# Patient Record
Sex: Male | Born: 1955 | Race: Black or African American | Hispanic: No | Marital: Married | Smoking: Former smoker
Health system: Southern US, Community
[De-identification: ages and names within clinical notes are randomized; demographics above are authoritative.]

## PROBLEM LIST (undated history)

## (undated) DIAGNOSIS — D649 Anemia, unspecified: Secondary | ICD-10-CM

## (undated) DIAGNOSIS — H401120 Primary open-angle glaucoma, left eye, stage unspecified: Secondary | ICD-10-CM

## (undated) DIAGNOSIS — M4712 Other spondylosis with myelopathy, cervical region: Secondary | ICD-10-CM

## (undated) DIAGNOSIS — R7303 Prediabetes: Secondary | ICD-10-CM

## (undated) DIAGNOSIS — G959 Disease of spinal cord, unspecified: Secondary | ICD-10-CM

## (undated) DIAGNOSIS — N390 Urinary tract infection, site not specified: Secondary | ICD-10-CM

## (undated) DIAGNOSIS — A419 Sepsis, unspecified organism: Secondary | ICD-10-CM

## (undated) DIAGNOSIS — B1920 Unspecified viral hepatitis C without hepatic coma: Secondary | ICD-10-CM

## (undated) DIAGNOSIS — I10 Essential (primary) hypertension: Secondary | ICD-10-CM

## (undated) DIAGNOSIS — M4012 Other secondary kyphosis, cervical region: Secondary | ICD-10-CM

## (undated) DIAGNOSIS — M47812 Spondylosis without myelopathy or radiculopathy, cervical region: Secondary | ICD-10-CM

## (undated) DIAGNOSIS — H409 Unspecified glaucoma: Secondary | ICD-10-CM

## (undated) DIAGNOSIS — E785 Hyperlipidemia, unspecified: Secondary | ICD-10-CM

## (undated) DIAGNOSIS — S129XXA Fracture of neck, unspecified, initial encounter: Secondary | ICD-10-CM

## (undated) DIAGNOSIS — T7840XA Allergy, unspecified, initial encounter: Secondary | ICD-10-CM

## (undated) DIAGNOSIS — E119 Type 2 diabetes mellitus without complications: Secondary | ICD-10-CM

## (undated) DIAGNOSIS — N400 Enlarged prostate without lower urinary tract symptoms: Secondary | ICD-10-CM

## (undated) DIAGNOSIS — Z87442 Personal history of urinary calculi: Secondary | ICD-10-CM

## (undated) HISTORY — PX: KNEE SURGERY: SHX244

## (undated) HISTORY — DX: Type 2 diabetes mellitus without complications: E11.9

## (undated) HISTORY — DX: Prediabetes: R73.03

## (undated) HISTORY — DX: Unspecified viral hepatitis C without hepatic coma: B19.20

## (undated) HISTORY — DX: Allergy, unspecified, initial encounter: T78.40XA

## (undated) HISTORY — DX: Unspecified glaucoma: H40.9

---

## 2000-02-20 HISTORY — PX: KNEE SURGERY: SHX244

## 2000-10-03 ENCOUNTER — Emergency Department (HOSPITAL_COMMUNITY): Admission: EM | Admit: 2000-10-03 | Discharge: 2000-10-04 | Payer: Self-pay | Admitting: Emergency Medicine

## 2011-11-28 LAB — HM DIABETES EYE EXAM

## 2013-04-23 DIAGNOSIS — E119 Type 2 diabetes mellitus without complications: Secondary | ICD-10-CM | POA: Insufficient documentation

## 2013-04-23 LAB — TSH: TSH: 0.94 u[IU]/mL (ref <=5.90)

## 2013-04-23 LAB — BASIC METABOLIC PANEL
BUN: 16 mg/dL (ref 4–21)
Creatinine: 0.9 mg/dL (ref 0.6–1.3)
Glucose: 91 mg/dL
Sodium: 141 mmol/L (ref 137–147)

## 2013-04-23 LAB — HEPATIC FUNCTION PANEL
ALT: 33 U/L (ref 10–40)
AST: 27 U/L (ref 14–40)
Alkaline Phosphatase: 51 U/L (ref 25–125)
Bilirubin, Total: 0.3 mg/dL

## 2013-04-23 LAB — CBC AND DIFFERENTIAL
NEUTROS ABS: 4 /uL
WBC: 7.9 10^3/mL

## 2013-04-23 LAB — PSA: PSA: 0.1

## 2013-08-11 LAB — LIPID PANEL
Cholesterol: 132 mg/dL (ref 0–200)
HDL: 43 mg/dL (ref 35–70)
LDL CALC: 69 mg/dL
Triglycerides: 101 mg/dL (ref 40–160)

## 2013-08-20 ENCOUNTER — Ambulatory Visit: Payer: Self-pay

## 2014-02-23 DIAGNOSIS — N400 Enlarged prostate without lower urinary tract symptoms: Secondary | ICD-10-CM | POA: Insufficient documentation

## 2014-02-23 DIAGNOSIS — I10 Essential (primary) hypertension: Secondary | ICD-10-CM | POA: Insufficient documentation

## 2014-02-23 DIAGNOSIS — B070 Plantar wart: Secondary | ICD-10-CM | POA: Insufficient documentation

## 2014-02-23 LAB — HEMOGLOBIN A1C: Hemoglobin A1C: 6.2

## 2014-04-20 ENCOUNTER — Emergency Department (HOSPITAL_COMMUNITY): Payer: Self-pay

## 2014-04-20 ENCOUNTER — Emergency Department (HOSPITAL_COMMUNITY)
Admission: EM | Admit: 2014-04-20 | Discharge: 2014-04-20 | Disposition: A | Discharge status: Home / Self Care | Payer: Self-pay | Attending: Emergency Medicine | Admitting: Emergency Medicine

## 2014-04-20 ENCOUNTER — Emergency Department: Payer: Self-pay | Admitting: Emergency Medicine

## 2014-04-20 ENCOUNTER — Encounter (HOSPITAL_COMMUNITY): Payer: Self-pay | Admitting: Emergency Medicine

## 2014-04-20 DIAGNOSIS — R5383 Other fatigue: Secondary | ICD-10-CM | POA: Insufficient documentation

## 2014-04-20 DIAGNOSIS — Z720 Tobacco use: Secondary | ICD-10-CM | POA: Insufficient documentation

## 2014-04-20 DIAGNOSIS — B9789 Other viral agents as the cause of diseases classified elsewhere: Secondary | ICD-10-CM

## 2014-04-20 DIAGNOSIS — J988 Other specified respiratory disorders: Secondary | ICD-10-CM

## 2014-04-20 DIAGNOSIS — J069 Acute upper respiratory infection, unspecified: Secondary | ICD-10-CM | POA: Insufficient documentation

## 2014-04-20 DIAGNOSIS — R531 Weakness: Secondary | ICD-10-CM | POA: Insufficient documentation

## 2014-04-20 DIAGNOSIS — I10 Essential (primary) hypertension: Secondary | ICD-10-CM | POA: Insufficient documentation

## 2014-04-20 HISTORY — DX: Essential (primary) hypertension: I10

## 2014-04-20 LAB — I-STAT CHEM 8, ED
BUN: 17 mg/dL (ref 6–23)
CREATININE: 1.2 mg/dL (ref 0.50–1.35)
Calcium, Ion: 1.19 mmol/L (ref 1.12–1.23)
Chloride: 101 mmol/L (ref 96–112)
Glucose, Bld: 137 mg/dL — ABNORMAL HIGH (ref 70–99)
HCT: 48 % (ref 39.0–52.0)
Hemoglobin: 16.3 g/dL (ref 13.0–17.0)
POTASSIUM: 3.6 mmol/L (ref 3.5–5.1)
Sodium: 139 mmol/L (ref 135–145)
TCO2: 23 mmol/L (ref 0–100)

## 2014-04-20 LAB — CBC WITH DIFFERENTIAL/PLATELET
Basophils Absolute: 0 10*3/uL (ref 0.0–0.1)
Basophils Relative: 0 % (ref 0–1)
EOS ABS: 0.2 10*3/uL (ref 0.0–0.7)
Eosinophils Relative: 2 % (ref 0–5)
HCT: 43.8 % (ref 39.0–52.0)
Hemoglobin: 14.7 g/dL (ref 13.0–17.0)
Lymphocytes Relative: 31 % (ref 12–46)
Lymphs Abs: 2.9 10*3/uL (ref 0.7–4.0)
MCH: 28.8 pg (ref 26.0–34.0)
MCHC: 33.6 g/dL (ref 30.0–36.0)
MCV: 85.7 fL (ref 78.0–100.0)
MONOS PCT: 7 % (ref 3–12)
Monocytes Absolute: 0.7 10*3/uL (ref 0.1–1.0)
NEUTROS ABS: 5.7 10*3/uL (ref 1.7–7.7)
NEUTROS PCT: 60 % (ref 43–77)
PLATELETS: 271 10*3/uL (ref 150–400)
RBC: 5.11 MIL/uL (ref 4.22–5.81)
RDW: 13.2 % (ref 11.5–15.5)
WBC: 9.5 10*3/uL (ref 4.0–10.5)

## 2014-04-20 MED ORDER — IBUPROFEN 600 MG PO TABS: 600.0000 mg | ORAL_TABLET | Freq: Four times a day (QID) | ORAL | Status: DC | PRN

## 2014-04-20 MED ORDER — DM-GUAIFENESIN ER 30-600 MG PO TB12: 1.0000 | ORAL_TABLET | Freq: Two times a day (BID) | ORAL | Status: DC

## 2014-04-20 MED ORDER — SALINE SPRAY 0.65 % NA SOLN: 1.0000 | NASAL | Status: DC | PRN

## 2014-04-20 MED ORDER — IBUPROFEN 200 MG PO TABS
600.0000 mg | ORAL_TABLET | Freq: Once | ORAL | Status: AC
  Administered 2014-04-20: 600 mg via ORAL
  Filled 2014-04-20: qty 3

## 2014-04-20 MED ORDER — GUAIFENESIN-CODEINE 100-10 MG/5ML PO SOLN
5.0000 mL | Freq: Once | ORAL | Status: AC
  Administered 2014-04-20: 5 mL via ORAL
  Filled 2014-04-20: qty 5

## 2014-04-20 NOTE — ED Provider Notes (Signed)
CSN: [id number]     Arrival date & time 04/20/14  1512 History   First MD Initiated Contact with Patient 04/20/14 1620     Chief Complaint  Patient presents with  . Generalized Body Aches     (Consider location/radiation/quality/duration/timing/severity/associated sxs/prior Treatment) HPI Pt is a 59yo male presenting to ED with c/o 4 days of "feeling bad" with associated body aches and productive cough with yellow sputum.  Pt states he started to feel bad last week, but yesterday he felt the worst with a coughing fit that caused him to have a sore throat and chest pain.  Chest pain has resolved, however, pt states he still feels fatigued and aching.  He tried Alkaseltzer yesterday w/o relief yesterday but does state he is feeling better today.  Reports using zyrtec and flonase daily for "haye fever" but states he feels sicker than his normal haye fever. Denies fever, chills, n/v/d. Denies sick contacts or recent travel. Reports having asthma as a child but has since grown out of it. He does not use an inhaler at home. No previous hx of CAD. No FH of CAD. No hx of DVT, PE, leg pain or swelling.  Pt is a current daily smoker.  Past Medical History  Diagnosis Date  . Hypertension    Past Surgical History  Procedure Laterality Date  . Knee surgery     No family history on file. History  Substance Use Topics  . Smoking status: Current Every Day Smoker  . Smokeless tobacco: Not on file  . Alcohol Use: No    Review of Systems  Constitutional: Positive for fatigue. Negative for fever, chills and appetite change.  HENT: Positive for congestion and sore throat. Negative for ear pain, trouble swallowing and voice change.   Respiratory: Positive for cough. Negative for shortness of breath and stridor.   Cardiovascular: Positive for chest pain ( with cough). Negative for palpitations and leg swelling.  Gastrointestinal: Negative for nausea, vomiting, abdominal pain, diarrhea and constipation.   Neurological: Positive for weakness ( generalized).  All other systems reviewed and are negative.     Allergies  Review of patient's allergies indicates not on file.  Home Medications   Prior to Admission medications   Medication Sig Start Date End Date Taking? Authorizing Provider  dextromethorphan-guaiFENesin (MUCINEX DM) 30-600 MG per 12 hr tablet Take 1 tablet by mouth 2 (two) times daily. 04/20/14   Noland Fordyce, PA-C  ibuprofen (ADVIL,MOTRIN) 600 MG tablet Take 1 tablet (600 mg total) by mouth every 6 (six) hours as needed. 04/20/14   Noland Fordyce, PA-C  sodium chloride (OCEAN) 0.65 % SOLN nasal spray Place 1 spray into both nostrils as needed for congestion. 04/20/14   Noland Fordyce, PA-C   BP 126/82 mmHg  Pulse 89  Temp(Src) 98.5 F (36.9 C) (Oral)  Resp 18  SpO2 100% Physical Exam  Constitutional: He appears well-developed and well-nourished.  Pt lying comfortably in exam bed, NAD. Non-toxic appearing.  HENT:  Head: Normocephalic and atraumatic.  Right Ear: Hearing, tympanic membrane, external ear and ear canal normal.  Left Ear: Hearing, tympanic membrane, external ear and ear canal normal.  Nose: Mucosal edema present. Right sinus exhibits no maxillary sinus tenderness and no frontal sinus tenderness. Left sinus exhibits no maxillary sinus tenderness and no frontal sinus tenderness.  Mouth/Throat: Uvula is midline and mucous membranes are normal. Posterior oropharyngeal erythema present. No oropharyngeal exudate or posterior oropharyngeal edema.  Eyes: Conjunctivae are normal. No scleral icterus.  Neck:  Normal range of motion. Neck supple.  No nuchal rigidity or meningeal signs.  Cardiovascular: Normal rate, regular rhythm and normal heart sounds.   Pulmonary/Chest: Effort normal and breath sounds normal. No respiratory distress. He has no wheezes. He has no rales. He exhibits no tenderness.  No respiratory distress, able to speak in full sentences w/o difficulty. Lungs:  CTAB  Abdominal: Soft. Bowel sounds are normal. He exhibits no distension and no mass. There is no tenderness. There is no rebound and no guarding.  Musculoskeletal: Normal range of motion.  Neurological: He is alert.  Skin: Skin is warm and dry.  Nursing note and vitals reviewed.   ED Course  Procedures (including critical care time) Labs Review Labs Reviewed  I-STAT CHEM 8, ED - Abnormal; Notable for the following:    Glucose, Bld 137 (*)    All other components within normal limits  CBC WITH DIFFERENTIAL/PLATELET    Imaging Review Dg Chest 2 View  04/20/2014   CLINICAL DATA:  Productive cough.  EXAM: CHEST  2 VIEW  COMPARISON:  None.  FINDINGS: The lungs are clear. Heart size is normal. No pneumothorax or pleural effusion.  IMPRESSION: No acute disease.   Electronically Signed   By: Inge Rise M.D.   On: 04/20/2014 16:36     EKG Interpretation None      MDM   Final diagnoses:  Viral respiratory illness   Pt presenting to ED with URI symptoms that started 4 days ago. Pt reports body aches, productive cough, and fatigue.  Chest pain and sore throat were present during coughing fit yesterday but has since resolved.  Pt appears well, non-toxic. Able to speak in full sentences. Lungs are clear. CXR: no acute disease. O2-100% on RA. PERC negative, Doubt ACS. No evidence of pneumonia or pneumothorax on CXR.  Pt is afebrile, no meningeal signs. Pt able to keep down PO fluids in ED. Will tx symptomatically for viral illness. Pt stable for discharge home. Work note provided. Advised pt to use acetaminophen and ibuprofen as needed for fever and pain. Rx: mucinex DM and ocean saline nasal spray. Encouraged rest and fluids. Advised to f/u with PCP at [facility] in 3-4 days for recheck of symptoms if not improving. Return precautions provided.  Pt verbalized understanding and agreement with tx plan.    Noland Fordyce, PA-C 04/20/14 [place], PA-C 04/20/14 1713  Pamella Pert, MD 04/22/14 1047

## 2014-04-20 NOTE — ED Notes (Addendum)
Pt st's he started feeling bad 4 days ago.  Pt c/o productive cough (yellow), body aches.  No elevated temp. Also c/o feeling fatigued.

## 2014-07-01 ENCOUNTER — Ambulatory Visit: Payer: Self-pay | Admitting: Ophthalmology

## 2015-06-17 DIAGNOSIS — I10 Essential (primary) hypertension: Secondary | ICD-10-CM

## 2015-06-17 DIAGNOSIS — B070 Plantar wart: Secondary | ICD-10-CM

## 2015-06-17 DIAGNOSIS — E119 Type 2 diabetes mellitus without complications: Secondary | ICD-10-CM

## 2015-06-17 DIAGNOSIS — N400 Enlarged prostate without lower urinary tract symptoms: Secondary | ICD-10-CM

## 2019-02-05 HISTORY — PX: CATARACT EXTRACTION W/ INTRAOCULAR LENS IMPLANT: SHX1309

## 2019-03-05 ENCOUNTER — Other Ambulatory Visit: Payer: Self-pay

## 2019-12-21 HISTORY — PX: BROW LIFT AND BLEPHAROPLASTY: SHX1271

## 2020-02-20 HISTORY — PX: JOINT REPLACEMENT: SHX530

## 2020-03-06 HISTORY — PX: ORIF FEMORAL NECK FRACTURE W/ DHS: SUR930

## 2020-03-06 HISTORY — PX: TOTAL HIP ARTHROPLASTY: SHX124

## 2020-10-25 ENCOUNTER — Other Ambulatory Visit: Payer: Self-pay | Admitting: Neurosurgery

## 2020-10-25 ENCOUNTER — Other Ambulatory Visit (HOSPITAL_COMMUNITY): Payer: Self-pay | Admitting: Neurosurgery

## 2020-10-25 DIAGNOSIS — M4802 Spinal stenosis, cervical region: Secondary | ICD-10-CM

## 2020-11-01 ENCOUNTER — Ambulatory Visit
Admission: RE | Admit: 2020-11-01 | Discharge: 2020-11-01 | Disposition: A | Discharge status: Home / Self Care | Payer: 59 | Source: Ambulatory Visit | Attending: Neurosurgery | Admitting: Neurosurgery

## 2020-11-01 ENCOUNTER — Other Ambulatory Visit: Payer: Self-pay

## 2020-11-01 DIAGNOSIS — M4802 Spinal stenosis, cervical region: Secondary | ICD-10-CM | POA: Diagnosis not present

## 2020-12-07 ENCOUNTER — Other Ambulatory Visit: Payer: Self-pay | Admitting: Neurosurgery

## 2020-12-13 HISTORY — PX: LITHOTRIPSY: SUR834

## 2020-12-15 ENCOUNTER — Other Ambulatory Visit
Admission: RE | Admit: 2020-12-15 | Discharge: 2020-12-15 | Disposition: A | Discharge status: Home / Self Care | Payer: 59 | Source: Ambulatory Visit | Attending: Neurosurgery | Admitting: Neurosurgery

## 2020-12-15 ENCOUNTER — Other Ambulatory Visit: Payer: Self-pay

## 2020-12-15 VITALS — BP 138/90 | HR 78 | Temp 97.6°F | Resp 16 | Ht 66.0 in | Wt 250.1 lb

## 2020-12-15 DIAGNOSIS — I10 Essential (primary) hypertension: Secondary | ICD-10-CM | POA: Diagnosis not present

## 2020-12-15 DIAGNOSIS — Z01810 Encounter for preprocedural cardiovascular examination: Secondary | ICD-10-CM | POA: Diagnosis not present

## 2020-12-15 DIAGNOSIS — Z01818 Encounter for other preprocedural examination: Secondary | ICD-10-CM | POA: Diagnosis not present

## 2020-12-15 DIAGNOSIS — Z01812 Encounter for preprocedural laboratory examination: Secondary | ICD-10-CM

## 2020-12-15 LAB — CBC
HCT: 34.4 % — ABNORMAL LOW (ref 39.0–52.0)
Hemoglobin: 11.5 g/dL — ABNORMAL LOW (ref 13.0–17.0)
MCH: 29.3 pg (ref 26.0–34.0)
MCHC: 33.4 g/dL (ref 30.0–36.0)
MCV: 87.5 fL (ref 80.0–100.0)
Platelets: 204 10*3/uL (ref 150–400)
RBC: 3.93 MIL/uL — ABNORMAL LOW (ref 4.22–5.81)
RDW: 16.2 % — ABNORMAL HIGH (ref 11.5–15.5)
WBC: 6.7 10*3/uL (ref 4.0–10.5)
nRBC: 0 % (ref 0.0–0.2)

## 2020-12-15 LAB — URINALYSIS, ROUTINE W REFLEX MICROSCOPIC
Bilirubin Urine: NEGATIVE
Glucose, UA: NEGATIVE mg/dL
Ketones, ur: NEGATIVE mg/dL
Nitrite: NEGATIVE
Protein, ur: 30 mg/dL — AB
RBC / HPF: >50 RBC/hpf — ABNORMAL HIGH (ref 0–5)
Specific Gravity, Urine: 1.017 (ref 1.005–1.030)
WBC, UA: >50 WBC/hpf — ABNORMAL HIGH (ref 0–5)
pH: 6 (ref 5.0–8.0)

## 2020-12-15 LAB — BASIC METABOLIC PANEL
Anion gap: 9 (ref 5–15)
BUN: 13 mg/dL (ref 8–23)
CO2: 24 mmol/L (ref 22–32)
Calcium: 8.9 mg/dL (ref 8.9–10.3)
Chloride: 104 mmol/L (ref 98–111)
Creatinine, Ser: 0.95 mg/dL (ref 0.61–1.24)
GFR, Estimated: >60 mL/min (ref >60)
Glucose, Bld: 127 mg/dL — ABNORMAL HIGH (ref 70–99)
Potassium: 3.4 mmol/L — ABNORMAL LOW (ref 3.5–5.1)
Sodium: 137 mmol/L (ref 135–145)

## 2020-12-15 LAB — TYPE AND SCREEN
ABO/RH(D): AB POS
Antibody Screen: NEGATIVE

## 2020-12-15 LAB — SURGICAL PCR SCREEN
MRSA, PCR: NEGATIVE
Staphylococcus aureus: NEGATIVE

## 2020-12-15 LAB — APTT: aPTT: 31 seconds (ref 24–36)

## 2020-12-15 LAB — PROTIME-INR
INR: 1 (ref 0.8–1.2)
Prothrombin Time: 13.5 seconds (ref 11.4–15.2)

## 2020-12-15 NOTE — Patient Instructions (Signed)
Your procedure is scheduled on: Monday December 26, 2020. Report to Day Surgery inside [facility] 2nd floor. To find out your arrival time please call [phone] between 1PM - 3PM on Friday December 23, 2020.  Remember: Instructions that are not followed completely may result in serious medical risk,  up to and including death, or upon the discretion of your surgeon and anesthesiologist your  surgery may need to be rescheduled.     _X__ 1. Do not eat food after midnight the night before your procedure.                 No chewing gum or hard candies. You may drink clear liquids up to 2 hours                 before you are scheduled to arrive for your surgery- DO not drink clear                 liquids within 2 hours of the start of your surgery.                 Clear Liquids include:  water, apple juice without pulp, clear Gatorade, G2 or                  Gatorade Zero (avoid Red/Purple/Blue), Black Coffee or Tea (Do not add                 anything to coffee or tea).  __X__2.  On the morning of surgery brush your teeth with toothpaste and water, you                may rinse your mouth with mouthwash if you wish.  Do not swallow any toothpaste of mouthwash.     _X__ 3.  No Alcohol for 24 hours before or after surgery.   _X__ 4.  Do Not Smoke or use e-cigarettes For 24 Hours Prior to Your Surgery.                 Do not use any chewable tobacco products for at least 6 hours prior to                 Surgery.  _X__  5.  Do not use any recreational drugs (marijuana, cocaine, heroin, ecstasy, MDMA or other)                For at least one week prior to your surgery.  Combination of these drugs with anesthesia                May have life threatening results.  __X__6.  Notify your doctor if there is any change in your medical condition      (cold, fever, infections).     Do not wear jewelry, make-up, hairpins, clips or nail polish. Do not wear lotions, powders, or  perfumes. You may wear deodorant. Do not shave 48 hours prior to surgery. Men may shave face and neck. Do not bring valuables to the hospital.    [facility] is not responsible for any belongings or valuables.  Contacts, dentures or bridgework may not be worn into surgery. Leave your suitcase in the car. After surgery it may be brought to your room. For patients admitted to the hospital, discharge time is determined by your treatment team.   Patients discharged the day of surgery will not be allowed to drive home.   Make arrangements for someone to be with  you for the first 24 hours of your Same Day Discharge.   __X__ Take these medicines the morning of surgery with A SIP OF WATER:    1. finasteride (PROSCAR) 5 MG  2.   3.   4.  5.  6.  ____ Fleet Enema (as directed)   __X__ Use CHG Soap (or wipes) as directed  ____ Use Benzoyl Peroxide Gel as instructed  ____ Use inhalers on the day of surgery  ____ Stop metformin 2 days prior to surgery    ____ Take 1/2 of usual insulin dose the night before surgery. No insulin the morning          of surgery.   ____ Call your PCP, cardiologist, or Pulmonologist if taking Coumadin/Plavix/aspirin and ask when to stop before your surgery.   __X__ One Week prior to surgery- Stop Anti-inflammatories such as Ibuprofen, Aleve, Advil, Motrin, meloxicam (MOBIC), diclofenac, etodolac, ketorolac, Toradol, Daypro, piroxicam, Goody's or BC powders. OK TO USE TYLENOL IF NEEDED   __X__ Do not start any supplements before your surgery.    ____ Bring C-Pap to the hospital.    If you have any questions regarding your pre-procedure instructions,  Please call Pre-admit Testing at [phone]

## 2020-12-16 LAB — URINE CULTURE: Culture: NO GROWTH

## 2020-12-22 ENCOUNTER — Other Ambulatory Visit: Payer: Self-pay

## 2020-12-22 ENCOUNTER — Other Ambulatory Visit
Admission: RE | Admit: 2020-12-22 | Discharge: 2020-12-22 | Disposition: A | Discharge status: Home / Self Care | Payer: 59 | Source: Ambulatory Visit | Attending: Neurosurgery | Admitting: Neurosurgery

## 2020-12-22 DIAGNOSIS — Z20822 Contact with and (suspected) exposure to covid-19: Secondary | ICD-10-CM | POA: Diagnosis not present

## 2020-12-22 DIAGNOSIS — Z01812 Encounter for preprocedural laboratory examination: Secondary | ICD-10-CM | POA: Insufficient documentation

## 2020-12-23 LAB — SARS CORONAVIRUS 2 (TAT 6-24 HRS): SARS Coronavirus 2: NEGATIVE

## 2020-12-26 ENCOUNTER — Ambulatory Visit: Payer: 59

## 2020-12-26 ENCOUNTER — Ambulatory Visit: Payer: 59 | Admitting: Urgent Care

## 2020-12-26 ENCOUNTER — Encounter: Payer: Self-pay | Admitting: Neurosurgery

## 2020-12-26 ENCOUNTER — Encounter
Admission: RE | Disposition: A | Discharge status: Home / Self Care | Payer: Self-pay | Source: Home / Self Care | Attending: Neurosurgery

## 2020-12-26 ENCOUNTER — Observation Stay
Admission: RE | Admit: 2020-12-26 | Discharge: 2020-12-27 | Disposition: A | Discharge status: Home / Self Care | Payer: 59 | Attending: Neurosurgery | Admitting: Neurosurgery

## 2020-12-26 ENCOUNTER — Other Ambulatory Visit: Payer: Self-pay

## 2020-12-26 DIAGNOSIS — M4802 Spinal stenosis, cervical region: Principal | ICD-10-CM | POA: Insufficient documentation

## 2020-12-26 DIAGNOSIS — Z87891 Personal history of nicotine dependence: Secondary | ICD-10-CM | POA: Insufficient documentation

## 2020-12-26 DIAGNOSIS — Z79899 Other long term (current) drug therapy: Secondary | ICD-10-CM | POA: Diagnosis not present

## 2020-12-26 DIAGNOSIS — M5001 Cervical disc disorder with myelopathy,  high cervical region: Secondary | ICD-10-CM | POA: Diagnosis not present

## 2020-12-26 DIAGNOSIS — G959 Disease of spinal cord, unspecified: Secondary | ICD-10-CM | POA: Diagnosis present

## 2020-12-26 DIAGNOSIS — R7303 Prediabetes: Secondary | ICD-10-CM | POA: Diagnosis not present

## 2020-12-26 DIAGNOSIS — I10 Essential (primary) hypertension: Secondary | ICD-10-CM | POA: Diagnosis not present

## 2020-12-26 DIAGNOSIS — Z419 Encounter for procedure for purposes other than remedying health state, unspecified: Secondary | ICD-10-CM

## 2020-12-26 HISTORY — PX: ANTERIOR CERVICAL DECOMPRESSION/DISCECTOMY FUSION 4 LEVELS: SHX5556

## 2020-12-26 LAB — ABO/RH: ABO/RH(D): AB POS

## 2020-12-26 SURGERY — ANTERIOR CERVICAL DECOMPRESSION/DISCECTOMY FUSION 4 LEVELS
Anesthesia: General | Site: Back

## 2020-12-26 MED ORDER — LIDOCAINE HCL (CARDIAC) PF 100 MG/5ML IV SOSY
PREFILLED_SYRINGE | INTRAVENOUS | Status: DC | PRN
  Administered 2020-12-26: 100 mg via INTRAVENOUS

## 2020-12-26 MED ORDER — LACTATED RINGERS IV SOLN: INTRAVENOUS | Status: DC

## 2020-12-26 MED ORDER — ORAL CARE MOUTH RINSE: 15.0000 mL | Freq: Once | OROMUCOSAL | Status: AC

## 2020-12-26 MED ORDER — LATANOPROST 0.005 % OP SOLN
1.0000 [drp] | Freq: Every day | OPHTHALMIC | Status: DC
  Administered 2020-12-26: 1 [drp] via OPHTHALMIC
  Filled 2020-12-26: qty 2.5

## 2020-12-26 MED ORDER — LISINOPRIL 20 MG PO TABS
40.0000 mg | ORAL_TABLET | Freq: Every day | ORAL | Status: DC
  Administered 2020-12-27: 40 mg via ORAL
  Filled 2020-12-26 (×2): qty 2

## 2020-12-26 MED ORDER — MIDAZOLAM HCL 2 MG/2ML IJ SOLN
INTRAMUSCULAR | Status: AC
  Filled 2020-12-26: qty 2

## 2020-12-26 MED ORDER — LIDOCAINE-EPINEPHRINE 1 %-1:100000 IJ SOLN
INTRAMUSCULAR | Status: DC | PRN
  Administered 2020-12-26: 6 mL

## 2020-12-26 MED ORDER — MORPHINE SULFATE (PF) 4 MG/ML IV SOLN
INTRAVENOUS | Status: AC
  Administered 2020-12-26: 2 mg
  Filled 2020-12-26: qty 1

## 2020-12-26 MED ORDER — FAMOTIDINE 20 MG PO TABS: 20.0000 mg | ORAL_TABLET | Freq: Once | ORAL | Status: AC

## 2020-12-26 MED ORDER — SUCCINYLCHOLINE CHLORIDE 200 MG/10ML IV SOSY
PREFILLED_SYRINGE | INTRAVENOUS | Status: AC
  Filled 2020-12-26: qty 10

## 2020-12-26 MED ORDER — SODIUM CHLORIDE 0.9 % IV SOLN: INTRAVENOUS | Status: DC

## 2020-12-26 MED ORDER — CELECOXIB 200 MG PO CAPS: 200.0000 mg | ORAL_CAPSULE | Freq: Two times a day (BID) | ORAL | Status: DC

## 2020-12-26 MED ORDER — FENTANYL CITRATE (PF) 100 MCG/2ML IJ SOLN
INTRAMUSCULAR | Status: AC
  Administered 2020-12-26: 25 ug via INTRAVENOUS
  Filled 2020-12-26: qty 2

## 2020-12-26 MED ORDER — SODIUM CHLORIDE 0.9% FLUSH: 3.0000 mL | INTRAVENOUS | Status: DC | PRN

## 2020-12-26 MED ORDER — SODIUM CHLORIDE 0.9 % IV SOLN
250.0000 mL | INTRAVENOUS | Status: DC
  Administered 2020-12-27: 250 mL via INTRAVENOUS

## 2020-12-26 MED ORDER — MIDAZOLAM HCL 2 MG/2ML IJ SOLN
INTRAMUSCULAR | Status: DC | PRN
  Administered 2020-12-26: 2 mg via INTRAVENOUS

## 2020-12-26 MED ORDER — LABETALOL HCL 5 MG/ML IV SOLN
INTRAVENOUS | Status: AC
  Administered 2020-12-26: 10 mg via INTRAVENOUS
  Filled 2020-12-26: qty 4

## 2020-12-26 MED ORDER — PROPOFOL 10 MG/ML IV BOLUS
INTRAVENOUS | Status: AC
  Filled 2020-12-26: qty 20

## 2020-12-26 MED ORDER — OXYCODONE HCL 5 MG PO TABS
10.0000 mg | ORAL_TABLET | ORAL | Status: DC | PRN
  Administered 2020-12-27 (×2): 10 mg via ORAL

## 2020-12-26 MED ORDER — CEFAZOLIN SODIUM-DEXTROSE 2-4 GM/100ML-% IV SOLN
INTRAVENOUS | Status: AC
  Filled 2020-12-26: qty 100

## 2020-12-26 MED ORDER — REMIFENTANIL HCL 1 MG IV SOLR
INTRAVENOUS | Status: AC
  Filled 2020-12-26: qty 1000

## 2020-12-26 MED ORDER — MENTHOL 3 MG MT LOZG
1.0000 | LOZENGE | OROMUCOSAL | Status: DC | PRN
  Administered 2020-12-27: 3 mg via ORAL
  Filled 2020-12-26 (×2): qty 9

## 2020-12-26 MED ORDER — GLYCOPYRROLATE 0.2 MG/ML IJ SOLN
INTRAMUSCULAR | Status: DC | PRN
  Administered 2020-12-26: .2 mg via INTRAVENOUS

## 2020-12-26 MED ORDER — FAMOTIDINE 20 MG PO TABS
ORAL_TABLET | ORAL | Status: AC
  Administered 2020-12-26: 20 mg via ORAL
  Filled 2020-12-26: qty 1

## 2020-12-26 MED ORDER — OXYCODONE HCL 5 MG PO TABS
ORAL_TABLET | ORAL | Status: AC
  Administered 2020-12-26: 10 mg via ORAL
  Filled 2020-12-26: qty 2

## 2020-12-26 MED ORDER — HEMOSTATIC AGENTS (NO CHARGE) OPTIME
TOPICAL | Status: DC | PRN
  Administered 2020-12-26: 1 via TOPICAL

## 2020-12-26 MED ORDER — ACETAMINOPHEN 10 MG/ML IV SOLN
INTRAVENOUS | Status: DC | PRN
Start: 2020-12-26 — End: 2020-12-26
  Administered 2020-12-26: 1000 mg via INTRAVENOUS

## 2020-12-26 MED ORDER — MORPHINE SULFATE (PF) 2 MG/ML IV SOLN
2.0000 mg | INTRAVENOUS | Status: DC | PRN
  Administered 2020-12-26: 2 mg via INTRAVENOUS

## 2020-12-26 MED ORDER — OXYBUTYNIN CHLORIDE 5 MG PO TABS
5.0000 mg | ORAL_TABLET | Freq: Three times a day (TID) | ORAL | Status: DC
  Administered 2020-12-27: 5 mg via ORAL

## 2020-12-26 MED ORDER — LIDOCAINE 5 % EX PTCH
1.0000 | MEDICATED_PATCH | CUTANEOUS | Status: DC
Start: 2020-12-26 — End: 2020-12-27
  Administered 2020-12-26: 1 via TRANSDERMAL
  Filled 2020-12-26 (×2): qty 1

## 2020-12-26 MED ORDER — SENNOSIDES-DOCUSATE SODIUM 8.6-50 MG PO TABS
1.0000 | ORAL_TABLET | Freq: Every evening | ORAL | Status: DC | PRN
  Filled 2020-12-26 (×2): qty 1

## 2020-12-26 MED ORDER — ACETAMINOPHEN 500 MG PO TABS
ORAL_TABLET | ORAL | Status: AC
  Administered 2020-12-26: 1000 mg via ORAL
  Filled 2020-12-26: qty 2

## 2020-12-26 MED ORDER — FLEET ENEMA 7-19 GM/118ML RE ENEM: 1.0000 | ENEMA | Freq: Once | RECTAL | Status: DC | PRN

## 2020-12-26 MED ORDER — FINASTERIDE 5 MG PO TABS
5.0000 mg | ORAL_TABLET | Freq: Every day | ORAL | Status: DC
  Administered 2020-12-27: 5 mg via ORAL
  Filled 2020-12-26 (×2): qty 1

## 2020-12-26 MED ORDER — CHLORHEXIDINE GLUCONATE 0.12 % MT SOLN: 15.0000 mL | Freq: Once | OROMUCOSAL | Status: AC

## 2020-12-26 MED ORDER — FENTANYL CITRATE (PF) 100 MCG/2ML IJ SOLN
25.0000 ug | INTRAMUSCULAR | Status: AC | PRN
  Administered 2020-12-26 (×4): 25 ug via INTRAVENOUS

## 2020-12-26 MED ORDER — MORPHINE SULFATE (PF) 2 MG/ML IV SOLN
INTRAVENOUS | Status: AC
  Administered 2020-12-27: 2 mg via INTRAVENOUS
  Filled 2020-12-26: qty 1

## 2020-12-26 MED ORDER — PROPOFOL 1000 MG/100ML IV EMUL
INTRAVENOUS | Status: AC
  Filled 2020-12-26: qty 100

## 2020-12-26 MED ORDER — HYDRALAZINE HCL 20 MG/ML IJ SOLN: 10.0000 mg | Freq: Once | INTRAMUSCULAR | Status: AC

## 2020-12-26 MED ORDER — ONDANSETRON HCL 4 MG/2ML IJ SOLN: 4.0000 mg | Freq: Four times a day (QID) | INTRAMUSCULAR | Status: DC | PRN

## 2020-12-26 MED ORDER — METHOCARBAMOL 500 MG PO TABS: 1000.0000 mg | ORAL_TABLET | Freq: Four times a day (QID) | ORAL | Status: DC | PRN

## 2020-12-26 MED ORDER — HYDRALAZINE HCL 20 MG/ML IJ SOLN
INTRAMUSCULAR | Status: AC
  Administered 2020-12-26: 10 mg via INTRAVENOUS
  Filled 2020-12-26: qty 1

## 2020-12-26 MED ORDER — LABETALOL HCL 5 MG/ML IV SOLN
10.0000 mg | Freq: Once | INTRAVENOUS | Status: AC
  Administered 2020-12-26: 10 mg via INTRAVENOUS

## 2020-12-26 MED ORDER — PREDNISOLONE ACETATE 1 % OP SUSP
1.0000 [drp] | Freq: Two times a day (BID) | OPHTHALMIC | Status: DC
  Administered 2020-12-26 – 2020-12-27 (×2): 1 [drp] via OPHTHALMIC
  Filled 2020-12-26: qty 1

## 2020-12-26 MED ORDER — SODIUM CHLORIDE (PF) 0.9 % IJ SOLN
INTRAMUSCULAR | Status: AC
  Filled 2020-12-26: qty 20

## 2020-12-26 MED ORDER — DORZOLAMIDE HCL-TIMOLOL MAL 2-0.5 % OP SOLN
1.0000 [drp] | Freq: Two times a day (BID) | OPHTHALMIC | Status: DC
  Administered 2020-12-26 – 2020-12-27 (×2): 1 [drp] via OPHTHALMIC
  Filled 2020-12-26: qty 10

## 2020-12-26 MED ORDER — CEFAZOLIN SODIUM-DEXTROSE 2-4 GM/100ML-% IV SOLN
2.0000 g | Freq: Once | INTRAVENOUS | Status: AC
  Administered 2020-12-26: 2 g via INTRAVENOUS

## 2020-12-26 MED ORDER — PHENOL 1.4 % MT LIQD
1.0000 | OROMUCOSAL | Status: DC | PRN
  Filled 2020-12-26: qty 177

## 2020-12-26 MED ORDER — METHOCARBAMOL 1000 MG/10ML IJ SOLN
500.0000 mg | Freq: Four times a day (QID) | INTRAVENOUS | Status: DC | PRN
  Administered 2020-12-26: 500 mg via INTRAVENOUS
  Filled 2020-12-26 (×2): qty 5

## 2020-12-26 MED ORDER — METHOCARBAMOL 500 MG PO TABS: 500.0000 mg | ORAL_TABLET | Freq: Four times a day (QID) | ORAL | Status: DC | PRN

## 2020-12-26 MED ORDER — FENTANYL CITRATE (PF) 100 MCG/2ML IJ SOLN
INTRAMUSCULAR | Status: AC
  Filled 2020-12-26: qty 2

## 2020-12-26 MED ORDER — LIDOCAINE HCL (PF) 2 % IJ SOLN
INTRAMUSCULAR | Status: AC
  Filled 2020-12-26: qty 5

## 2020-12-26 MED ORDER — CHLORHEXIDINE GLUCONATE 0.12 % MT SOLN
OROMUCOSAL | Status: AC
  Administered 2020-12-26: 15 mL via OROMUCOSAL
  Filled 2020-12-26: qty 15

## 2020-12-26 MED ORDER — LIDOCAINE-EPINEPHRINE 1 %-1:100000 IJ SOLN
INTRAMUSCULAR | Status: AC
  Filled 2020-12-26: qty 1

## 2020-12-26 MED ORDER — MORPHINE SULFATE (PF) 2 MG/ML IV SOLN
INTRAVENOUS | Status: AC
  Administered 2020-12-26: 2 mg via INTRAVENOUS
  Filled 2020-12-26: qty 1

## 2020-12-26 MED ORDER — HYDRALAZINE HCL 20 MG/ML IJ SOLN
10.0000 mg | Freq: Once | INTRAMUSCULAR | Status: AC
  Administered 2020-12-26: 10 mg via INTRAVENOUS

## 2020-12-26 MED ORDER — OXYCODONE HCL 5 MG PO TABS: 5.0000 mg | ORAL_TABLET | ORAL | Status: DC | PRN

## 2020-12-26 MED ORDER — REMIFENTANIL HCL 1 MG IV SOLR
INTRAVENOUS | Status: DC | PRN
Start: 2020-12-26 — End: 2020-12-26
  Administered 2020-12-26: .1 ug/kg/min via INTRAVENOUS

## 2020-12-26 MED ORDER — BRIMONIDINE TARTRATE 0.2 % OP SOLN
1.0000 [drp] | Freq: Two times a day (BID) | OPHTHALMIC | Status: DC
  Administered 2020-12-27: 1 [drp] via OPHTHALMIC
  Filled 2020-12-26: qty 5

## 2020-12-26 MED ORDER — DEXAMETHASONE SODIUM PHOSPHATE 10 MG/ML IJ SOLN
INTRAMUSCULAR | Status: AC
  Filled 2020-12-26: qty 1

## 2020-12-26 MED ORDER — SUCCINYLCHOLINE CHLORIDE 200 MG/10ML IV SOSY
PREFILLED_SYRINGE | INTRAVENOUS | Status: DC | PRN
  Administered 2020-12-26: 100 mg via INTRAVENOUS

## 2020-12-26 MED ORDER — 0.9 % SODIUM CHLORIDE (POUR BTL) OPTIME
TOPICAL | Status: DC | PRN
  Administered 2020-12-26: 1000 mL

## 2020-12-26 MED ORDER — METHOCARBAMOL 1000 MG/10ML IJ SOLN
500.0000 mg | Freq: Four times a day (QID) | INTRAVENOUS | Status: DC | PRN
  Filled 2020-12-26: qty 5

## 2020-12-26 MED ORDER — FENTANYL CITRATE (PF) 100 MCG/2ML IJ SOLN
INTRAMUSCULAR | Status: DC | PRN
  Administered 2020-12-26 (×2): 50 ug via INTRAVENOUS

## 2020-12-26 MED ORDER — ONDANSETRON HCL 4 MG/2ML IJ SOLN
INTRAMUSCULAR | Status: DC | PRN
  Administered 2020-12-26: 4 mg via INTRAVENOUS

## 2020-12-26 MED ORDER — ACETAMINOPHEN 10 MG/ML IV SOLN
INTRAVENOUS | Status: AC
  Filled 2020-12-26: qty 100

## 2020-12-26 MED ORDER — LACTATED RINGERS IV SOLN
INTRAVENOUS | Status: DC | PRN
Start: 2020-12-26 — End: 2020-12-26

## 2020-12-26 MED ORDER — LABETALOL HCL 5 MG/ML IV SOLN: 10.0000 mg | Freq: Once | INTRAVENOUS | Status: AC

## 2020-12-26 MED ORDER — PHENYLEPHRINE HCL (PRESSORS) 10 MG/ML IV SOLN
INTRAVENOUS | Status: AC
  Filled 2020-12-26: qty 2

## 2020-12-26 MED ORDER — DEXAMETHASONE SODIUM PHOSPHATE 10 MG/ML IJ SOLN
INTRAMUSCULAR | Status: DC | PRN
  Administered 2020-12-26: 10 mg via INTRAVENOUS

## 2020-12-26 MED ORDER — ONDANSETRON HCL 4 MG PO TABS: 4.0000 mg | ORAL_TABLET | Freq: Four times a day (QID) | ORAL | Status: DC | PRN

## 2020-12-26 MED ORDER — PROMETHAZINE HCL 25 MG/ML IJ SOLN: 6.2500 mg | INTRAMUSCULAR | Status: DC | PRN

## 2020-12-26 MED ORDER — ONDANSETRON HCL 4 MG/2ML IJ SOLN
INTRAMUSCULAR | Status: AC
  Filled 2020-12-26: qty 2

## 2020-12-26 MED ORDER — PROPOFOL 10 MG/ML IV BOLUS
INTRAVENOUS | Status: DC | PRN
  Administered 2020-12-26: 150 mg via INTRAVENOUS

## 2020-12-26 MED ORDER — DOCUSATE SODIUM 100 MG PO CAPS
100.0000 mg | ORAL_CAPSULE | Freq: Two times a day (BID) | ORAL | Status: DC
  Administered 2020-12-27: 100 mg via ORAL
  Filled 2020-12-26 (×3): qty 1

## 2020-12-26 MED ORDER — CELECOXIB 200 MG PO CAPS
ORAL_CAPSULE | ORAL | Status: AC
  Administered 2020-12-26: 200 mg via ORAL
  Filled 2020-12-26: qty 1

## 2020-12-26 MED ORDER — ACETAMINOPHEN 500 MG PO TABS: 1000.0000 mg | ORAL_TABLET | Freq: Four times a day (QID) | ORAL | Status: AC

## 2020-12-26 MED ORDER — SODIUM CHLORIDE 0.9% FLUSH
3.0000 mL | Freq: Two times a day (BID) | INTRAVENOUS | Status: DC
  Administered 2020-12-27: 3 mL via INTRAVENOUS

## 2020-12-26 MED ORDER — PROPOFOL 10 MG/ML IV BOLUS
INTRAVENOUS | Status: DC | PRN
  Administered 2020-12-26: 150 ug/kg/min via INTRAVENOUS

## 2020-12-26 MED ORDER — PHENYLEPHRINE HCL-NACL 20-0.9 MG/250ML-% IV SOLN
INTRAVENOUS | Status: DC | PRN
  Administered 2020-12-26: 20 ug/min via INTRAVENOUS

## 2020-12-26 MED ORDER — BISACODYL 10 MG RE SUPP
10.0000 mg | Freq: Every day | RECTAL | Status: DC | PRN
  Filled 2020-12-26 (×2): qty 1

## 2020-12-26 SURGICAL SUPPLY — 75 items
ADH SKN CLS APL DERMABOND .7 (GAUZE/BANDAGES/DRESSINGS) ×1
AGENT HMST KT MTR STRL THRMB (HEMOSTASIS) ×1
APL PRP STRL LF DISP 70% ISPRP (MISCELLANEOUS) ×2
BIT DRILL 13 (BIT) ×1 IMPLANT
BLADE BOVIE TIP EXT 4 (BLADE) ×2 IMPLANT
BUR NEURO DRILL SOFT 3.0X3.8M (BURR) ×2 IMPLANT
BUR SABER DIAMOND 3.0 (BURR) IMPLANT
CHLORAPREP W/TINT 26 (MISCELLANEOUS) ×4 IMPLANT
COUNTER NEEDLE 20/40 LG (NEEDLE) ×2 IMPLANT
COVER LIGHT HANDLE STERIS (MISCELLANEOUS) ×4 IMPLANT
CUP MEDICINE 2OZ PLAST GRAD ST (MISCELLANEOUS) ×4 IMPLANT
DERMABOND ADVANCED (GAUZE/BANDAGES/DRESSINGS) ×1
DERMABOND ADVANCED .7 DNX12 (GAUZE/BANDAGES/DRESSINGS) ×1 IMPLANT
DRAPE C-ARM 42X72 X-RAY (DRAPES) ×4 IMPLANT
DRAPE INCISE IOBAN 66X45 STRL (DRAPES) ×2 IMPLANT
DRAPE MICROSCOPE SPINE 48X150 (DRAPES) ×2 IMPLANT
DRAPE THYROID T SHEET (DRAPES) ×2 IMPLANT
DRSG TEGADERM 4X4.75 (GAUZE/BANDAGES/DRESSINGS) ×1 IMPLANT
ELECT CAUTERY BLADE TIP 2.5 (TIP) ×2
ELECT EZSTD 165MM 6.5IN (MISCELLANEOUS) ×2
ELECT REM PT RETURN 9FT ADLT (ELECTROSURGICAL) ×2
ELECTRODE CAUTERY BLDE TIP 2.5 (TIP) ×1 IMPLANT
ELECTRODE EZSTD 165MM 6.5IN (MISCELLANEOUS) ×1 IMPLANT
ELECTRODE REM PT RTRN 9FT ADLT (ELECTROSURGICAL) ×1 IMPLANT
FEE INTRAOP CADWELL SUPPLY NCS (MISCELLANEOUS) ×1 IMPLANT
FEE INTRAOP MONITOR IMPULS NCS (MISCELLANEOUS) IMPLANT
GAUZE 4X4 16PLY ~~LOC~~+RFID DBL (SPONGE) ×2 IMPLANT
GAUZE SPONGE 4X4 12PLY STRL (GAUZE/BANDAGES/DRESSINGS) ×2 IMPLANT
GLOVE SRG 8 PF TXTR STRL LF DI (GLOVE) ×2 IMPLANT
GLOVE SURG SYN 6.5 ES PF (GLOVE) ×4 IMPLANT
GLOVE SURG SYN 6.5 PF PI (GLOVE) ×2 IMPLANT
GLOVE SURG SYN 8.0 (GLOVE) ×4 IMPLANT
GLOVE SURG SYN 8.0 PF PI (GLOVE) ×2 IMPLANT
GLOVE SURG UNDER POLY LF SZ6.5 (GLOVE) ×4 IMPLANT
GLOVE SURG UNDER POLY LF SZ8 (GLOVE) ×4
GOWN SRG LRG LVL 4 IMPRV REINF (GOWNS) ×2 IMPLANT
GOWN STRL REIN LRG LVL4 (GOWNS) ×4
GOWN STRL REUS W/ TWL XL LVL3 (GOWN DISPOSABLE) ×2 IMPLANT
GOWN STRL REUS W/TWL XL LVL3 (GOWN DISPOSABLE) ×4
GRADUATE 1200CC STRL 31836 (MISCELLANEOUS) ×2 IMPLANT
HOLDER FOLEY CATH W/STRAP (MISCELLANEOUS) ×1 IMPLANT
INTRAOP CADWELL SUPPLY FEE NCS (MISCELLANEOUS) ×1
INTRAOP DISP SUPPLY FEE NCS (MISCELLANEOUS) ×2
INTRAOP MONITOR FEE IMPULS NCS (MISCELLANEOUS) ×1
INTRAOP MONITOR FEE IMPULSE (MISCELLANEOUS) ×2
KIT TURNOVER KIT A (KITS) ×2 IMPLANT
MANIFOLD NEPTUNE II (INSTRUMENTS) ×2 IMPLANT
MARKER SKIN DUAL TIP RULER LAB (MISCELLANEOUS) ×4 IMPLANT
NDL SPNL 22GX3.5 QUINCKE BK (NEEDLE) ×1 IMPLANT
NEEDLE HYPO 22GX1.5 SAFETY (NEEDLE) ×2 IMPLANT
NEEDLE SPNL 22GX3.5 QUINCKE BK (NEEDLE) ×2 IMPLANT
NS IRRIG 1000ML POUR BTL (IV SOLUTION) ×2 IMPLANT
PACK LAMINECTOMY NEURO (CUSTOM PROCEDURE TRAY) ×2 IMPLANT
PAD ARMBOARD 7.5X6 YLW CONV (MISCELLANEOUS) ×4 IMPLANT
PENCIL ELECTRO HAND CTR (MISCELLANEOUS) ×1 IMPLANT
PIN CASPAR 14 (PIN) ×1 IMPLANT
PIN CASPAR 14MM (PIN) ×2
PLATE ZEVO 3LVL 53MM (Plate) ×1 IMPLANT
SCREW 3.5 SELFDRILL 15MM VARI (Screw) ×8 IMPLANT
SPACER BONE CORNERSTONE 6X14 (Orthopedic Implant) ×3 IMPLANT
SPONGE KITTNER 5P (MISCELLANEOUS) ×3 IMPLANT
SURGIFLO W/THROMBIN 8M KIT (HEMOSTASIS) ×2 IMPLANT
SUT MNCRL 4-0 (SUTURE)
SUT MNCRL 4-0 27XMFL (SUTURE)
SUT SILK 2 0 (SUTURE)
SUT SILK 2-0 18XBRD TIE 12 (SUTURE) IMPLANT
SUT VICRYL 2-0 SH 8X27 (SUTURE) ×2 IMPLANT
SUT VICRYL 3-0 CR8 SH (SUTURE) ×2 IMPLANT
SUTURE MNCRL 4-0 27XMF (SUTURE) ×1 IMPLANT
SYR 30ML LL (SYRINGE) ×2 IMPLANT
TAPE CLOTH 3X10 WHT NS LF (GAUZE/BANDAGES/DRESSINGS) ×4 IMPLANT
TOWEL OR 17X26 4PK STRL BLUE (TOWEL DISPOSABLE) ×4 IMPLANT
TRAY FOLEY MTR SLVR 16FR STAT (SET/KITS/TRAYS/PACK) ×1 IMPLANT
TUBING CONNECTING 10 (TUBING) ×2 IMPLANT
WATER STERILE IRR 500ML POUR (IV SOLUTION) ×1 IMPLANT

## 2020-12-26 NOTE — Transfer of Care (Signed)
Immediate Anesthesia Transfer of Care Note  Patient: Caleb Wall  Procedure(s) Performed: C3-6 ANTERIOR CERVICAL DECOMPRESSION/DISCECTOMY FUSION 3 LEVELS (Back)  Patient Location: PACU  Anesthesia Type:General  Level of Consciousness: awake, alert  and oriented  Airway & Oxygen Therapy: Patient Spontanous Breathing and Patient connected to face mask oxygen  Post-op Assessment: Report given to RN and Post -op Vital signs reviewed and stable  Post vital signs: Reviewed and stable  Last Vitals:  Vitals Value Taken Time  BP 143/98 12/26/20 1815  Temp 36.1 C 12/26/20 1807  Pulse 77 12/26/20 1820  Resp 21 12/26/20 1820  SpO2 100 % 12/26/20 1820  Vitals shown include unvalidated device data.  Last Pain:  Vitals:   12/26/20 1807  TempSrc:   PainSc: Asleep         Complications: No notable events documented.

## 2020-12-26 NOTE — Anesthesia Procedure Notes (Addendum)
Procedure Name: Intubation Date/Time: 12/26/2020 2:00 PM Performed by: Chanetta Marshall, CRNA Pre-anesthesia Checklist: Patient identified, Emergency Drugs available, Suction available and Patient being monitored Patient Re-evaluated:Patient Re-evaluated prior to induction Oxygen Delivery Method: Circle system utilized Preoxygenation: Pre-oxygenation with 100% oxygen Induction Type: IV induction Ventilation: Mask ventilation without difficulty Laryngoscope Size: McGraph and 4 Grade View: Grade I Tube type: Oral Tube size: 7.0 mm Number of attempts: 1 Airway Equipment and Method: Stylet, Oral airway and Video-laryngoscopy Placement Confirmation: ETT inserted through vocal cords under direct vision, positive ETCO2, breath sounds checked- equal and bilateral and CO2 detector Secured at: 23 cm Tube secured with: Tape Dental Injury: Teeth and Oropharynx as per pre-operative assessment

## 2020-12-26 NOTE — H&P (Signed)
Caleb Wall is an 65 y.o. male.   Chief Complaint: Right side numbness HPI: [name] is here for evaluation with ongoing symptoms of right-sided numbness, balance difficulty, and some weakness. He states he does not member any significant falls but he has had multiple. He does feel like the numbness is somewhat newer and he does endorse a recent burn on his leg where he could not feel that he was being burned. Unfortunately, he is also currently being treated for sepsis that was diagnosed a few weeks ago due to an infected kidney stone. He completed antibiotics for this  He does feel like the right arm and leg is numb throughout almost all dermatomes. He is not endorse any significant numbness on the left side He does have some fine motor weakness with his hands. He is right-handed. He does not endorse any radicular type pain. He does endorse some posterior neck pain. He has had an MRI and CT of the cervical spine showing multilevel stenosis, worse at C3/4 and decompression and fusion was recommended for C3-C7  Past Medical History:  Diagnosis Date   Allergy    Seasonal   Hepatitis C    Hypertension    Prediabetes     Past Surgical History:  Procedure Laterality Date   JOINT REPLACEMENT Left 02/2020   KNEE SURGERY Left 2002   Meniscus tear   LITHOTRIPSY  12/13/2020    Family History  Problem Relation Age of Onset   Heart disease Mother    Hypertension Mother    Diabetes Maternal Aunt    Cancer Maternal Uncle        Prostate   Social History:  reports that he quit smoking about 17 months ago. His smoking use included cigarettes. He has never used smokeless tobacco. He reports that he does not currently use drugs after having used the following drugs: Marijuana. He reports that he does not drink alcohol.  Allergies: No Known Allergies  Medications Prior to Admission  Medication Sig Dispense Refill   brimonidine (ALPHAGAN) 0.2 % ophthalmic solution Place 1 drop into both eyes 2  (two) times daily.     Docusate Calcium (STOOL SOFTENER PO) Take 1 tablet by mouth 2 (two) times daily.     dorzolamide-timolol (COSOPT) 22.3-6.8 MG/ML ophthalmic solution Place 1 drop into both eyes 2 (two) times daily.     finasteride (PROSCAR) 5 MG tablet Take 5 mg by mouth daily.     latanoprost (XALATAN) 0.005 % ophthalmic solution Place 1 drop into both eyes at bedtime.     lisinopril (ZESTRIL) 40 MG tablet Take 40 mg by mouth daily.     oxybutynin (DITROPAN) 5 MG tablet Take 5 mg by mouth 3 (three) times daily.     prednisoLONE acetate (PRED FORTE) 1 % ophthalmic suspension Place 1 drop into the left eye 2 (two) times daily.     tadalafil (CIALIS) 5 MG tablet Take 5 mg by mouth daily.     traMADol (ULTRAM) 50 MG tablet Take 50 mg by mouth every 6 (six) hours as needed for pain.      No results found for this or any previous visit (from the past 48 hour(s)). No results found.  Review of Systems General ROS: Negative Psychological ROS: Negative Ophthalmic ROS: Negative ENT ROS: Negative Hematological and Lymphatic ROS: Negative  Endocrine ROS: Negative Respiratory ROS: Negative Cardiovascular ROS: Negative Gastrointestinal ROS: Negative Genito-Urinary ROS: Negative Musculoskeletal ROS: Positive for neck pain Neurological ROS: Positive for weakness, numbness  Dermatological ROS: Negative Blood pressure (!) 133/93, pulse 71, temperature (!) 97.1 F (36.2 C), temperature source Temporal, resp. rate 16, SpO2 99 %. Physical Exam  General appearance: Alert, cooperative, in no acute distress Head: Normocephalic, atraumatic Eyes: Normal, EOM intact Oropharynx: Wearing facemask Neck: Supple, range of motion appears full Ext: Healing burn noted on the right anterior thigh  Neurologic exam:  Mental status: alertness: alert, affect: normal Speech: fluent and clear Motor:strength symmetric 5/5 in bilateral deltoid, bicep, tricep. He is 4 out of 5 in interossei, 4+ out of 5 in  grip He is 5-5 in bilateral lower extremities Sensory: Decreased to light touch throughout the right arm and right leg, intact on the left side Reflexes: 3+ at bilateral biceps, 2+ at bilateral patella, positive Hoffmann's on the right Gait: normal  Assessment/Plan Proceed with C3-7 ACDF  Deetta Perla, MD 12/26/2020, 1:17 PM

## 2020-12-26 NOTE — Op Note (Signed)
Operative Note   SURGERY DATE:  12/26/2020   PRE-OP DIAGNOSIS: Cervical spinal stenosis with myelopathy   POST-OP DIAGNOSIS: Post-Op Diagnosis Codes: Cervical spinal stenosis with myelopathy   Procedure(s) with comments: C3-4, C4-5, C5-6 anterior cervical discectomy and fusion   SURGEON:     * Malen Gauze, MD       Cooper Render, PA Assistant   ANESTHESIA: General    OPERATIVE FINDINGS: Stenosis at C3-4, C4-5, C5-6   Procedure Indications Mr. Hamman presented to our clinic on 10/18 with weakness after a fall. 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 Given this, we recommended an anterior cervical decompression and fusion to relieve the pressure on the spinal cord. The risks of hematoma, infection, poor bone healing and failure of fusion, cord injury, weakness, numbness, neck pain, stroke, and death were discussed in detail. All questions were answered and the patient elected to proceed with the surgery.     Procedure After obtaining informed consent, the patient was taken to the Operating Room where general anesthesia was induced and the patient intubated. Vascular access was obtained. Decadron was administered. The head was slightly extended and imaging used to identify a skin crease overlying the C5 vertebral body.  Neuro monitoring electrodes were placed for baseline MEP's and SSEPs which were seen to be weak and some of the motor areas.  A Foley catheter was placed.    The patient was prepped and draped in the usual sterile fashion and a timeout was performed per protocol. Local anesthesia was instilled with epinephrine along the planned incision site. A transverse cervical incision was performed on the left in a skin crease. The incision was carried to the level of the platysma and then cautery was used to incise the muscle. Blunt dissection was used to expand the plane and the dissection was carried deep medial to the SCM and carotid sheath  being careful to identify the trachea and esophagus medially. The prevertebral fascia was identified and this was bluntly dissected to expose the disc spaces. A needle was placed in the disc space and x-ray confirmed the C3/4 disc level.     Next, cautery was used to undermine the longus colli muscles bilaterally and identify the C3-4 disc space. Caspar pins were placed at C3 and C4 and a retractor system placed under the muscles to complete the exposure. Next, a combination of curettes and Kerrison rongeurs were used to remove the anterior osteophyte at C3-4 and then the disc material. A 70mm matchstick was used to shave the endplates of the adjacent bodies. The microscope was brought into the field for the remainder of the surgery. The drill was used to remove the osteophyte/disc complex deep to the level of the PLL at C3-4. There was significant disc protrusion at this level that was removed with rongeurs. The PLL was entered with a hook and then the PLL was removed along with remaining disc material to decompress centrally and then out into bilateral neuro foramen. A blunt probe was used to confirm no residual stenosis laterally and a curette used to ensure no posterior osteophyte remained. Floseal was used for hemostasis. Allograft was placed, 56mm in height and placed slightly recessed to the anterior edge of vertebral body   The caspar pin was moved from C3 to C5. The disc space was entered there sharply and the disc material removed in a similar fashion at the C4-5 level. The endplates were prepared using the hgh speed  drill and then the posterior disc/osteophyte complex also removed in similar fashion.  A trial spacer was used to size the graft. The PLL was entered with a hook and then the thickened PLL was removed along with remaining disc material to decompress centrally and then out into bilateral neuro foramen. A blunt probe was used to confirm no residual stenosis laterally and a curette used to  ensure no posterior osteophyte remained. Floseal was used for hemostasis.  Allograft was placed, 40mm in height and placed slightly recessed to the anterior edge of vertebral body   The caspar pin was moved from C4 to C6 and the disc space was entered there sharply and the disc material removed in a similar fashion at the C 5 6 level. The endplates were prepared using the hgh speed drill and then the posterior disc/osteophyte complex also removed in similar fashion.  The PLL was entered with a hook and then the thickened PLL was removed along with remaining disc material to decompress centrally and then out into bilateral neuro foramen. A blunt probe was used to confirm no residual stenosis laterally and a curette used to ensure no posterior osteophyte remained. Floseal was used for hemostasis.  Allograft was placed, 68mm in height and placed slightly recessed to the anterior edge of vertebral body.  X-ray confirmed good placement of grafts.  Monitoring remained stable throughout the case.  The caspar pins were removed and bone wax placed. The remainder of the osteophytes were drilled to allow for plating. Next, a 45mm plate was found to be the adequate size and was placed in the midline and secured with two 58mm screws at each bone level. Xray was obtained confirming good graft placement and adequate depth of screws. The retractors were removed. The wound was irrigated copiously and hemostasis obtained. The platysma was closed with 2-0 vicryl suture. The dermis was closed with 3-0 Vicryl and Dermabond was placed on the skin.    The patient had general anesthesia reversed and was extubated following the procedure. He awoke following commands with symmetric movement. He was taken to the PACU where he continued his recovery and then the ward.     ESTIMATED BLOOD LOSS:   50 cc   SPECIMENS None   IMPLANT CORNERSTONE [id number] LORDOTI - [id number]  Inventory Item: CORNERSTONE [id number] LORDOTI Serial  no.: [id number] Model/Cat no.: [id number]  Implant name: CORNERSTONE [id number] LORDOTI - [id number] Laterality: N/A Area: Spine Cervical  Manufacturer: [employer] Date of Manufacture:    Action: Implanted Number Used: 1   Device Identifier:  Device Identifier Type:     CORNERSTONE [id number] LORDOTI - [id number]  Inventory Item: CORNERSTONE [id number] LORDOTI Serial no.: [id number] Model/Cat no.: [id number]  Implant name: CORNERSTONE [id number] LORDOTI - [id number] Laterality: N/A Area: Spine Cervical  Manufacturer: [employer] Date of Manufacture:    Action: Implanted Number Used: 1   Device Identifier:  Device Identifier Type:     CORNERSTONE [id number] LORDOTI - [id number]  Inventory Item: CORNERSTONE [id number] LORDOTI Serial no.: [id number] Model/Cat no.: [id number]  Implant name: CORNERSTONE [id number] LORDOTI - [id number] Laterality: N/A Area: Spine Cervical  Manufacturer: [employer] Date of Manufacture:    Action: Implanted Number Used: 1   Device Identifier:  Device Identifier Type:     PLATE ZEVO 3LVL 17EY - [id number]  Inventory Item: PLATE ZEVO 3LVL 81KG Serial no.:  Model/Cat no.: [id number]  Implant name: Ellen Henri 3LVL 81EH - [id number] Laterality: N/A Area: Spine Cervical  Manufacturer: [place]  Date of Manufacture:    Action: Implanted Number Used: 1   Device Identifier:  Device Identifier Type:     SCREW 3.5 SELFDRILL 15MM VARI - [id number]  Inventory Item: SCREW 3.5 SELFDRILL 15MM VARI Serial no.:  Model/Cat no.: [id number]  Implant name: SCREW 3.5 SELFDRILL 15MM VARI - [id number] Laterality: N/A Area: Spine Cervical  Manufacturer: [employer] Date of Manufacture:    Action: Implanted Number Used: 8   Device Identifier:  Device Identifier Type:         I performed the case in its entirety with assistance of PA, Ernestene Kiel, [place]

## 2020-12-26 NOTE — Anesthesia Postprocedure Evaluation (Signed)
Anesthesia Post Note  Patient: Caleb Wall  Procedure(s) Performed: C3-6 ANTERIOR CERVICAL DECOMPRESSION/DISCECTOMY FUSION 3 LEVELS (Back)  Patient location during evaluation: PACU Anesthesia Type: General Level of consciousness: awake and alert Pain management: pain level controlled Vital Signs Assessment: post-procedure vital signs reviewed and stable Respiratory status: spontaneous breathing, nonlabored ventilation and respiratory function stable Cardiovascular status: blood pressure returned to baseline and stable Postop Assessment: no apparent nausea or vomiting Anesthetic complications: no Comments: Required antihypertensive medications   No notable events documented.   Last Vitals:  Vitals:   12/26/20 2030 12/26/20 2043  BP: 139/84 (!) 156/85  Pulse: 81 82  Resp: (!) 21 19  Temp:  (!) 36.1 C  SpO2: 100% 100%    Last Pain:  Vitals:   12/26/20 2043  TempSrc: Temporal  PainSc:                  [name]

## 2020-12-26 NOTE — Anesthesia Preprocedure Evaluation (Signed)
Anesthesia Evaluation  Patient identified by MRN, date of birth, ID band Patient awake    Reviewed: Allergy & Precautions, H&P , NPO status , Patient's Chart, lab work & pertinent test results, reviewed documented beta blocker date and time   History of Anesthesia Complications Negative for: history of anesthetic complications  Airway Mallampati: III  TM Distance: >3 FB Neck ROM: full    Dental  (+) Dental Advidsory Given, Teeth Intact, Missing   Pulmonary neg shortness of breath, asthma (as a kid) , neg sleep apnea, neg recent URI, former smoker,    Pulmonary exam normal breath sounds clear to auscultation       Cardiovascular Exercise Tolerance: Good hypertension, (-) angina(-) Past MI and (-) Cardiac Stents Normal cardiovascular exam(-) dysrhythmias (-) Valvular Problems/Murmurs Rhythm:regular Rate:Normal     Neuro/Psych negative neurological ROS  negative psych ROS   GI/Hepatic negative GI ROS, (+) Hepatitis -, C  Endo/Other  diabetes (pre-diabetes)  Renal/GU negative Renal ROS  negative genitourinary   Musculoskeletal   Abdominal   Peds  Hematology negative hematology ROS (+)   Anesthesia Other Findings Past Medical History: No date: Allergy     Comment:  Seasonal No date: Hepatitis C No date: Hypertension No date: Prediabetes   Reproductive/Obstetrics negative OB ROS                             Anesthesia Physical Anesthesia Plan  ASA: 2  Anesthesia Plan: General   Post-op Pain Management:    Induction: Intravenous  PONV Risk Score and Plan: 2 and Ondansetron, Dexamethasone, Midazolam and Treatment may vary due to age or medical condition  Airway Management Planned: Oral ETT  Additional Equipment:   Intra-op Plan:   Post-operative Plan: Extubation in OR  Informed Consent: I have reviewed the patients History and Physical, chart, labs and discussed the procedure  including the risks, benefits and alternatives for the proposed anesthesia with the patient or authorized representative who has indicated his/her understanding and acceptance.     Dental Advisory Given  Plan Discussed with: Anesthesiologist, CRNA and Surgeon  Anesthesia Plan Comments:         Anesthesia Quick Evaluation

## 2020-12-26 NOTE — Interval H&P Note (Signed)
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  The various methods of treatment have been discussed with the patient and family. After consideration of risks, benefits and other options for treatment, the patient has consented to  Procedure(s): C3-7 ANTERIOR CERVICAL DECOMPRESSION/DISCECTOMY FUSION 4 LEVELS (N/A) as a surgical intervention.  The patient's history has been reviewed, patient examined, no change in status, stable for surgery.  I have reviewed the patient's chart and labs.  Questions were answered to the patient's satisfaction.     Deetta Perla

## 2020-12-27 ENCOUNTER — Encounter: Payer: Self-pay | Admitting: Neurosurgery

## 2020-12-27 DIAGNOSIS — M4802 Spinal stenosis, cervical region: Secondary | ICD-10-CM | POA: Diagnosis not present

## 2020-12-27 MED ORDER — OXYBUTYNIN CHLORIDE 5 MG PO TABS
ORAL_TABLET | ORAL | Status: AC
  Filled 2020-12-27: qty 1

## 2020-12-27 MED ORDER — OXYCODONE-ACETAMINOPHEN 7.5-325 MG PO TABS: 1.0000 | ORAL_TABLET | ORAL | 0 refills | Status: AC | PRN

## 2020-12-27 MED ORDER — CELECOXIB 200 MG PO CAPS
ORAL_CAPSULE | ORAL | Status: AC
  Administered 2020-12-27: 200 mg via ORAL
  Filled 2020-12-27: qty 1

## 2020-12-27 MED ORDER — CELECOXIB 200 MG PO CAPS: 200.0000 mg | ORAL_CAPSULE | Freq: Two times a day (BID) | ORAL | 1 refills | Status: DC

## 2020-12-27 MED ORDER — ACETAMINOPHEN 500 MG PO TABS
ORAL_TABLET | ORAL | Status: AC
  Administered 2020-12-27: 1000 mg via ORAL
  Filled 2020-12-27: qty 2

## 2020-12-27 MED ORDER — MORPHINE SULFATE (PF) 2 MG/ML IV SOLN
INTRAVENOUS | Status: AC
  Administered 2020-12-27: 2 mg via INTRAVENOUS
  Filled 2020-12-27: qty 1

## 2020-12-27 MED ORDER — METHOCARBAMOL 500 MG PO TABS
1000.0000 mg | ORAL_TABLET | Freq: Four times a day (QID) | ORAL | 0 refills | Status: DC | PRN
Start: 2020-12-27 — End: 2021-05-05

## 2020-12-27 MED ORDER — OXYCODONE HCL 5 MG PO TABS
ORAL_TABLET | ORAL | Status: AC
  Filled 2020-12-27: qty 2

## 2020-12-27 MED ORDER — SENNOSIDES-DOCUSATE SODIUM 8.6-50 MG PO TABS
1.0000 | ORAL_TABLET | Freq: Every evening | ORAL | 0 refills | Status: DC | PRN
Start: 2020-12-27 — End: 2022-07-04

## 2020-12-27 MED ORDER — MORPHINE SULFATE (PF) 2 MG/ML IV SOLN
INTRAVENOUS | Status: AC
  Filled 2020-12-27: qty 1

## 2020-12-27 NOTE — Progress Notes (Signed)
    Attending Progress Note  History: Caleb Wall is status post 3-6 ACDF  POD1: No acute events overnight  Physical Exam: Vitals:   12/27/20 0400 12/27/20 0719  BP: (!) 158/96 (!) 168/98  Pulse: 92 90  Resp: 18 20  Temp: (!) 97.1 F (36.2 C) 97.8 F (36.6 C)  SpO2: 99% 99%    AA Ox3 CNI  Strength:5/5 throughout except 4 out of 5 bilateral hand intrinsics and 4+ out of 5 bilateral handgrip Incision is clean dry intact with Dermabond in place.  Data:  No results for input(s): NA, K, CL, CO2, BUN, CREATININE, LABGLOM, GLUCOSE, CALCIUM in the last 168 hours. No results for input(s): AST, ALT, ALKPHOS in the last 168 hours.  Invalid input(s): TBILI   No results for input(s): WBC, HGB, HCT, PLT in the last 168 hours. No results for input(s): APTT, INR in the last 168 hours.       Other tests/results: None   Assessment/Plan:  Caleb Wall is a 65 year old presenting with cervical stenosis and early signs of myelopathy status post C3-6 ACDF.  - mobilize - pain control - PTOT -Dispo planning  Cooper Render PA-C Department of Neurosurgery

## 2020-12-27 NOTE — Progress Notes (Signed)
Foley removed by Levada Dy RN

## 2020-12-27 NOTE — Progress Notes (Signed)
Messaged Dr Lacinda Axon regarding patient, he wants something to help him sleep. The pain is keeping him from sleeping. Dr Lacinda Axon states that patient is just a few hours out of surgery so he may not sleep much tonight, offered a heating pad and lidocaine patch. Patient opted for the lidocaine patch. Dr Lacinda Axon did place an order for this. Patient states his pain is at the surgery site and when he swallows.   Explained to patient that we will not be able to get him "pain free" but we want his pain to be tolerable and comfortable.

## 2020-12-27 NOTE — Evaluation (Signed)
Occupational Therapy Evaluation Patient Details Name: Caleb Wall MRN: [id number] DOB: 04/02/1955 Today's Date: 12/27/2020   History of Present Illness 65 year old presenting with cervical stenosis and myelopathy. Now s/p C3-6 ACDF   Clinical Impression   Pt seen for OT evaluation this date. Upon arrival to room, pt awake and seated upright in recliner with spouse present. Pt reporting 4/10 pain at rest and agreeable to OT eval. Prior to admission, pt was mod-independent with [facility] for functional mobility and ADLs, living in a 1-story home with spouse. Pt currently presents with decreased RUE/LE sensation (compared to LUE/LE), decreased balance, and decrease awareness of cervical precautions. Due to these current functional impairments, pt requires MIN A for seated LB dressing, MIN A for standing UB dressing, MIN GUARD for functional mobility of household distances (161ft) with RW, and SUPERVISION for standing grooming tasks. Pt educated in functional application of cervical precautions, log roll technique, AE/DME for bathing/dressing/toileting, and home/routines modifications. Pt and spouse verbalized understanding of all education/training provided. Handout provided to support recall and carry over of learned precautions/techniques. Pt would benefit from additional skilled OT services to maximize return to PLOF and minimize risk of future falls, injury, caregiver burden, and readmission. Upon discharge, following physician's recommendations for discharge plan and follow up therapies.      Recommendations for follow up therapy are one component of a multi-disciplinary discharge planning process, led by the attending physician.  Recommendations may be updated based on patient status, additional functional criteria and insurance authorization.   Follow Up Recommendations  Follow physician's recommendations for discharge plan and follow up therapies    Assistance Recommended at Discharge Intermittent  Supervision/Assistance  Functional Status Assessment  Patient has had a recent decline in their functional status and demonstrates the ability to make significant improvements in function in a reasonable and predictable amount of time.  Equipment Recommendations  None recommended by OT       Precautions / Restrictions Precautions Precautions: Fall Required Braces or Orthoses: Cervical Brace Cervical Brace: Hard collar;Other (comment) (May remove when in bed, may ambulate to bathroom without brace, and may remove brace to shower) Restrictions Weight Bearing Restrictions: No Other Position/Activity Restrictions: Cervical precautions      Mobility Bed Mobility               General bed mobility comments: not assessed, pt in recliner at beginning/end of session.    Transfers Overall transfer level: Needs assistance   Transfers: Sit to/from Stand Sit to Stand: Min guard           General transfer comment: Requires verbal cues for safe hand placement with RW use      Balance Overall balance assessment: Needs assistance Sitting-balance support: No upper extremity supported;Feet supported Sitting balance-Leahy Scale: Good Sitting balance - Comments: Good sitting balance reaching within BOS   Standing balance support: No upper extremity supported;During functional activity Standing balance-Leahy Scale: Fair Standing balance comment: SUPERVISION for standing hand hygiene                           ADL either performed or assessed with clinical judgement   ADL Overall ADL's : Needs assistance/impaired     Grooming: Wash/dry hands;Supervision/safety;Standing           Upper Body Dressing : Minimal assistance;Standing Upper Body Dressing Details (indicate cue type and reason): to don posterior hospital gown; MIN A for bringing around back Lower Body Dressing: Minimal assistance;Sitting/lateral leans Lower  Body Dressing Details (indicate cue type and  reason): to don/doff socks via figure-4 position. MIN A to adjust socks to ensure grip on bottom of foot             Functional mobility during ADLs: Min guard;Rolling walker (2 wheels) (to walk 136ft)        Pertinent Vitals/Pain Pain Assessment: 0-10 Pain Score: 4  Pain Location: neck Pain Descriptors / Indicators: Aching Pain Intervention(s): Limited activity within patient's tolerance;Monitored during session        Extremity/Trunk Assessment Upper Extremity Assessment Upper Extremity Assessment: RUE deficits/detail RUE Deficits / Details: Strength WFL. Pt endorses decreased light touch compared to left UE, however reports sensation has improved from baseline. Pt with difficulty performing digit oppostion to 5th digit d/t prior injury RUE Sensation: decreased light touch RUE Coordination: decreased fine motor   Lower Extremity Assessment Lower Extremity Assessment: Defer to PT evaluation;RLE deficits/detail RLE Sensation: decreased light touch   Cervical / Trunk Assessment Cervical / Trunk Assessment: Normal   Communication Communication Communication: No difficulties   Cognition Arousal/Alertness: Awake/alert Behavior During Therapy: WFL for tasks assessed/performed Overall Cognitive Status: Within Functional Limits for tasks assessed                                 General Comments: Pt able to recall pre-op education provided. Required cues for safety awareness and for adherance to cervical precautions during functional tasks        Exercises Other Exercises Other Exercises: Pt educated in functional application of cervical precautions, log roll technique, AE/DME for bathing/dressing/ toileting, and home/routines modifications. Pt and spouse verbalized understanding of all education/training provided. Handout provided to support recall and carry over of learned precautions/techniques.        Home Living Family/patient expects to be discharged  to:: Private residence Living Arrangements: Spouse/significant other Available Help at Discharge: Family;Available 24 hours/day Type of Home: House Home Access: Stairs to enter [employer] of Steps: 6 Entrance Stairs-Rails: Can reach both Home Layout: One level;Other (Comment) (1 step to enter bathroom)     Bathroom Shower/Tub: Tub/shower unit   Bathroom Toilet: Handicapped height     Home Equipment: [place] - single point;Grab bars - tub/shower;Hand held shower head          Prior Functioning/Environment Prior Level of Function : Independent/Modified Independent             Mobility Comments: MOD-I with [facility] for functional mobility. Pt endorses 20+ falls within past year (pt attributes to decreased RLE sensation) ADLs Comments: Independent with ADLs        OT Problem List: Impaired balance (sitting and/or standing);Decreased knowledge of precautions;Impaired sensation;Pain      OT Treatment/Interventions: Self-care/ADL training;Therapeutic exercise;DME and/or AE instruction;Therapeutic activities;Patient/family education;Balance training    OT Goals(Current goals can be found in the care plan section) Acute Rehab OT Goals Patient Stated Goal: to shower while following precautions OT Goal Formulation: With patient/family Time For Goal Achievement: 01/10/21 Potential to Achieve Goals: Good  OT Frequency: Min 2X/week    AM-PAC OT "6 Clicks" Daily Activity     Outcome Measure Help from another person eating meals?: None Help from another person taking care of personal grooming?: A Little Help from another person toileting, which includes using toliet, bedpan, or urinal?: A Little Help from another person bathing (including washing, rinsing, drying)?: A Little Help from another person to put on and taking off  regular upper body clothing?: A Little Help from another person to put on and taking off regular lower body clothing?: A Little 6 Click Score: 19   End  of Session Equipment Utilized During Treatment: Rolling walker (2 wheels) Nurse Communication: Mobility status  Activity Tolerance: Patient tolerated treatment well Patient left: in chair;with call bell/phone within reach;with family/visitor present  OT Visit Diagnosis: Unsteadiness on feet (R26.81);History of falling (Z91.81);Pain Pain - part of body:  (neck)                Time: 8115-7262 OT Time Calculation (min): 32 min Charges:  OT General Charges $OT Visit: 1 Visit OT Evaluation $OT Eval Moderate Complexity: 1 Mod OT Treatments $Self Care/Home Management : 23-37 mins  Fredirick Maudlin, OTR/L [place]

## 2020-12-27 NOTE — Discharge Instructions (Signed)
Your surgeon has performed an operation on your cervical spine (neck) to relieve pressure on the spinal cord and/or nerves. This involved making an incision in the front of your neck and removing one or more of the discs that support your spine. Next, a small piece of bone, a titanium plate, and screws were used to fuse two or more of the vertebrae (bones) together.  The following are instructions to help in your recovery once you have been discharged from the hospital. Even if you feel well, it is important that you follow these activity guidelines. If you do not let your neck heal properly from the surgery, you can increase the chance of return of your symptoms and other complications.  * Do not take anti-inflammatory medications for 3 months after surgery (naproxen [Aleve], ibuprofen [Advil, Motrin], \ etc.). These medications can prevent your bones from healing properly.  Activity    No bending, lifting, or twisting ("BLT"). Avoid lifting objects heavier than 10 pounds (gallon milk jug).  Where possible, avoid household activities that involve lifting, bending, reaching, pushing, or pulling such as laundry, vacuuming, grocery shopping, and childcare. Try to arrange for help from friends and family for these activities while your back heals.  Increase physical activity slowly as tolerated.  Taking short walks is encouraged, but avoid strenuous exercise. Do not jog, run, bicycle, lift weights, or participate in any other exercises unless specifically allowed by your doctor.  Talk to your doctor before resuming sexual activity.  You should not drive until cleared by your doctor.  Until released by your doctor, you should not return to work or school.  You should rest at home and let your body heal.   You may shower three days after your surgery.  After showering, lightly dab your incision dry. Do not take a tub bath or go swimming until approved by your doctor at your follow-up  appointment.  If your doctor ordered a cervical collar (neck brace) for you, you should wear it whenever you are out of bed. You may remove it when lying down or sleeping, but you should wear it at all other times. Not all neck surgeries require a cervical collar.  If you smoke, we strongly recommend that you quit.  Smoking has been proven to interfere with normal bone healing and will dramatically reduce the success rate of your surgery. Please contact [facility] (800-QUIT-NOW) and use the resources at www.[facility].com for assistance in stopping smoking.  Surgical Incision   If you have a dressing on your incision, you may remove it two days after your surgery. Keep your incision area clean and dry.  If you have staples or stitches on your incision, you should have a follow up scheduled for removal. If you do not have staples or stitches, you will have steri-strips (small pieces of surgical tape) or Dermabond glue. The steri-strips/glue should begin to peel away within about a week (it is fine if the steri-strips fall off before then). If the strips are still in place one week after your surgery, you may gently remove them.  Diet           You may return to your usual diet. However, you may experience discomfort when swallowing in the first month after your surgery. This is normal. You may find that softer foods are more comfortable for you to swallow. Be sure to stay hydrated.  When to Contact us  You may experience pain in your neck and/or pain between your shoulder blades.  This is normal and should improve in the next few weeks with the help of pain medication, muscle relaxers, and rest. Some patients report that a warm compress on the back of the neck or between the shoulder blades helps.  However, should you experience any of the following, contact us immediately: New numbness or weakness Pain that is progressively getting worse, and is not relieved by your pain medication, muscle  relaxers, rest, and warm compresses Bleeding, redness, swelling, pain, or drainage from surgical incision Chills or flu-like symptoms Fever greater than 101.0 F (38.3 C) Inability to eat, drink fluids, or take medications Problems with bowel or bladder functions Difficulty breathing or shortness of breath Warmth, tenderness, or swelling in your calf Contact Information During office hours (Monday-Friday 9 am to 5 pm), please call your physician at [phone] and ask for Berdine Addison After hours and weekends, please call [phone] and speak with the answering service, who will contact the doctor on call.  If that fails, call the [facility] Operator at [phone] and ask for the Neurosurgery Resident On Call  For a life-threatening emergency, call 911

## 2020-12-27 NOTE — Evaluation (Signed)
Physical Therapy Evaluation Patient Details Name: Caleb Wall MRN: [id number] DOB: Sep 09, 1955 Today's Date: 12/27/2020  History of Present Illness  65 year old male presenting with cervical stenosis and myelopathy. Now s/p C3-6 ACDF  Clinical Impression  Pt showed good overall effort and consistently speaks of how he is feeling so much better, especially with the improved sensation in R LE.  He reports he has had consistent falls with R knee buckling in the prior 6 months but felt much more confident with it today and though he had rare catching there was no buckling or excessive need of AD use.  He was able to ascend/descend steps with either leg leading and did very well with this.  Good overall POD1 effort and mobility.   Recommendations for follow up therapy are one component of a multi-disciplinary discharge planning process, led by the attending physician.  Recommendations may be updated based on patient status, additional functional criteria and insurance authorization.  Follow Up Recommendations Follow physician's recommendations for discharge plan and follow up therapies    Assistance Recommended at Discharge Set up Supervision/Assistance  Functional Status Assessment Patient has had a recent decline in their functional status and demonstrates the ability to make significant improvements in function in a reasonable and predictable amount of time.  Equipment Recommendations  None recommended by PT    Recommendations for Other Services       Precautions / Restrictions Precautions Precautions: Fall Required Braces or Orthoses: Cervical Brace Cervical Brace: Hard collar;Other (comment) (May remove when in bed, may ambulate to bathroom without brace, and may remove brace to shower) Restrictions Weight Bearing Restrictions: No Other Position/Activity Restrictions: Cervical precautions      Mobility  Bed Mobility Overal bed mobility: Modified Independent              General bed mobility comments: not assessed, pt in recliner at beginning/end of session.    Transfers Overall transfer level: Modified independent Equipment used: Straight cane Transfers: Sit to/from Stand Sit to Stand: Supervision           General transfer comment: Pt was able to rise w/o direct assist multiple times t/o session, no LOBs or hesitation.  some extra explanation for appropriate AD management during transitions    Ambulation/Gait Ambulation/Gait assistance: Min guard Gait Distance (Feet): 250 Feet Assistive device: Straight cane         General Gait Details: Pt was able to ambulate with confident cadence and did not have excessive need to rely on AD.  He had no LOBs but did show a few small scale, easily self arrested R knee "buckling" that he reports is already hugely improved as compared to pre-surgery.  Stairs Stairs: Yes Stairs assistance: Min guard Stair Management: One rail Right;With cane Number of Stairs: 8 General stair comments: up/down 4 steps X 2 with good safety and confidence.  He was able to do both reciprocal and step-to strategies w/o assist and needing only light cuing/guidance.  Wheelchair Mobility    Modified Rankin (Stroke Patients Only)       Balance Overall balance assessment: Needs assistance Sitting-balance support: No upper extremity supported;Feet supported Sitting balance-Leahy Scale: Good Sitting balance - Comments: Good sitting balance reaching within BOS   Standing balance support: Single extremity supported Standing balance-Leahy Scale: Fair Standing balance comment: SUPERVISION for general safety but pt is able to self arrest on the few rare occasions he had a small buckle  Pertinent Vitals/Pain Pain Assessment: 0-10 Pain Score: 4  Pain Location: anterior neck Pain Descriptors / Indicators: Aching Pain Intervention(s): Limited activity within patient's tolerance;Monitored  during session    [place] expects to be discharged to:: Private residence Living Arrangements: Spouse/significant other Available Help at Discharge: Family;Available 24 hours/day Type of Home: House Home Access: Stairs to enter Entrance Stairs-Rails: Can reach both Entrance Stairs-Number of Steps: 6   Home Layout: One level;Other (Comment) Home Equipment: Cane - single point;Grab bars - tub/shower;Hand held shower head;Rolling Walker (2 wheels)      Prior Function Prior Level of Function : Independent/Modified Independent             Mobility Comments: MOD-I with SPC for functional mobility. Pt endorses 20+ falls within past year (due to decreased RLE sensation) ADLs Comments: Independent with ADLs     Hand Dominance        Extremity/Trunk Assessment   Upper Extremity Assessment Upper Extremity Assessment: Defer to OT evaluation RUE Deficits / Details: Strength WFL. Pt endorses decreased light touch compared to left UE, however reports sensation has improved from baseline. Pt with difficulty performing digit oppostion to 5th digit d/t prior injury RUE Sensation: decreased light touch RUE Coordination: decreased fine motor    Lower Extremity Assessment Lower Extremity Assessment: Overall WFL for tasks assessed;Generalized weakness RLE Sensation: decreased light touch (R quad and hip flexion weaker than L but still WFL)    Cervical / Trunk Assessment Cervical / Trunk Assessment: Normal  Communication   Communication: No difficulties  Cognition Arousal/Alertness: Awake/alert Behavior During Therapy: WFL for tasks assessed/performed Overall Cognitive Status: Within Functional Limits for tasks assessed                                 General Comments: Pt able to recall pre-op education provided. Required cues for safety awareness and for adherance to cervical precautions during functional tasks        General Comments General  comments (skin integrity, edema, etc.): Pt moved safely and with confidence, he repeatedly talked about how much better (especially R LE strength/sensation) he is now vs pre surgery    Exercises Other Exercises Other Exercises: Pt educated in functional application of cervical precautions, log roll technique, AE/DME for bathing/dressing/ toileting, and home/routines modifications. Pt and spouse verbalized understanding of all education/training provided. Handout provided to support recall and carry over of learned precautions/techniques.   Assessment/Plan    PT Assessment Patient needs continued PT services  PT Problem List Decreased strength;Decreased range of motion;Decreased activity tolerance;Decreased balance;Decreased mobility;Decreased coordination;Decreased cognition;Decreased knowledge of use of DME;Decreased safety awareness;Pain       PT Treatment Interventions Gait training;DME instruction;Stair training;Therapeutic activities;Functional mobility training;Therapeutic exercise;Neuromuscular re-education;Patient/family education;Balance training    PT Goals (Current goals can be found in the Care Plan section)  Acute Rehab PT Goals Patient Stated Goal: go home PT Goal Formulation: With patient Time For Goal Achievement: 01/10/21 Potential to Achieve Goals: Fair    Frequency Min 2X/week   Barriers to discharge        Co-evaluation               AM-PAC PT "6 Clicks" Mobility  Outcome Measure Help needed turning from your back to your side while in a flat bed without using bedrails?: None Help needed moving from lying on your back to sitting on the side of a flat bed without using bedrails?: None  Help needed moving to and from a bed to a chair (including a wheelchair)?: None Help needed standing up from a chair using your arms (e.g., wheelchair or bedside chair)?: None Help needed to walk in hospital room?: None Help needed climbing 3-5 steps with a railing? :  None 6 Click Score: 24    End of Session Equipment Utilized During Treatment: Gait belt Activity Tolerance: Patient tolerated treatment well Patient left: in chair;with call bell/phone within reach Nurse Communication: Mobility status PT Visit Diagnosis: Muscle weakness (generalized) (M62.81);Difficulty in walking, not elsewhere classified (R26.2)    Time: 1020-1045 PT Time Calculation (min) (ACUTE ONLY): 25 min   Charges:   PT Evaluation $PT Eval Low Complexity: 1 Low PT Treatments $Gait Training: 8-22 mins        Kreg Shropshire, DPT 12/27/2020, 1:09 PM

## 2020-12-27 NOTE — Discharge Summary (Signed)
Physician Discharge Summary  Patient ID: Caleb Wall MRN: [id number] DOB/AGE: [birth year] 65 y.o.  Admit date: 12/26/2020 Discharge date: 12/27/2020  Admission Diagnoses: Cervical stenosis and myelopathy  Discharge Diagnoses:  Active Problems:   Cervical myelopathy [facility])   Discharged Condition: good  Hospital Course: Caleb Wall is a 65 year old presenting with cervical stenosis and myelopathy status post C3-6 DF.  His surgery was uncomplicated and he was monitored overnight without complication.  He was discharged home after ambulating, urinating, and tolerating p.o. intake.  He was given prescriptions for pain at discharge.  Consults: None  Significant Diagnostic Studies: none  Treatments: surgery: As above.  Please see separately dictated operative report for further details  Discharge Exam: Blood pressure (!) 168/98, pulse 90, temperature 97.8 F (36.6 C), resp. rate 20, SpO2 99 %. AA Ox3 CNI Strength:5/5 throughout except 4 out of 5 bilateral hand intrinsics and 4+ out of 5 bilateral handgrip Incision is clean dry intact with Dermabond in place.    Disposition: Discharge disposition: 01-Home or Self Care      Discharge Instructions     Diet - low sodium heart healthy   Complete by: As directed    Incentive spirometry RT   Complete by: As directed    No wound care   Complete by: As directed       Allergies as of 12/27/2020   No Known Allergies      Medication List     STOP taking these medications    traMADol 50 MG tablet Commonly known as: ULTRAM       TAKE these medications    brimonidine 0.2 % ophthalmic solution Commonly known as: ALPHAGAN Place 1 drop into both eyes 2 (two) times daily.   celecoxib 200 MG capsule Commonly known as: CELEBREX Take 1 capsule (200 mg total) by mouth every 12 (twelve) hours.   dorzolamide-timolol 22.3-6.8 MG/ML ophthalmic solution Commonly known as: COSOPT Place 1 drop into both eyes 2 (two) times  daily.   finasteride 5 MG tablet Commonly known as: PROSCAR Take 5 mg by mouth daily.   latanoprost 0.005 % ophthalmic solution Commonly known as: XALATAN Place 1 drop into both eyes at bedtime.   lisinopril 40 MG tablet Commonly known as: ZESTRIL Take 40 mg by mouth daily.   methocarbamol 500 MG tablet Commonly known as: ROBAXIN Take 2 tablets (1,000 mg total) by mouth every 6 (six) hours as needed for muscle spasms.   oxybutynin 5 MG tablet Commonly known as: DITROPAN Take 5 mg by mouth 3 (three) times daily.   oxyCODONE-acetaminophen 7.5-325 MG tablet Commonly known as: Percocet Take 1 tablet by mouth every 4 (four) hours as needed for up to 5 days for severe pain.   prednisoLONE acetate 1 % ophthalmic suspension Commonly known as: PRED FORTE Place 1 drop into the left eye 2 (two) times daily.   senna-docusate 8.6-50 MG tablet Commonly known as: Senokot-S Take 1 tablet by mouth at bedtime as needed for mild constipation.   STOOL SOFTENER PO Take 1 tablet by mouth 2 (two) times daily.   tadalafil 5 MG tablet Commonly known as: CIALIS Take 5 mg by mouth daily.         Signed: Loleta Dicker 12/27/2020, 8:35 AM

## 2021-02-21 DIAGNOSIS — Z20822 Contact with and (suspected) exposure to covid-19: Secondary | ICD-10-CM | POA: Diagnosis not present

## 2021-03-08 DIAGNOSIS — N202 Calculus of kidney with calculus of ureter: Secondary | ICD-10-CM | POA: Diagnosis not present

## 2021-03-08 DIAGNOSIS — N201 Calculus of ureter: Secondary | ICD-10-CM | POA: Diagnosis not present

## 2021-03-08 DIAGNOSIS — N200 Calculus of kidney: Secondary | ICD-10-CM | POA: Diagnosis not present

## 2021-03-10 DIAGNOSIS — Z981 Arthrodesis status: Secondary | ICD-10-CM | POA: Diagnosis not present

## 2021-03-17 DIAGNOSIS — G8929 Other chronic pain: Secondary | ICD-10-CM | POA: Diagnosis not present

## 2021-03-17 DIAGNOSIS — M461 Sacroiliitis, not elsewhere classified: Secondary | ICD-10-CM | POA: Diagnosis not present

## 2021-03-17 DIAGNOSIS — M5442 Lumbago with sciatica, left side: Secondary | ICD-10-CM | POA: Diagnosis not present

## 2021-03-20 DIAGNOSIS — M461 Sacroiliitis, not elsewhere classified: Secondary | ICD-10-CM | POA: Diagnosis not present

## 2021-03-24 DIAGNOSIS — N201 Calculus of ureter: Secondary | ICD-10-CM | POA: Diagnosis not present

## 2021-04-14 DIAGNOSIS — G8929 Other chronic pain: Secondary | ICD-10-CM | POA: Diagnosis not present

## 2021-04-14 DIAGNOSIS — M461 Sacroiliitis, not elsewhere classified: Secondary | ICD-10-CM | POA: Diagnosis not present

## 2021-04-14 DIAGNOSIS — M5442 Lumbago with sciatica, left side: Secondary | ICD-10-CM | POA: Diagnosis not present

## 2021-04-21 DIAGNOSIS — R7303 Prediabetes: Secondary | ICD-10-CM | POA: Diagnosis not present

## 2021-04-21 DIAGNOSIS — D171 Benign lipomatous neoplasm of skin and subcutaneous tissue of trunk: Secondary | ICD-10-CM | POA: Diagnosis not present

## 2021-04-21 DIAGNOSIS — R69 Illness, unspecified: Secondary | ICD-10-CM | POA: Diagnosis not present

## 2021-05-05 ENCOUNTER — Other Ambulatory Visit: Payer: Self-pay

## 2021-05-05 ENCOUNTER — Encounter: Payer: 59 | Attending: Infectious Diseases | Admitting: *Deleted

## 2021-05-05 ENCOUNTER — Encounter: Payer: Self-pay | Admitting: *Deleted

## 2021-05-05 VITALS — BP 148/92 | Ht 67.0 in | Wt 249.3 lb

## 2021-05-05 DIAGNOSIS — Z713 Dietary counseling and surveillance: Secondary | ICD-10-CM | POA: Diagnosis not present

## 2021-05-05 DIAGNOSIS — E119 Type 2 diabetes mellitus without complications: Secondary | ICD-10-CM | POA: Insufficient documentation

## 2021-05-05 NOTE — Progress Notes (Signed)
Diabetes Self-Management Education ? ?Visit Type: First/Initial ? ?Appt. Start Time: 0855 Appt. End Time: 1000 ? ?05/05/2021 ? ?[name], identified by name and date of birth, is a 66 y.o. male with a diagnosis of Diabetes: Type 2.  ? ?ASSESSMENT ? ?Blood pressure (!) 148/92, height 5' 7"  (1.702 m), weight 249 lb 4.8 oz (113.1 kg). ?Body mass index is 39.05 kg/m?. ? ? Diabetes Self-Management Education - 05/05/21 1018   ? ?  ? Visit Information  ? Visit Type First/Initial   ?  ? Initial Visit  ? Diabetes Type Type 2   ? Are you currently following a meal plan? No   ? Are you taking your medications as prescribed? Yes   ? Date Diagnosed March 2023 - also had A1C of 6.5% in 2021   ?  ? Health Coping  ? How would you rate your overall health? Good   ?  ? Psychosocial Assessment  ? Patient Belief/Attitude about Diabetes Other (comment)   "scary"  ? Self-care barriers None   ? Self-management support Doctor's office;Family   ? Patient Concerns Nutrition/Meal planning;Glycemic Control;Weight Control;Monitoring   ? Special Needs None   ? Preferred Learning Style Hands on   ? Learning Readiness Ready   ? How often do you need to have someone help you when you read instructions, pamphlets, or other written materials from your doctor or pharmacy? 1 - Never   ? What is the last grade level you completed in school? 12th   ?  ? Pre-Education Assessment  ? Patient understands the diabetes disease and treatment process. Needs Instruction   ? Patient understands incorporating nutritional management into lifestyle. Needs Instruction   ? Patient undertands incorporating physical activity into lifestyle. Needs Instruction   ? Patient understands using medications safely. Needs Instruction   ? Patient understands monitoring blood glucose, interpreting and using results Needs Instruction   ? Patient understands prevention, detection, and treatment of acute complications. Needs Instruction   ? Patient understands prevention,  detection, and treatment of chronic complications. Needs Instruction   ? Patient understands how to develop strategies to address psychosocial issues. Needs Instruction   ? Patient understands how to develop strategies to promote health/change behavior. Needs Instruction   ?  ? Complications  ? Last HgB A1C per patient/outside source 7.5 %   04/21/2021  ? How often do you check your blood sugar? Patient declines   BG in the office was 114 mg/dL at 9:55 am - 2 hrs after drinking coffee with sugar  ? Have you had a dilated eye exam in the past 12 months? Yes   ? Have you had a dental exam in the past 12 months? Yes   ? Are you checking your feet? Yes   ? How many days per week are you checking your feet? 7   ?  ? Dietary Intake  ? Breakfast bacon or [place] sausage, eggs and cheese sandwich, fruit (orange, apple, banana)   ? Lunch 6 inch sub from [employer] -wheat bread, mutliple meats, lettuce, tomato, mayo; 1/2 Cobb salad, 2 [employer] McDoubles   ? Snack (afternoon) 2 pieces of fruit   ? Dinner chicken, fish, potaotes, peas, beans, corn, green beans, rice spaghetti 2 x month, broccoli, greens, cabbage   ? Snack (evening) cookies   ? Beverage(s) water, fruit juice, sugar in coffee, soda 2 x week   ?  ? Exercise  ? Exercise Type ADL's   ?  ? Patient Education  ?  Previous Diabetes Education No   ? Disease state  Definition of diabetes, type 1 and 2, and the diagnosis of diabetes;Factors that contribute to the development of diabetes   ? Nutrition management  Role of diet in the treatment of diabetes and the relationship between the three main macronutrients and blood glucose level;Food label reading, portion sizes and measuring food.;Information on hints to eating out and maintain blood glucose control.   ? Physical activity and exercise  Role of exercise on diabetes management, blood pressure control and cardiac health.   ? Monitoring Identified appropriate SMBG and/or A1C goals.;Yearly dilated eye exam   ? Chronic  complications Relationship between chronic complications and blood glucose control;Retinopathy and reason for yearly dilated eye exams   Pt has glaucoma  ? Psychosocial adjustment Identified and addressed patients feelings and concerns about diabetes   ?  ? Individualized Goals (developed by patient)  ? Reducing Risk Other (comment)   improve blood sugars, prevent diabetes complications, lose weight  ?  ? Outcomes  ? Expected Outcomes Demonstrated interest in learning. Expect positive outcomes   ? Program Status Not Completed   ? ?  ?  ?Individualized Plan for Diabetes Self-Management Training:  ? ?Learning Objective:  Patient will have a greater understanding of diabetes self-management. ?Patient education plan is to attend individual and/or group sessions per assessed needs and concerns. ?  ?Plan:  ? ?Patient Instructions  ?Begin walking for    15  minutes   3  days a week and gradually increase to 30 minutes 5 x week ? ?Eat 3 meals day,   1-2  snacks a day ?Space meals 4-6 hours apart ?Limit desserts/sweets (2-3 x week) ?Avoid sugar sweetened drinks (soda, coffee, juices) ?Eat 1 serving of protein when eating fruit for a snack ? ?Call back if you want to attend classes or have an appointment with the nurse or dietitian ? ?Expected Outcomes:  Demonstrated interest in learning. Expect positive outcomes ? ?Education material provided:  ?Metallurgist Guidelines ?Simple Meal Plan ? ?If problems or questions, patient to contact team via:   ?Johny Drilling, RN, [place], [place] [phone] ? ?Future DSME appointment: ?The patient hasn't met his deductible and doesn't want to return for any further Diabetes education at this time.  ?

## 2021-05-05 NOTE — Patient Instructions (Signed)
Begin walking for    15  minutes   3  days a week and gradually increase to 30 minutes 5 x week ? ?Eat 3 meals day,   1-2  snacks a day ?Space meals 4-6 hours apart ?Limit desserts/sweets (2-3 x week) ?Avoid sugar sweetened drinks (soda, coffee, juices) ?Eat 1 serving of protein when eating fruit for a snack ? ?Call back if you want to attend classes or have an appointment with the nurse or dietitian ?

## 2021-05-12 ENCOUNTER — Ambulatory Visit: Payer: Self-pay | Admitting: *Deleted

## 2021-05-27 ENCOUNTER — Encounter (HOSPITAL_COMMUNITY): Payer: Self-pay | Admitting: *Deleted

## 2021-05-27 ENCOUNTER — Ambulatory Visit (HOSPITAL_COMMUNITY)
Admission: EM | Admit: 2021-05-27 | Discharge: 2021-05-27 | Disposition: A | Discharge status: Home / Self Care | Payer: 59 | Attending: Physician Assistant | Admitting: Physician Assistant

## 2021-05-27 ENCOUNTER — Other Ambulatory Visit: Payer: Self-pay

## 2021-05-27 DIAGNOSIS — J4521 Mild intermittent asthma with (acute) exacerbation: Secondary | ICD-10-CM | POA: Diagnosis not present

## 2021-05-27 DIAGNOSIS — R062 Wheezing: Secondary | ICD-10-CM

## 2021-05-27 LAB — CBG MONITORING, ED: Glucose-Capillary: 91 mg/dL (ref 70–99)

## 2021-05-27 MED ORDER — METHYLPREDNISOLONE SODIUM SUCC 125 MG IJ SOLR
80.0000 mg | Freq: Once | INTRAMUSCULAR | Status: AC
  Administered 2021-05-27: 80 mg via INTRAMUSCULAR

## 2021-05-27 MED ORDER — IPRATROPIUM-ALBUTEROL 0.5-2.5 (3) MG/3ML IN SOLN
3.0000 mL | Freq: Once | RESPIRATORY_TRACT | Status: AC
  Administered 2021-05-27: 3 mL via RESPIRATORY_TRACT

## 2021-05-27 MED ORDER — PREDNISONE 10 MG PO TABS: 20.0000 mg | ORAL_TABLET | Freq: Every day | ORAL | 0 refills | Status: DC

## 2021-05-27 MED ORDER — ALBUTEROL SULFATE HFA 108 (90 BASE) MCG/ACT IN AERS
2.0000 | INHALATION_SPRAY | Freq: Once | RESPIRATORY_TRACT | Status: AC
Start: 2021-05-27 — End: 2021-05-27
  Administered 2021-05-27: 2 via RESPIRATORY_TRACT

## 2021-05-27 MED ORDER — METHYLPREDNISOLONE SODIUM SUCC 125 MG IJ SOLR
INTRAMUSCULAR | Status: AC
  Filled 2021-05-27: qty 2

## 2021-05-27 MED ORDER — ALBUTEROL SULFATE HFA 108 (90 BASE) MCG/ACT IN AERS
INHALATION_SPRAY | RESPIRATORY_TRACT | Status: AC
  Filled 2021-05-27: qty 6.7

## 2021-05-27 MED ORDER — IPRATROPIUM-ALBUTEROL 0.5-2.5 (3) MG/3ML IN SOLN
RESPIRATORY_TRACT | Status: AC
  Filled 2021-05-27: qty 3

## 2021-05-27 MED ORDER — ALBUTEROL SULFATE HFA 108 (90 BASE) MCG/ACT IN AERS: 1.0000 | INHALATION_SPRAY | Freq: Four times a day (QID) | RESPIRATORY_TRACT | 1 refills | Status: DC | PRN

## 2021-05-27 NOTE — ED Provider Notes (Signed)
?[facility] ? ? ? ?CSN: [id number] ?Arrival date & time: 05/27/21  1648 ? ? ?  ? ?History   ?Chief Complaint ?Chief Complaint  ?Patient presents with  ? Shortness of Breath  ? Wheezing  ? ? ?HPI ?Caleb Wall is a 66 y.o. male.  ? ?Patient presents today with a 4 to 5-day history of shortness of breath and cough.  Reports symptoms began after he was cleaning with chemicals and used a leaf blower to blow off pollen from a stairwell.  He does have a history of seasonal allergies and takes Zyrtec daily.  He has been taking medication as prescribed.  He has also started taking DayQuil and NyQuil with minimal relief of symptoms.  He denies history of COPD but is a former smoker quit several years ago.  He reports having asthma when he was younger but has not required albuterol in adulthood.  Denies any recent antibiotic or steroid use.  He does have a history of diabetes with last A1c in March of 7.5%.  He is not currently on any medication as he has been using lifestyle changes to manage this condition. ? ? ?Past Medical History:  ?Diagnosis Date  ? Allergy   ? Seasonal  ? Diabetes mellitus without complication ([place])   ? Glaucoma   ? Hepatitis C   ? Hypertension   ? Prediabetes   ? ? ?Patient Active Problem List  ? Diagnosis Date Noted  ? Cervical myelopathy ([place]) 12/26/2020  ? Hypertension 02/23/2014  ? Plantar wart 02/23/2014  ? Benign prostatic hyperplasia 02/23/2014  ? Diabetes ([place]) 04/23/2013  ? ? ?Past Surgical History:  ?Procedure Laterality Date  ? ANTERIOR CERVICAL DECOMPRESSION/DISCECTOMY FUSION 4 LEVELS N/A 12/26/2020  ? Procedure: C3-6 ANTERIOR CERVICAL DECOMPRESSION/DISCECTOMY FUSION 3 LEVELS;  Surgeon: Deetta Perla, MD;  Location: [facility] ORS;  Service: Neurosurgery;  Laterality: N/A;  ? JOINT REPLACEMENT Left 02/2020  ? KNEE SURGERY Left 2002  ? Meniscus tear  ? LITHOTRIPSY  12/13/2020  ? ? ? ? ? ?Home Medications   ? ?Prior to Admission medications   ?Medication Sig Start Date End Date Taking?  Authorizing Provider  ?albuterol (VENTOLIN HFA) 108 (90 Base) MCG/ACT inhaler Inhale 1-2 puffs into the lungs every 6 (six) hours as needed for wheezing or shortness of breath. 05/27/21  Yes Danyetta Gillham K, PA-C  ?predniSONE (DELTASONE) 10 MG tablet Take 2 tablets (20 mg total) by mouth daily for 4 days. 05/27/21 05/31/21 Yes Joslynn Jamroz, Junie Panning K, PA-C  ?brimonidine (ALPHAGAN) 0.2 % ophthalmic solution Place 1 drop into both eyes in the morning and at bedtime. 02/14/21 02/14/22  [provider]  ?cetirizine (ZYRTEC) 10 MG tablet Take 1 tablet by mouth daily. 04/21/21 04/21/22  [provider]  ?dorzolamide-timolol (COSOPT) 22.3-6.8 MG/ML ophthalmic solution Place 1 drop into both eyes 2 (two) times daily. 10/20/20   [provider]  ?latanoprost (XALATAN) 0.005 % ophthalmic solution Place 1 drop into both eyes at bedtime. 04/13/21   [provider]  ?lisinopril (ZESTRIL) 40 MG tablet Take 40 mg by mouth daily.    [provider]  ?meloxicam (MOBIC) 15 MG tablet Take 15 mg by mouth daily.    [provider]  ?senna-docusate (SENOKOT-S) 8.6-50 MG tablet Take 1 tablet by mouth at bedtime as needed for mild constipation. 12/27/20   Loleta Dicker, PA  ?tadalafil (CIALIS) 5 MG tablet Take 5 mg by mouth daily.    [provider]  ? ? ?Family History ?Family  History  ?Problem Relation Age of Onset  ? Heart disease Mother   ? Hypertension Mother   ? Diabetes Maternal Aunt   ? Cancer Maternal Uncle   ?     Prostate  ? Diabetes Paternal Aunt   ? ? ?Social History ?Social History  ? ?Tobacco Use  ? Smoking status: Former  ?  Packs/day: 0.50  ?  Years: 40.00  ?  Pack years: 20.00  ?  Types: Cigarettes  ?  Quit date: 07/2019  ?  Years since quitting: 1.8  ? Smokeless tobacco: Never  ?Vaping Use  ? Vaping Use: Never used  ?Substance Use Topics  ? Alcohol use: No  ? Drug use: Not Currently  ?  Types: Marijuana  ?  Comment: History of cocaine use.   ? ? ? ?Allergies   ?Patient has no  known allergies. ? ? ?Review of Systems ?Review of Systems  ?Constitutional:  Positive for activity change. Negative for appetite change, fatigue and fever.  ?HENT:  Positive for congestion. Negative for sinus pressure, sneezing and sore throat.   ?Respiratory:  Positive for cough, chest tightness, shortness of breath and wheezing.   ?Cardiovascular:  Negative for chest pain.  ?Gastrointestinal:  Negative for abdominal pain, diarrhea, nausea and vomiting.  ?Neurological:  Negative for dizziness, light-headedness and headaches.  ? ? ?Physical Exam ?Triage Vital Signs ?ED Triage Vitals  ?Enc Vitals Group  ?   BP 05/27/21 1659 (!) 168/93  ?   Pulse Rate 05/27/21 1659 73  ?   Resp 05/27/21 1659 18  ?   Temp --   ?   Temp src --   ?   SpO2 05/27/21 1659 98 %  ?   Weight --   ?   Height --   ?   Head Circumference --   ?   Peak Flow --   ?   Pain Score 05/27/21 1657 0  ?   Pain Loc --   ?   Pain Edu? --   ?   Excl. in [place]? --   ? ?No data found. ? ?Updated Vital Signs ?BP (!) 159/88   Pulse 72   Resp (!) 22   SpO2 100%  ? ?Visual Acuity ?Right Eye Distance:   ?Left Eye Distance:   ?Bilateral Distance:   ? ?Right Eye Near:   ?Left Eye Near:    ?Bilateral Near:    ? ?Physical Exam ?Vitals reviewed.  ?Constitutional:   ?   General: He is awake.  ?   Appearance: Normal appearance. He is well-developed. He is not ill-appearing.  ?   Comments: Very pleasant male appears stated age in no acute distress sitting comfortably in exam room  ?HENT:  ?   Head: Normocephalic and atraumatic.  ?   Mouth/Throat:  ?   Pharynx: No oropharyngeal exudate, posterior oropharyngeal erythema or uvula swelling.  ?Cardiovascular:  ?   Rate and Rhythm: Normal rate and regular rhythm.  ?   Heart sounds: Normal heart sounds, S1 normal and S2 normal. No murmur heard. ?Pulmonary:  ?   Effort: Pulmonary effort is normal.  ?   Breath sounds: No stridor. Wheezing and rhonchi present. No rales.  ?   Comments: Widespread wheezing and rhonchi improved with  cough. ?Neurological:  ?   Mental Status: He is alert.  ?Psychiatric:     ?   Behavior: Behavior is cooperative.  ? ? ? ?UC Treatments / Results  ?Labs ?(all labs ordered are  listed, but only abnormal results are displayed) ?Labs Reviewed  ?CBG MONITORING, ED  ? ? ?EKG ? ? ?Radiology ?No results found. ? ?Procedures ?Procedures (including critical care time) ? ?Medications Ordered in UC ?Medications  ?ipratropium-albuterol (DUONEB) 0.5-2.5 (3) MG/3ML nebulizer solution 3 mL (3 mLs Nebulization Given 05/27/21 1713)  ?methylPREDNISolone sodium succinate (SOLU-MEDROL) 125 mg/2 mL injection 80 mg (80 mg Intramuscular Given 05/27/21 1750)  ?albuterol (VENTOLIN HFA) 108 (90 Base) MCG/ACT inhaler 2 puff (2 puffs Inhalation Given 05/27/21 1832)  ? ? ?Initial Impression / Assessment and Plan / UC Course  ?I have reviewed the triage vital signs and the nursing notes. ? ?Pertinent labs & imaging results that were available during my care of the patient were reviewed by me and considered in my medical decision making (see chart for details). ? ?  ? ?Patient was given DuoNeb and Solu-Medrol in clinic with significant improvement of wheezing and shortness of breath symptoms.  We discussed that this can cause hyperglycemia but blood sugar was appropriate at 91 today prior to administration of steroids.  We will start a lower dose prednisone of 20 mg for several days and he was also sent in a prescription for albuterol to have on hand.  He was given albuterol inhaler in clinic with instruction on how to properly use this medication.  Discussed that while on prednisone he should avoid NSAIDs.  He is to avoid carbohydrates and drink plenty of fluid while taking steroids to prevent hyperglycemia.  He can use over-the-counter medications including Mucinex, Flonase, antihistamines for symptom relief.  He is to rest and drink plenty of fluid.  Discussed that if he is having to use his albuterol frequently or develops any worsening symptoms  including chest pain, shortness of breath, lightheadedness he needs to be seen immediately.  Strict return precautions given to which he expressed understanding. ? ?Final Clinical Impressions(s) / UC Diagnose

## 2021-05-27 NOTE — ED Triage Notes (Addendum)
Pt reports he is Short of breath with activity since Wed. Pt also reports fatigue ,cough and congestion. 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 Pt used Lysol and Clorox to clean with . Pt now is SHOB  with a childhood hx of asthma. ?

## 2021-05-27 NOTE — Discharge Instructions (Signed)
We gave you an injection of steroids today.  Please start prednisone tomorrow.  As we discussed, this can raise your blood sugar so please avoid carbohydrates including sugar and drink lots of water.  I have called in albuterol inhaler.  Use this every 4-6 hours as needed.  If you are having to use this regularly or if it stops helping you need to go to the emergency room.  If anything worsens and you have shortness of breath, chest discomfort, lightheadedness you need to go to the emergency room.  Please follow-up with your primary care first thing next week. ?

## 2021-05-28 ENCOUNTER — Telehealth (HOSPITAL_COMMUNITY): Payer: Self-pay

## 2021-05-28 MED ORDER — ALBUTEROL SULFATE HFA 108 (90 BASE) MCG/ACT IN AERS
1.0000 | INHALATION_SPRAY | Freq: Four times a day (QID) | RESPIRATORY_TRACT | 1 refills | Status: DC | PRN
Start: 2021-05-28 — End: 2022-10-11

## 2021-05-28 MED ORDER — PREDNISONE 10 MG PO TABS: 20.0000 mg | ORAL_TABLET | Freq: Every day | ORAL | 0 refills | Status: AC

## 2021-06-04 ENCOUNTER — Ambulatory Visit
Admission: EM | Admit: 2021-06-04 | Discharge: 2021-06-04 | Disposition: A | Discharge status: Home / Self Care | Payer: 59 | Attending: Family Medicine | Admitting: Family Medicine

## 2021-06-04 ENCOUNTER — Encounter: Payer: Self-pay | Admitting: Emergency Medicine

## 2021-06-04 DIAGNOSIS — R8290 Unspecified abnormal findings in urine: Secondary | ICD-10-CM | POA: Insufficient documentation

## 2021-06-04 DIAGNOSIS — N3001 Acute cystitis with hematuria: Secondary | ICD-10-CM | POA: Diagnosis not present

## 2021-06-04 DIAGNOSIS — M25512 Pain in left shoulder: Secondary | ICD-10-CM | POA: Insufficient documentation

## 2021-06-04 DIAGNOSIS — D1722 Benign lipomatous neoplasm of skin and subcutaneous tissue of left arm: Secondary | ICD-10-CM | POA: Diagnosis not present

## 2021-06-04 LAB — POCT URINALYSIS DIP (MANUAL ENTRY)
Bilirubin, UA: NEGATIVE
Glucose, UA: NEGATIVE mg/dL
Ketones, POC UA: NEGATIVE mg/dL
Nitrite, UA: NEGATIVE
Spec Grav, UA: 1.025 (ref 1.010–1.025)
Urobilinogen, UA: 0.2 E.U./dL
pH, UA: 6 (ref 5.0–8.0)

## 2021-06-04 MED ORDER — KETOROLAC TROMETHAMINE 30 MG/ML IJ SOLN
30.0000 mg | Freq: Once | INTRAMUSCULAR | Status: AC
  Administered 2021-06-04: 30 mg via INTRAMUSCULAR

## 2021-06-04 MED ORDER — SULFAMETHOXAZOLE-TRIMETHOPRIM 800-160 MG PO TABS: 1.0000 | ORAL_TABLET | Freq: Two times a day (BID) | ORAL | 0 refills | Status: AC

## 2021-06-04 NOTE — ED Provider Notes (Signed)
?[facility] ? ? ? ?CSN: [id number] ?Arrival date & time: 06/04/21  0944 ? ? ?  ? ?History   ?Chief Complaint ?Chief Complaint  ?Patient presents with  ? Cyst  ? Urinary Symptoms  ? ? ?HPI ?Caleb Wall is a 66 y.o. male.  ? 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  Patient suffers from type 2 diabetes and reports he is experienced dysuria along with urinary frequency.  He denies any flank pain.  He endorses left shoulder pain.  Patient has a palpable soft lipoma present on the left upper chest wall expanding to his shoulder.  He reports his doctor is already spoken with him about a surgical consult.  He has not seen a Psychologist, sport and exercise at present.  He reports the lipoma is painful.  Denies any redness, fever, chills. ? ? ?Past Medical History:  ?Diagnosis Date  ? Allergy   ? Seasonal  ? Diabetes mellitus without complication ([place])   ? Glaucoma   ? Hepatitis C   ? Hypertension   ? Prediabetes   ? ? ?Patient Active Problem List  ? Diagnosis Date Noted  ? Cervical myelopathy ([place]) 12/26/2020  ? Hypertension 02/23/2014  ? Plantar wart 02/23/2014  ? Benign prostatic hyperplasia 02/23/2014  ? Diabetes ([place]) 04/23/2013  ? ? ?Past Surgical History:  ?Procedure Laterality Date  ? ANTERIOR CERVICAL DECOMPRESSION/DISCECTOMY FUSION 4 LEVELS N/A 12/26/2020  ? Procedure: C3-6 ANTERIOR CERVICAL DECOMPRESSION/DISCECTOMY FUSION 3 LEVELS;  Surgeon: Deetta Perla, MD;  Location: [facility] ORS;  Service: Neurosurgery;  Laterality: N/A;  ? JOINT REPLACEMENT Left 02/2020  ? KNEE SURGERY Left 2002  ? Meniscus tear  ? LITHOTRIPSY  12/13/2020  ? ? ? ? ? ?Home Medications   ? ?Prior to Admission medications   ?Medication Sig Start Date End Date Taking? Authorizing Provider  ?sulfamethoxazole-trimethoprim (BACTRIM DS) 800-160 MG tablet Take 1 tablet by mouth 2 (two) times daily for 7 days. 06/04/21 06/11/21 Yes Scot Jun, FNP  ?albuterol (VENTOLIN HFA) 108 (90 Base)  MCG/ACT inhaler Inhale 1-2 puffs into the lungs every 6 (six) hours as needed for wheezing or shortness of breath. 05/28/21   Raspet, Derry Skill, PA-C  ?brimonidine (ALPHAGAN) 0.2 % ophthalmic solution Place 1 drop into both eyes in the morning and at bedtime. 02/14/21 02/14/22  [provider]  ?cetirizine (ZYRTEC) 10 MG tablet Take 1 tablet by mouth daily. 04/21/21 04/21/22  [provider]  ?dorzolamide-timolol (COSOPT) 22.3-6.8 MG/ML ophthalmic solution Place 1 drop into both eyes 2 (two) times daily. 10/20/20   [provider]  ?latanoprost (XALATAN) 0.005 % ophthalmic solution Place 1 drop into both eyes at bedtime. 04/13/21   [provider]  ?lisinopril (ZESTRIL) 40 MG tablet Take 40 mg by mouth daily.    [provider]  ?meloxicam (MOBIC) 15 MG tablet Take 15 mg by mouth daily.    [provider]  ?nitrofurantoin, macrocrystal-monohydrate, (MACROBID) 100 MG capsule Take 1 capsule (100 mg total) by mouth 2 (two) times daily. 06/06/21   LampteyMyrene Galas, MD  ?oxybutynin (DITROPAN) 5 MG tablet Take 5 mg by mouth 3 (three) times daily. 05/25/21   [provider]  ?senna-docusate (SENOKOT-S) 8.6-50 MG tablet Take 1 tablet by mouth at bedtime as needed for mild constipation. 12/27/20   Loleta Dicker, PA  ?tadalafil (CIALIS) 5 MG tablet Take 5 mg by mouth daily.    [provider]  ? ? ?  Family History ?Family History  ?Problem Relation Age of Onset  ? Heart disease Mother   ? Hypertension Mother   ? Diabetes Maternal Aunt   ? Cancer Maternal Uncle   ?     Prostate  ? Diabetes Paternal Aunt   ? ? ?Social History ?Social History  ? ?Tobacco Use  ? Smoking status: Former  ?  Packs/day: 0.50  ?  Years: 40.00  ?  Pack years: 20.00  ?  Types: Cigarettes  ?  Quit date: 07/2019  ?  Years since quitting: 1.8  ? Smokeless tobacco: Never  ?Vaping Use  ? Vaping Use: Never used  ?Substance Use Topics  ? Alcohol use: No  ? Drug use: Not Currently  ?  Types: Marijuana   ?  Comment: History of cocaine use.   ? ? ? ?Allergies   ?Patient has no known allergies. ? ? ?Review of Systems ?Review of Systems ?Pertinent negatives listed in HPI  ? ?Physical Exam ?Triage Vital Signs ?ED Triage Vitals  ?Enc Vitals Group  ?   BP 06/04/21 1039 118/81  ?   Pulse Rate 06/04/21 1039 81  ?   Resp 06/04/21 1039 18  ?   Temp 06/04/21 1039 97.9 ?F (36.6 ?C)  ?   Temp Source 06/04/21 1039 Oral  ?   SpO2 06/04/21 1039 93 %  ?   Weight --   ?   Height --   ?   Head Circumference --   ?   Peak Flow --   ?   Pain Score 06/04/21 1038 10  ?   Pain Loc --   ?   Pain Edu? --   ?   Excl. in [place]? --   ? ?No data found. ? ?Updated Vital Signs ?BP 118/81 (BP Location: Left Arm)   Pulse 81   Temp 97.9 ?F (36.6 ?C) (Oral)   Resp 18   SpO2 93%  ? ?Visual Acuity ?Right Eye Distance:   ?Left Eye Distance:   ?Bilateral Distance:   ? ?Right Eye Near:   ?Left Eye Near:    ?Bilateral Near:    ? ?Physical Exam ?HENT:  ?   Head: Normocephalic and atraumatic.  ?Cardiovascular:  ?   Rate and Rhythm: Normal rate and regular rhythm.  ?Abdominal:  ?   General: Bowel sounds are normal.  ?   Palpations: Abdomen is soft.  ?   Tenderness: There is no right CVA tenderness, left CVA tenderness or guarding.  ?Musculoskeletal:  ?     Arms: ? ?   Cervical back: Normal range of motion. No rigidity.  ?Skin: ?   Capillary Refill: Capillary refill takes less than 2 seconds.  ?Neurological:  ?   General: No focal deficit present.  ?   Mental Status: He is oriented to person, place, and time.  ?Psychiatric:     ?   Mood and Affect: Mood normal.  ? ? ? ?UC Treatments / Results  ?Labs ?(all labs ordered are listed, but only abnormal results are displayed) ?Labs Reviewed  ?URINE CULTURE - Abnormal; Notable for the following components:  ?    Result Value  ? Culture >=100,000 COLONIES/mL ENTEROCOCCUS FAECALIS (*)   ? Organism ID, Bacteria ENTEROCOCCUS FAECALIS (*)   ? All other components within normal limits  ?POCT URINALYSIS DIP (MANUAL ENTRY)  - Abnormal; Notable for the following components:  ? Clarity, UA cloudy (*)   ? Blood, UA trace-intact (*)   ? Protein Ur,  POC trace (*)   ? Leukocytes, UA Small (1+) (*)   ? All other components within normal limits  ? ? ?EKG ? ? ?Radiology ?No results found. ? ?Procedures ?Procedures (including critical care time) ? ?Medications Ordered in UC ?Medications  ?ketorolac (TORADOL) 30 MG/ML injection 30 mg (30 mg Intramuscular Given 06/04/21 1056)  ? ? ?Initial Impression / Assessment and Plan / UC Course  ?I have reviewed the triage vital signs and the nursing notes. ? ?Pertinent labs & imaging results that were available during my care of the patient were reviewed by me and considered in my medical decision making (see chart for details). ? ?  ?Treat empirically for acute cystitis on symptoms and UA results.  Will discharge home with Bactrim while urine culture pending.  Patient encouraged to drink plenty of water and monitor blood sugars.  Due to diabetes he is at increased risk for urosepsis.  In regards to lipoma left shoulder pain patient given a low-dose of Toradol while here in clinic.  Patient advised to follow-up with PCP to discuss surgical consult.  If any symptoms worsen or do not readily improve go immediately to the ER. ?Final Clinical Impressions(s) / UC Diagnoses  ? ?Final diagnoses:  ?Abnormal urine odor  ?Acute cystitis with hematuria  ?Lipoma of left shoulder  ?Acute pain of left shoulder  ? ? ? ?Discharge Instructions   ? ?  ?Follow-up with your Primary Care doctor regarding referral for general surgery to remove cyst (LIPOMA) from left shoulder. ?I am culturing your urine. Start Bactrim twice daily for treatment of suspected urinary tract infection Drink plenty of water. ?Resume Meloxicam at bedtime as needed for pain. ? ? ? ? ?ED Prescriptions   ? ? Medication Sig Dispense Auth. Provider  ? sulfamethoxazole-trimethoprim (BACTRIM DS) 800-160 MG tablet Take 1 tablet by mouth 2 (two) times daily for  7 days. 14 tablet Scot Jun, FNP  ? ?  ? ?PDMP not reviewed this encounter. ?  ?Scot Jun, FNP ?06/07/21 [phone] ? ?

## 2021-06-04 NOTE — Discharge Instructions (Signed)
Follow-up with your Primary Care doctor regarding referral for general surgery to remove cyst (LIPOMA) from left shoulder. ?I am culturing your urine. Start Bactrim twice daily for treatment of suspected urinary tract infection Drink plenty of water. ?Resume Meloxicam at bedtime as needed for pain. ?

## 2021-06-04 NOTE — ED Triage Notes (Signed)
Pt presents with a cyst near his left shoulder that he has had for several years but it started hurting him a few days ago. He was advised by his PCP to see a Psychologist, sport and exercise.  ? ?He also has an odor to his urine x 3 days  ?

## 2021-06-06 ENCOUNTER — Telehealth (HOSPITAL_COMMUNITY): Payer: Self-pay | Admitting: Emergency Medicine

## 2021-06-06 LAB — URINE CULTURE
Culture: >=100000 — AB
Special Requests: NORMAL

## 2021-06-06 MED ORDER — NITROFURANTOIN MONOHYD MACRO 100 MG PO CAPS: 100.0000 mg | ORAL_CAPSULE | Freq: Two times a day (BID) | ORAL | 0 refills | Status: DC

## 2021-06-07 DIAGNOSIS — M4312 Spondylolisthesis, cervical region: Secondary | ICD-10-CM | POA: Diagnosis not present

## 2021-06-07 DIAGNOSIS — M50322 Other cervical disc degeneration at C5-C6 level: Secondary | ICD-10-CM | POA: Diagnosis not present

## 2021-06-07 DIAGNOSIS — Z981 Arthrodesis status: Secondary | ICD-10-CM | POA: Diagnosis not present

## 2021-06-07 DIAGNOSIS — M4802 Spinal stenosis, cervical region: Secondary | ICD-10-CM | POA: Diagnosis not present

## 2021-06-07 DIAGNOSIS — G959 Disease of spinal cord, unspecified: Secondary | ICD-10-CM | POA: Diagnosis not present

## 2021-06-07 DIAGNOSIS — M47812 Spondylosis without myelopathy or radiculopathy, cervical region: Secondary | ICD-10-CM | POA: Diagnosis not present

## 2021-06-08 DIAGNOSIS — N200 Calculus of kidney: Secondary | ICD-10-CM | POA: Diagnosis not present

## 2021-06-30 DIAGNOSIS — Z1211 Encounter for screening for malignant neoplasm of colon: Secondary | ICD-10-CM | POA: Diagnosis not present

## 2021-06-30 DIAGNOSIS — Z8619 Personal history of other infectious and parasitic diseases: Secondary | ICD-10-CM | POA: Diagnosis not present

## 2021-07-04 DIAGNOSIS — M461 Sacroiliitis, not elsewhere classified: Secondary | ICD-10-CM | POA: Diagnosis not present

## 2021-07-14 DIAGNOSIS — E119 Type 2 diabetes mellitus without complications: Secondary | ICD-10-CM | POA: Diagnosis not present

## 2021-07-17 ENCOUNTER — Other Ambulatory Visit: Payer: Self-pay | Admitting: Orthopedic Surgery

## 2021-07-18 DIAGNOSIS — Z981 Arthrodesis status: Secondary | ICD-10-CM | POA: Diagnosis not present

## 2021-07-18 DIAGNOSIS — M4312 Spondylolisthesis, cervical region: Secondary | ICD-10-CM | POA: Diagnosis not present

## 2021-07-18 DIAGNOSIS — Z09 Encounter for follow-up examination after completed treatment for conditions other than malignant neoplasm: Secondary | ICD-10-CM | POA: Diagnosis not present

## 2021-07-20 ENCOUNTER — Encounter
Admission: RE | Admit: 2021-07-20 | Discharge: 2021-07-20 | Disposition: A | Discharge status: Home / Self Care | Payer: 59 | Source: Ambulatory Visit | Attending: Orthopedic Surgery | Admitting: Orthopedic Surgery

## 2021-07-20 VITALS — Ht 67.0 in | Wt 235.0 lb

## 2021-07-20 DIAGNOSIS — Z981 Arthrodesis status: Secondary | ICD-10-CM | POA: Diagnosis not present

## 2021-07-20 DIAGNOSIS — E119 Type 2 diabetes mellitus without complications: Secondary | ICD-10-CM

## 2021-07-20 DIAGNOSIS — E7849 Other hyperlipidemia: Secondary | ICD-10-CM | POA: Diagnosis not present

## 2021-07-20 DIAGNOSIS — Z6841 Body Mass Index (BMI) 40.0 and over, adult: Secondary | ICD-10-CM | POA: Diagnosis not present

## 2021-07-20 DIAGNOSIS — I10 Essential (primary) hypertension: Secondary | ICD-10-CM

## 2021-07-20 DIAGNOSIS — D649 Anemia, unspecified: Secondary | ICD-10-CM | POA: Diagnosis not present

## 2021-07-20 HISTORY — DX: Personal history of urinary calculi: Z87.442

## 2021-07-20 NOTE — Patient Instructions (Addendum)
Your procedure is scheduled on: Tuesday July 25, 2021. Report to Day Surgery inside [facility] 2nd floor, stop by admissions desk before getting on elevator. To find out your arrival time please call [phone] between 1PM - 3PM on Monday July 24, 2021.  Remember: Instructions that are not followed completely may result in serious medical risk,  up to and including death, or upon the discretion of your surgeon and anesthesiologist your  surgery may need to be rescheduled.     _X__ 1. Do not eat food or drink fluids after midnight the night before your procedure.                 No chewing gum or hard candies.   __X__2.  On the morning of surgery brush your teeth with toothpaste and water, you                may rinse your mouth with mouthwash if you wish.  Do not swallow any toothpaste or mouthwash.     _X__ 3.  No Alcohol for 24 hours before or after surgery.   _X__ 4.  Do Not Smoke or use e-cigarettes For 24 Hours Prior to Your Surgery.                 Do not use any chewable tobacco products for at least 6 hours prior to                 Surgery.  _X__  5.  Do not use any recreational drugs (marijuana, cocaine, heroin, ecstasy, MDMA or other)                For at least one week prior to your surgery.  Combination of these drugs with anesthesia                May have life threatening results.  ____  6.  Bring all medications with you on the day of surgery if instructed.   __X__  7.  Notify your doctor if there is any change in your medical condition      (cold, fever, infections).     Do not wear jewelry, make-up, hairpins, clips or nail polish. Do not wear lotions, powders, or perfumes deodorant. Do not shave 48 hours prior to surgery. Men may shave face and neck. Do not bring valuables to the hospital.    [facility] is not responsible for any belongings or valuables.  Contacts, dentures or bridgework may not be worn into surgery. Leave your  suitcase in the car. After surgery it may be brought to your room. For patients admitted to the hospital, discharge time is determined by your treatment team.   Patients discharged the day of surgery will not be allowed to drive home.   Make arrangements for someone to be with you for the first 24 hours of your Same Day Discharge.   __X__ Take these medicines the morning of surgery with A SIP OF WATER:    1. None   2.   3.   4.  5.  6.  ____ Fleet Enema (as directed)   __X__ Use CHG Soap (or wipes) as directed  ____ Use Benzoyl Peroxide Gel as instructed  __X__ Use inhalers on the day of surgery  albuterol (VENTOLIN HFA) 108 (90 Base) MCG/ACT inhaler   ____ Stop metformin 2 days prior to surgery    ____ Take 1/2 of usual insulin dose the night before surgery. No insulin the  morning          of surgery.   ____ Call your PCP, cardiologist, or Pulmonologist if taking Coumadin/Plavix/aspirin and ask when to stop before your surgery.   __X__ One Week prior to surgery- Stop Anti-inflammatories such as Ibuprofen, Aleve, Advil, Motrin, meloxicam (MOBIC), diclofenac, etodolac, ketorolac, Toradol, Daypro, piroxicam, Goody's or BC powders. OK TO USE TYLENOL IF NEEDED   __X__ Stop supplements until after surgery.    ____ Bring C-Pap to the hospital.    If you have any questions regarding your pre-procedure instructions,  Please call Pre-admit Testing at [phone]

## 2021-07-21 ENCOUNTER — Encounter
Admission: RE | Admit: 2021-07-21 | Discharge: 2021-07-21 | Disposition: A | Discharge status: Home / Self Care | Payer: Worker's Compensation | Source: Ambulatory Visit | Attending: Orthopedic Surgery | Admitting: Orthopedic Surgery

## 2021-07-21 DIAGNOSIS — Z01818 Encounter for other preprocedural examination: Secondary | ICD-10-CM | POA: Insufficient documentation

## 2021-07-21 DIAGNOSIS — I10 Essential (primary) hypertension: Secondary | ICD-10-CM | POA: Insufficient documentation

## 2021-07-21 DIAGNOSIS — E119 Type 2 diabetes mellitus without complications: Secondary | ICD-10-CM | POA: Insufficient documentation

## 2021-07-21 LAB — BASIC METABOLIC PANEL
Anion gap: 5 (ref 5–15)
BUN: 19 mg/dL (ref 8–23)
CO2: 23 mmol/L (ref 22–32)
Calcium: 9.3 mg/dL (ref 8.9–10.3)
Chloride: 110 mmol/L (ref 98–111)
Creatinine, Ser: 0.84 mg/dL (ref 0.61–1.24)
GFR, Estimated: >60 mL/min (ref >60)
Glucose, Bld: 119 mg/dL — ABNORMAL HIGH (ref 70–99)
Potassium: 3.7 mmol/L (ref 3.5–5.1)
Sodium: 138 mmol/L (ref 135–145)

## 2021-07-21 LAB — CBC
HCT: 37.1 % — ABNORMAL LOW (ref 39.0–52.0)
Hemoglobin: 11.9 g/dL — ABNORMAL LOW (ref 13.0–17.0)
MCH: 27.6 pg (ref 26.0–34.0)
MCHC: 32.1 g/dL (ref 30.0–36.0)
MCV: 86.1 fL (ref 80.0–100.0)
Platelets: 230 10*3/uL (ref 150–400)
RBC: 4.31 MIL/uL (ref 4.22–5.81)
RDW: 14.5 % (ref 11.5–15.5)
WBC: 6.5 10*3/uL (ref 4.0–10.5)
nRBC: 0 % (ref 0.0–0.2)

## 2021-07-24 MED ORDER — FAMOTIDINE 20 MG PO TABS: 20.0000 mg | ORAL_TABLET | Freq: Once | ORAL | Status: AC

## 2021-07-24 MED ORDER — CHLORHEXIDINE GLUCONATE CLOTH 2 % EX PADS
6.0000 | MEDICATED_PAD | Freq: Once | CUTANEOUS | Status: AC
  Administered 2021-07-25: 6 via TOPICAL

## 2021-07-24 MED ORDER — ACETAMINOPHEN 500 MG PO TABS: 1000.0000 mg | ORAL_TABLET | ORAL | Status: AC

## 2021-07-24 MED ORDER — CHLORHEXIDINE GLUCONATE CLOTH 2 % EX PADS: 6.0000 | MEDICATED_PAD | Freq: Once | CUTANEOUS | Status: DC

## 2021-07-24 MED ORDER — CHLORHEXIDINE GLUCONATE 0.12 % MT SOLN: 15.0000 mL | Freq: Once | OROMUCOSAL | Status: AC

## 2021-07-24 MED ORDER — CEFAZOLIN SODIUM-DEXTROSE 2-4 GM/100ML-% IV SOLN
2.0000 g | INTRAVENOUS | Status: AC
  Administered 2021-07-25: 2 g via INTRAVENOUS

## 2021-07-24 MED ORDER — SODIUM CHLORIDE 0.9 % IV SOLN: INTRAVENOUS | Status: DC

## 2021-07-24 MED ORDER — ORAL CARE MOUTH RINSE: 15.0000 mL | Freq: Once | OROMUCOSAL | Status: AC

## 2021-07-25 ENCOUNTER — Ambulatory Visit
Admission: RE | Admit: 2021-07-25 | Discharge: 2021-07-25 | Disposition: A | Discharge status: Home / Self Care | Payer: Worker's Compensation | Attending: Orthopedic Surgery | Admitting: Orthopedic Surgery

## 2021-07-25 ENCOUNTER — Ambulatory Visit: Payer: Worker's Compensation | Admitting: Urgent Care

## 2021-07-25 ENCOUNTER — Ambulatory Visit: Payer: Worker's Compensation

## 2021-07-25 ENCOUNTER — Encounter: Payer: Self-pay | Admitting: Orthopedic Surgery

## 2021-07-25 ENCOUNTER — Encounter
Admission: RE | Disposition: A | Discharge status: Home / Self Care | Payer: Self-pay | Source: Home / Self Care | Attending: Orthopedic Surgery

## 2021-07-25 ENCOUNTER — Ambulatory Visit: Payer: Worker's Compensation | Admitting: Certified Registered"

## 2021-07-25 ENCOUNTER — Other Ambulatory Visit: Payer: Self-pay

## 2021-07-25 DIAGNOSIS — M19012 Primary osteoarthritis, left shoulder: Secondary | ICD-10-CM | POA: Insufficient documentation

## 2021-07-25 DIAGNOSIS — I10 Essential (primary) hypertension: Secondary | ICD-10-CM | POA: Diagnosis not present

## 2021-07-25 DIAGNOSIS — M75102 Unspecified rotator cuff tear or rupture of left shoulder, not specified as traumatic: Secondary | ICD-10-CM | POA: Diagnosis not present

## 2021-07-25 DIAGNOSIS — Z8619 Personal history of other infectious and parasitic diseases: Secondary | ICD-10-CM | POA: Diagnosis not present

## 2021-07-25 DIAGNOSIS — Z87891 Personal history of nicotine dependence: Secondary | ICD-10-CM | POA: Insufficient documentation

## 2021-07-25 DIAGNOSIS — R7303 Prediabetes: Secondary | ICD-10-CM | POA: Diagnosis not present

## 2021-07-25 DIAGNOSIS — Z79899 Other long term (current) drug therapy: Secondary | ICD-10-CM | POA: Insufficient documentation

## 2021-07-25 DIAGNOSIS — G8918 Other acute postprocedural pain: Secondary | ICD-10-CM | POA: Diagnosis not present

## 2021-07-25 DIAGNOSIS — M25812 Other specified joint disorders, left shoulder: Secondary | ICD-10-CM | POA: Diagnosis not present

## 2021-07-25 DIAGNOSIS — M25512 Pain in left shoulder: Secondary | ICD-10-CM | POA: Diagnosis not present

## 2021-07-25 HISTORY — PX: SHOULDER ARTHROSCOPY WITH OPEN ROTATOR CUFF REPAIR AND DISTAL CLAVICLE ACROMINECTOMY: SHX5683

## 2021-07-25 LAB — GLUCOSE, CAPILLARY
Glucose-Capillary: 114 mg/dL — ABNORMAL HIGH (ref 70–99)
Glucose-Capillary: 150 mg/dL — ABNORMAL HIGH (ref 70–99)

## 2021-07-25 SURGERY — SHOULDER ARTHROSCOPY WITH OPEN ROTATOR CUFF REPAIR AND DISTAL CLAVICLE ACROMINECTOMY
Anesthesia: General | Laterality: Left

## 2021-07-25 MED ORDER — BUPIVACAINE HCL (PF) 0.5 % IJ SOLN
INTRAMUSCULAR | Status: DC | PRN
  Administered 2021-07-25: 10 mL via PERINEURAL

## 2021-07-25 MED ORDER — LACTATED RINGERS IR SOLN
Status: DC | PRN
  Administered 2021-07-25: 12000 mL

## 2021-07-25 MED ORDER — PROMETHAZINE HCL 25 MG/ML IJ SOLN: 6.2500 mg | INTRAMUSCULAR | Status: DC | PRN

## 2021-07-25 MED ORDER — MIDAZOLAM HCL 2 MG/2ML IJ SOLN
INTRAMUSCULAR | Status: AC
  Filled 2021-07-25: qty 2

## 2021-07-25 MED ORDER — MIDAZOLAM HCL 5 MG/5ML IJ SOLN
INTRAMUSCULAR | Status: DC | PRN
  Administered 2021-07-25: 2 mg via INTRAVENOUS

## 2021-07-25 MED ORDER — ACETAMINOPHEN 10 MG/ML IV SOLN: 1000.0000 mg | Freq: Once | INTRAVENOUS | Status: DC | PRN

## 2021-07-25 MED ORDER — BUPIVACAINE-EPINEPHRINE (PF) 0.25% -1:200000 IJ SOLN
INTRAMUSCULAR | Status: AC
  Filled 2021-07-25: qty 30

## 2021-07-25 MED ORDER — OXYCODONE HCL 5 MG/5ML PO SOLN: 5.0000 mg | Freq: Once | ORAL | Status: DC | PRN

## 2021-07-25 MED ORDER — FENTANYL CITRATE (PF) 100 MCG/2ML IJ SOLN
INTRAMUSCULAR | Status: AC
  Filled 2021-07-25: qty 2

## 2021-07-25 MED ORDER — FENTANYL CITRATE (PF) 100 MCG/2ML IJ SOLN: 25.0000 ug | INTRAMUSCULAR | Status: DC | PRN

## 2021-07-25 MED ORDER — ONDANSETRON HCL 4 MG/2ML IJ SOLN
INTRAMUSCULAR | Status: DC | PRN
  Administered 2021-07-25 (×2): 4 mg via INTRAVENOUS

## 2021-07-25 MED ORDER — PROPOFOL 10 MG/ML IV BOLUS
INTRAVENOUS | Status: DC | PRN
  Administered 2021-07-25: 130 mg via INTRAVENOUS

## 2021-07-25 MED ORDER — ONDANSETRON HCL 4 MG PO TABS: 4.0000 mg | ORAL_TABLET | Freq: Three times a day (TID) | ORAL | 0 refills | Status: DC | PRN

## 2021-07-25 MED ORDER — EPHEDRINE SULFATE (PRESSORS) 50 MG/ML IJ SOLN
INTRAMUSCULAR | Status: DC | PRN
  Administered 2021-07-25: 2.5 mg via INTRAVENOUS
  Administered 2021-07-25: 5 mg via INTRAVENOUS

## 2021-07-25 MED ORDER — BUPIVACAINE HCL (PF) 0.5 % IJ SOLN
INTRAMUSCULAR | Status: AC
  Filled 2021-07-25: qty 10

## 2021-07-25 MED ORDER — DEXAMETHASONE SODIUM PHOSPHATE 10 MG/ML IJ SOLN
INTRAMUSCULAR | Status: DC | PRN
  Administered 2021-07-25: 5 mg via INTRAVENOUS

## 2021-07-25 MED ORDER — LIDOCAINE HCL (PF) 1 % IJ SOLN
INTRAMUSCULAR | Status: DC | PRN
  Administered 2021-07-25: 1 mL via SUBCUTANEOUS

## 2021-07-25 MED ORDER — FENTANYL CITRATE (PF) 100 MCG/2ML IJ SOLN
INTRAMUSCULAR | Status: DC | PRN
Start: 2021-07-25 — End: 2021-07-25
  Administered 2021-07-25: 50 ug via INTRAVENOUS

## 2021-07-25 MED ORDER — ROCURONIUM BROMIDE 100 MG/10ML IV SOLN
INTRAVENOUS | Status: DC | PRN
  Administered 2021-07-25: 60 mg via INTRAVENOUS

## 2021-07-25 MED ORDER — OXYCODONE HCL 5 MG PO TABS: 5.0000 mg | ORAL_TABLET | ORAL | 0 refills | Status: DC | PRN

## 2021-07-25 MED ORDER — BUPIVACAINE LIPOSOME 1.3 % IJ SUSP
INTRAMUSCULAR | Status: AC
  Filled 2021-07-25: qty 10

## 2021-07-25 MED ORDER — ACETAMINOPHEN 500 MG PO TABS
ORAL_TABLET | ORAL | Status: AC
  Administered 2021-07-25: 1000 mg via ORAL
  Filled 2021-07-25: qty 2

## 2021-07-25 MED ORDER — DROPERIDOL 2.5 MG/ML IJ SOLN
0.6250 mg | Freq: Once | INTRAMUSCULAR | Status: DC | PRN
Start: 2021-07-25 — End: 2021-07-25

## 2021-07-25 MED ORDER — LIDOCAINE HCL (CARDIAC) PF 100 MG/5ML IV SOSY: PREFILLED_SYRINGE | INTRAVENOUS | Status: DC | PRN

## 2021-07-25 MED ORDER — BUPIVACAINE LIPOSOME 1.3 % IJ SUSP
INTRAMUSCULAR | Status: DC | PRN
  Administered 2021-07-25: 10 mL via PERINEURAL

## 2021-07-25 MED ORDER — FAMOTIDINE 20 MG PO TABS
ORAL_TABLET | ORAL | Status: AC
  Administered 2021-07-25: 20 mg via ORAL
  Filled 2021-07-25: qty 1

## 2021-07-25 MED ORDER — EPINEPHRINE PF 1 MG/ML IJ SOLN
INTRAMUSCULAR | Status: AC
  Filled 2021-07-25: qty 4

## 2021-07-25 MED ORDER — CHLORHEXIDINE GLUCONATE 0.12 % MT SOLN
OROMUCOSAL | Status: AC
  Administered 2021-07-25: 15 mL via OROMUCOSAL
  Filled 2021-07-25: qty 15

## 2021-07-25 MED ORDER — PHENYLEPHRINE HCL-NACL 20-0.9 MG/250ML-% IV SOLN
INTRAVENOUS | Status: DC | PRN
  Administered 2021-07-25: 15 ug/min via INTRAVENOUS

## 2021-07-25 MED ORDER — GLYCOPYRROLATE 0.2 MG/ML IJ SOLN
INTRAMUSCULAR | Status: DC | PRN
  Administered 2021-07-25: .2 mg via INTRAVENOUS

## 2021-07-25 MED ORDER — CEFAZOLIN SODIUM-DEXTROSE 2-4 GM/100ML-% IV SOLN
INTRAVENOUS | Status: AC
  Filled 2021-07-25: qty 100

## 2021-07-25 MED ORDER — SUGAMMADEX SODIUM 500 MG/5ML IV SOLN
INTRAVENOUS | Status: DC | PRN
  Administered 2021-07-25: 400 mg via INTRAVENOUS

## 2021-07-25 MED ORDER — OXYCODONE HCL 5 MG PO TABS: 5.0000 mg | ORAL_TABLET | Freq: Once | ORAL | Status: DC | PRN

## 2021-07-25 MED ORDER — LIDOCAINE HCL (PF) 1 % IJ SOLN
INTRAMUSCULAR | Status: AC
  Filled 2021-07-25: qty 2

## 2021-07-25 SURGICAL SUPPLY — 69 items
ADAPTER IRRIG TUBE 2 SPIKE SOL (ADAPTER) ×6 IMPLANT
ADPR TBG 2 SPK PMP STRL ASCP (ADAPTER) ×2
ANCH SUT 5.5 KNTLS (Anchor) ×1 IMPLANT
ANCH SUT 5.5 KNTLS PEEK (Orthopedic Implant) ×1 IMPLANT
ANCH SUT Q-FX 2.8 (Anchor) ×1 IMPLANT
ANCHOR ALL-SUT Q-FIX 2.8 (Anchor) ×9 IMPLANT
ANCHOR HEALICOIL REGEN 5.5 (Anchor) ×1 IMPLANT
ANCHOR SUT 5.5 MULTIFIX (Orthopedic Implant) ×1 IMPLANT
CANNULA 5.75X7 CRYSTAL CLEAR (CANNULA) ×4 IMPLANT
CANNULA PARTIAL THREAD 2X7 (CANNULA) ×3 IMPLANT
CANNULA TWIST IN 8.25X9CM (CANNULA) ×6 IMPLANT
CONNECTOR PERFECT PASSER (CONNECTOR) ×5 IMPLANT
COOLER POLAR GLACIER W/PUMP (MISCELLANEOUS) ×2 IMPLANT
DILATOR 5.5 THREADED HEALICOIL (MISCELLANEOUS) ×1 IMPLANT
DRAPE 3/4 80X56 (DRAPES) ×3 IMPLANT
DRAPE INCISE IOBAN 66X45 STRL (DRAPES) ×3 IMPLANT
DRAPE U-SHAPE 47X51 STRL (DRAPES) ×3 IMPLANT
DURAPREP 26ML APPLICATOR (WOUND CARE) ×9 IMPLANT
ELECT REM PT RETURN 9FT ADLT (ELECTROSURGICAL) ×2
ELECTRODE REM PT RTRN 9FT ADLT (ELECTROSURGICAL) ×2 IMPLANT
GAUZE SPONGE 4X4 12PLY STRL (GAUZE/BANDAGES/DRESSINGS) ×3 IMPLANT
GAUZE XEROFORM 1X8 LF (GAUZE/BANDAGES/DRESSINGS) ×3 IMPLANT
GLOVE BIOGEL PI IND STRL 9 (GLOVE) ×2 IMPLANT
GLOVE BIOGEL PI INDICATOR 9 (GLOVE) ×1
GLOVE SURG ORTHO 9.0 STRL STRW (GLOVE) ×9 IMPLANT
GOWN STRL REUS TWL 2XL XL LVL4 (GOWN DISPOSABLE) ×3 IMPLANT
GOWN STRL REUS W/ TWL LRG LVL3 (GOWN DISPOSABLE) ×2 IMPLANT
GOWN STRL REUS W/TWL LRG LVL3 (GOWN DISPOSABLE) ×2
IV LACTATED RINGER IRRG 3000ML (IV SOLUTION) ×8
IV LR IRRIG 3000ML ARTHROMATIC (IV SOLUTION) ×16 IMPLANT
KIT STABILIZATION SHOULDER (MISCELLANEOUS) ×3 IMPLANT
KIT SUTURE 2.8 Q-FIX DISP (MISCELLANEOUS) ×3 IMPLANT
KIT TURNOVER KIT A (KITS) ×3 IMPLANT
MANIFOLD NEPTUNE II (INSTRUMENTS) ×6 IMPLANT
MASK FACE SPIDER DISP (MASK) ×3 IMPLANT
MAT ABSORB  FLUID 56X50 GRAY (MISCELLANEOUS) ×6
MAT ABSORB FLUID 56X50 GRAY (MISCELLANEOUS) ×6 IMPLANT
NDL SAFETY ECLIPSE 18X1.5 (NEEDLE) ×2 IMPLANT
NEEDLE HYPO 18GX1.5 SHARP (NEEDLE) ×2
NEEDLE HYPO 22GX1.5 SAFETY (NEEDLE) ×3 IMPLANT
NS IRRIG 500ML POUR BTL (IV SOLUTION) ×3 IMPLANT
PACK ARTHROSCOPY SHOULDER (MISCELLANEOUS) ×3 IMPLANT
PAD ABD DERMACEA PRESS 5X9 (GAUZE/BANDAGES/DRESSINGS) ×3 IMPLANT
PAD ARMBOARD 7.5X6 YLW CONV (MISCELLANEOUS) ×6 IMPLANT
PAD WRAPON POLAR SHDR XLG (MISCELLANEOUS) ×2 IMPLANT
PASSER SUT FIRSTPASS SELF (INSTRUMENTS) ×4 IMPLANT
SHAVER BLADE BONE CUTTER 4.5 (BLADE) ×3 IMPLANT
SHAVER BLADE TAPERED BLUNT 4 (BLADE) ×3 IMPLANT
SPONGE T-LAP 18X18 ~~LOC~~+RFID (SPONGE) ×2 IMPLANT
STRIP CLOSURE SKIN 1/2X4 (GAUZE/BANDAGES/DRESSINGS) ×3 IMPLANT
SUT ETHILON 4-0 (SUTURE) ×2
SUT ETHILON 4-0 FS2 18XMFL BLK (SUTURE) ×1
SUT MNCRL 4-0 (SUTURE) ×2
SUT MNCRL 4-0 27XMFL (SUTURE) ×1
SUT PDS AB 0 CT1 27 (SUTURE) ×6 IMPLANT
SUT PERFECTPASSER WHITE CART (SUTURE) ×12 IMPLANT
SUT SMART STITCH CARTRIDGE (SUTURE) ×13 IMPLANT
SUT VIC AB 0 CT1 36 (SUTURE) ×9 IMPLANT
SUT VIC AB 2-0 CT2 27 (SUTURE) ×3 IMPLANT
SUTURE ETHLN 4-0 FS2 18XMF BLK (SUTURE) ×1 IMPLANT
SUTURE MNCRL 4-0 27XMF (SUTURE) ×2 IMPLANT
SYR 10ML LL (SYRINGE) ×3 IMPLANT
TAPE MICROFOAM 4IN (TAPE) ×3 IMPLANT
TUBING CONNECTING 10 (TUBING) ×3 IMPLANT
TUBING INFLOW SET DBFLO PUMP (TUBING) ×3 IMPLANT
TUBING OUTFLOW SET DBLFO PUMP (TUBING) ×3 IMPLANT
WAND WEREWOLF FLOW 90D (MISCELLANEOUS) ×2 IMPLANT
WATER STERILE IRR 500ML POUR (IV SOLUTION) ×2 IMPLANT
WRAPON POLAR PAD SHDR XLG (MISCELLANEOUS) ×2

## 2021-07-25 NOTE — Discharge Instructions (Addendum)
AMBULATORY SURGERY  DISCHARGE INSTRUCTIONS   The drugs that you were given will stay in your system until tomorrow so for the next 24 hours you should not:  Drive an automobile Make any legal decisions Drink any alcoholic beverage   You may resume regular meals tomorrow.  Today it is better to start with liquids and gradually work up to solid foods.  You may eat anything you prefer, but it is better to start with liquids, then soup and crackers, and gradually work up to solid foods.   Please notify your doctor immediately if you have any unusual bleeding, trouble breathing, redness and pain at the surgery site, drainage, fever, or pain not relieved by medication.    Additional Instructions:  Keep green arm band on for 4 days May alternate tylenol '1000mg'$  every 8 hours (Max '4000mg'$  in 24 hours) and ibuprofen '800mg'$  every 8 hours (Max 24 hour dose)    Please contact your physician with any problems or Same Day Surgery at [phone], Monday through Friday 6 am to 4 pm, or [place] at [facility] number at [phone].    POLAR CARE INFORMATION  [email or web address]  How to use [facility] Cold Therapy System?  YouTube   [email or web address]  OPERATING INSTRUCTIONS  Start the product With dry hands, connect the transformer to the electrical connection located on the top of the cooler. Next, plug the transformer into an appropriate electrical outlet. The unit will automatically start running at this point.  To stop the pump, disconnect electrical power.  Unplug to stop the product when not in use. Unplugging the Polar Care unit turns it off. Always unplug immediately after use. Never leave it plugged in while unattended. Remove pad.    FIRST ADD WATER TO FILL LINE, THEN ICE---Replace ice when existing ice is almost melted  1 Discuss Treatment with your [place] Practitioner and Use Only as Prescribed 2 Apply Insulation Barrier &  Cold Therapy Pad 3 Check for Moisture 4 Inspect Skin Regularly  Tips and Trouble Shooting Usage Tips 1. Use cubed or chunked ice for optimal performance. 2. It is recommended to drain the Pad between uses. To drain the pad, hold the Pad upright with the hose pointed toward the ground. Depress the black plunger and allow water to drain out. 3. You may disconnect the Pad from the unit without removing the pad from the affected area by depressing the silver tabs on the hose coupling and gently pulling the hoses apart. The Pad and unit will seal itself and will not leak. Note: Some dripping during release is normal. 4. DO NOT RUN PUMP WITHOUT WATER! The pump in this unit is designed to run with water. Running the unit without water will cause permanent damage to the pump. 5. Unplug unit before removing lid.  TROUBLESHOOTING GUIDE Pump not running, Water not flowing to the pad, Pad is not getting cold 1. Make sure the transformer is plugged into the wall outlet. 2. Confirm that the ice and water are filled to the indicated levels. 3. Make sure there are no kinks in the pad. 4. Gently pull on the blue tube to make sure the tube/pad junction is straight. 5. Remove the pad from the treatment site and ll it while the pad is lying at; then reapply. 6. Confirm that the pad couplings are securely attached to the unit. Listen for the double clicks (Figure 1) to confirm the pad couplings are securely attached.  Leaks  Note: Some condensation on the lines, controller, and pads is unavoidable, especially in warmer climates. 1. If using a Breg Polar Care Cold Therapy unit with a detachable Cold Therapy Pad, and a leak exists (other than condensation on the lines) disconnect the pad couplings. Make sure the silver tabs on the couplings are depressed before reconnecting the pad to the pump hose; then confirm both sides of the coupling are properly clicked in. 2. If the coupling continues to leak or a leak is  detected in the pad itself, stop using it and call [facility] at [phone].  Cleaning After use, empty and dry the unit with a soft cloth. Warm water and mild detergent may be used occasionally to clean the pump and tubes.  WARNING: The [place] can be cold enough to cause serious injury, including full skin necrosis. Follow these Operating Instructions, and carefully read the Product Insert (see pouch on side of unit) and the Cold Therapy Pad Fitting Instructions (provided with each Cold Therapy Pad) prior to use.     Interscalene Nerve Block with Exparel   For your surgery you have received an Interscalene Nerve Block with Exparel. Nerve Blocks affect many types of nerves, including nerves that control movement, pain and normal sensation.  You may experience feelings such as numbness, tingling, heaviness, weakness or the inability to move your arm or the feeling or sensation that your arm has "fallen asleep". A nerve block with Exparel can last up to 5 days.  Usually the weakness wears off first.  The tingling and heaviness usually wear off next.  Finally you may start to notice pain.  Keep in mind that this may occur in any order.  Once a nerve block starts to wear off it is usually completely gone within 60 minutes. ISNB may cause mild shortness of breath, a hoarse voice, blurry vision, unequal pupils, or drooping of the face on the same side as the nerve block.  These symptoms will usually resolve with the numbness.  Very rarely the procedure itself can cause mild seizures. If needed, your surgeon will give you a prescription for pain medication.  It will take about 60 minutes for the oral pain medication to become fully effective.  So, it is recommended that you start taking this medication before the nerve block first begins to wear off, or when you first begin to feel discomfort. Take your pain medication only as prescribed.  Pain medication can cause sedation and decrease  your breathing if you take more than you need for the level of pain that you have. Nausea is a common side effect of many pain medications.  You may want to eat something before taking your pain medicine to prevent nausea. After an Interscalene nerve block, you cannot feel pain, pressure or extremes in temperature in the effected arm.  Because your arm is numb it is at an increased risk for injury.  To decrease the possibility of injury, please practice the following:  While you are awake change the position of your arm frequently to prevent too much pressure on any one area for prolonged periods of time.  If you have a cast or tight dressing, check the color or your fingers every couple of hours.  Call your surgeon with the appearance of any discoloration (white or blue). If you are given a sling to wear before you go home, please wear it  at all times until the block has completely worn off.  Do  not get up at night without your sling. Please contact [facility] or your surgeon if you do not begin to regain sensation after 7 days from the surgery.  Anesthesia may be contacted by calling the Same Day Surgery Department, Mon. through Fri., 6 am to 4 pm at [phone].   If you experience any other problems or concerns, please contact your surgeon's office. If you experience severe or prolonged shortness of breath go to the nearest emergency department.

## 2021-07-25 NOTE — Anesthesia Procedure Notes (Addendum)
Anesthesia Regional Block: Interscalene brachial plexus block   Pre-Anesthetic Checklist: , timeout performed,  Correct Patient, Correct Site, Correct Laterality,  Correct Procedure, Correct Position, site marked,  Risks and benefits discussed,  Surgical consent,  Pre-op evaluation,  At surgeon's request and post-op pain management  Laterality: Left and Upper  Prep: chloraprep       Needles:  Injection technique: Single-shot  Needle Type: Stimiplex     Needle Length: 9cm  Needle Gauge: 22     Additional Needles:   Procedures:,,,, ultrasound used (permanent image in chart),,    Narrative:  Start time: 07/25/2021 9:31 AM End time: 07/25/2021 9:34 AM Injection made incrementally with aspirations every 20 mL.  Performed by: Personally  Anesthesiologist: [name], MD  Additional Notes: Patient consented for risk and benefits of nerve block including but not limited to nerve damage, failed block, bleeding and infection.  Patient voiced understanding.  Functioning IV was confirmed and monitors were applied.  Timeout done prior to procedure and prior to any sedation being given to the patient.  Patient confirmed procedure site prior to any sedation given to the patient.  A 56m 22ga Stimuplex needle was used. Sterile prep,hand hygiene and sterile gloves were used.  Minimal sedation used for procedure.  No paresthesia endorsed by patient during the procedure.  Negative aspiration and negative test dose prior to incremental administration of local anesthetic. The patient tolerated the procedure well with no immediate complications.

## 2021-07-25 NOTE — Anesthesia Preprocedure Evaluation (Addendum)
Anesthesia Evaluation  Patient identified by MRN, date of birth, ID band Patient awake    Reviewed: Allergy & Precautions, H&P , NPO status , Patient's Chart, lab work & pertinent test results, reviewed documented beta blocker date and time   History of Anesthesia Complications Negative for: history of anesthetic complications  Airway Mallampati: III  TM Distance: >3 FB Neck ROM: full    Dental  (+) Dental Advidsory Given, Teeth Intact, Missing   Pulmonary neg shortness of breath, asthma (as a kid) , neg sleep apnea, neg recent URI, former smoker,    Pulmonary exam normal breath sounds clear to auscultation       Cardiovascular Exercise Tolerance: Good hypertension, (-) angina(-) Past MI and (-) Cardiac Stents Normal cardiovascular exam(-) dysrhythmias (-) Valvular Problems/Murmurs Rhythm:regular Rate:Normal     Neuro/Psych negative neurological ROS  negative psych ROS   GI/Hepatic negative GI ROS, H/o hep c s/p treatment   Endo/Other  diabetesPrediabetes  Renal/GU negative Renal ROS  negative genitourinary   Musculoskeletal   Abdominal (+) + obese,   Peds  Hematology negative hematology ROS (+)   Anesthesia Other Findings Past Medical History: No date: Allergy     Comment:  Seasonal No date: Hepatitis C No date: Hypertension No date: Prediabetes   Reproductive/Obstetrics negative OB ROS                            Anesthesia Physical  Anesthesia Plan  ASA: 2  Anesthesia Plan: General   Post-op Pain Management: Regional block* and Tylenol PO (pre-op)*   Induction: Intravenous  PONV Risk Score and Plan: 2 and Ondansetron, Dexamethasone, Midazolam and Treatment may vary due to age or medical condition  Airway Management Planned: Oral ETT  Additional Equipment:   Intra-op Plan:   Post-operative Plan: Extubation in OR  Informed Consent: I have reviewed the patients History  and Physical, chart, labs and discussed the procedure including the risks, benefits and alternatives for the proposed anesthesia with the patient or authorized representative who has indicated his/her understanding and acceptance.     Dental Advisory Given  Plan Discussed with: Anesthesiologist, CRNA and Surgeon  Anesthesia Plan Comments:        Anesthesia Quick Evaluation

## 2021-07-25 NOTE — Anesthesia Procedure Notes (Signed)
Anesthesia Regional Block: Interscalene brachial plexus block   Pre-Anesthetic Checklist: , timeout performed,  Correct Patient, Correct Site, Correct Laterality,  Correct Procedure, Correct Position, site marked,  Risks and benefits discussed,  Surgical consent,  Pre-op evaluation,  At surgeon's request and post-op pain management  Laterality: Upper  Prep: chloraprep       Needles:  Injection technique: Single-shot  Needle Type: Stimiplex     Needle Length: 9cm  Needle Gauge: 22     Additional Needles:   Procedures:,,,, ultrasound used (permanent image in chart),,    Narrative:  Injection made incrementally with aspirations every 5 mL.  Performed by: Personally  Anesthesiologist: Piscitello, Precious Haws, MD  Additional Notes: Patient consented for risk and benefits of nerve block including but not limited to nerve damage, failed block, bleeding and infection.  Patient voiced understanding.  Functioning IV was confirmed and monitors were applied.  Timeout done prior to procedure and prior to any sedation being given to the patient.  Patient confirmed procedure site prior to any sedation given to the patient.  A 29m 22ga Stimuplex needle was used. Sterile prep,hand hygiene and sterile gloves were used.  Minimal sedation used for procedure.  No paresthesia endorsed by patient during the procedure.  Negative aspiration and negative test dose prior to incremental administration of local anesthetic. The patient tolerated the procedure well with no immediate complications.

## 2021-07-25 NOTE — Op Note (Signed)
07/25/2021  1:49 PM  PATIENT:  Caleb Wall  65 y.o. male  PRE-OPERATIVE DIAGNOSIS:  Left Shoulder Rotator Cuff Tear  POST-OPERATIVE DIAGNOSIS: Left shoulder full-thickness tear of the supraspinatus, partial tear of the subscapularis, SLAP tear, complete tear of the long head of the biceps tendon with retraction out of the glenohumeral joint, os acromiale, subacromial impingement and acromioclavicular joint arthrosis  PROCEDURE:   Left shoulder arthroscopic labral debridement, debridement of the subscapularis tear, subacromial decompression and distal clavicle excision and mini open rotator cuff repair.  SURGEON:  Surgeon(s) and Role:    * Krasinski, Kevin, MD - Primary  ANESTHESIA:   general and paracervical block   PREOPERATIVE INDICATIONS:  Caleb Wall is a  65 y.o. male with a diagnosis of left full-thickness supraspinatus tear, confirmed by MRI, who failed nonoperative treatment and elected for surgical fixation of his rotator cuff tear.  Patient had significant limitation of motion and weakness to left shoulder shoulder forward elevation and abduction.  The risks benefits and alternatives were discussed with the patient preoperatively including but not limited to the risks of infection, bleeding, nerve injury, persistent pain or weakness, shoulder stiffness/arthrofibrosis, failure of the repair, re-tear of the rotator cuff and the need for further surgery. Medical risks include DVT and pulmonary embolism, myocardial infarction, stroke, pneumonia, respiratory failure and death. Patient understood these risks and wished to proceed.  OPERATIVE IMPLANTS: Smith & Nephew MultiFix anchors x 1 & Smith & Nephew Q Fix anchors x 1  OPERATIVE FINDINGS:  Full-thickness rotator cuff tear with partial detachment from the greater tuberosity footprint with a V-shaped component extending into the tendon Low-grade partial thickness tear of the subscapularis without retraction Complete tear of the  intra-articular portion of long head of the biceps tendon with retraction out of the glenohumeral joint Os acromiale Subacromial impingement Acromioclavicular joint arthrosis SLAP tear  OPERATIVE PROCEDURE: The patient was met in the preoperative area. The left shoulder was signed with the word yes and my initials according the hospital's correct site of surgery protocol.   A pre-op history and physical was performed at the bedside. Patient underwent an interscalene block with Exparel by the anesthesia service.  Patient was brought to the operating room where he underwent general anesthesia.  The patient was placed in a beachchair position.  A spider arm positioner was used for this case.  Examination under anesthesia revealed no loss of passive range of motion or instability with load shift testing. The patient had a negative sulcus sign.  Patient was prepped and draped in a sterile fashion. A timeout was performed to verify the patient's name, date of birth, medical record number, correct site of surgery and correct procedure to be performed there was also used to verify the patient received antibiotics that all appropriate instruments, implants and radiographs studies were available in the room. Once all in attendance were in agreement case began.  Patient received ancef 2 grams IV for pre-op antibiotics.  Bony landmarks were drawn out with a surgical marker along with proposed arthroscopy incisions.  An 11 blade was used to establish a posterior portal through which the arthroscope was placed in the glenohumeral joint. A full diagnostic examination of the shoulder was performed.  Findings are noted above.  The patient had fraying of the anterior without detachment and superior labrum fraying with detachment which was debrided with a Dyonics flyer shaver blade.  The undersurface of the subscapularis tear was also debrided using the Dyonics flyer shaver blade.    The subscapularis had a low-grade  partial-thickness tear without retraction which did not require surgical fixation.  There were no loose bodies or HAGL lesion seen in the inferior recess.  The arthroscope was then placed in the subacromial space.  Extensive bursitis was encountered and debrided using a 4-0 resector shaver blade and a 90 ArthroCare wand from a lateral portal which was established under direct visualization using an 18-gauge spinal needle.  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  A subacromial decompression of the anterior acromion was performed using a 4.5 mm Dyonics bone cutter shaver blade from the lateral portal.   A distal clavicle excision was then performed through the anterior portal also using the 4.5 mm bone cutter shaver blade.   A single perfect Pass sutures was placed in the lateral border of the rotator cuff tear. All arthroscopic instruments were then removed and the mini-open portion of the procedure began.  A saber-type incision was made along the lateral border of the acromion. The deltoid muscle was identified and split in line with its fibers which allowed visualization of the rotator cuff. The Perfect Pass suture previously placed in the lateral border of the rotator cuff was brought out through the deltoid split.  The rotator cuff was then visualized and found to have partial detachment from its greater tuberosity footprint as well as a significant V-shaped component extending into the supraspinatus tendon.  Three side-to-side sutures were placed using Smith & Nephew perfect pass sutures.  A single Smith and BorgWarner Fix anchors were placed at the articular margin of the humeral head with the greater tuberosity. A single suture limb of the Q Fix anchor were passed through the anterior and posterior portions of the rotator cuff tear using a First Pass suture passer.  The sutures were then tied using arthroscopic knot tying technique to perform a side-to-side repair over the greater tuberosity footprint.   Two Perfect Pass sutures, placed in the medial portion of the rotator cuff tear were then anchored to the greater tuberosity footprint using one Jessamine Multifix anchor and one Healicoil anchor. The sutures passed through these anchors were tensioned to reinforce the rotator cuff repair to the greater tuberosity footprint. Arthroscopic images of the repair were taken with the arthroscope both externally and from inside the glenohumeral joint.  All incisions were copiously irrigated. The deltoid fascia was repaired using a 0 Vicryl suture.  The subcutaneous tissue of all incisions were closed with a 2-0 Vicryl. Skin closure for the arthroscopic incisions was performed with 4-0 nylon. The skin edges of the saber incision was approximated with a running 4-0 undyed Monocryl.  A dry sterile dressing was applied.  The patient was placed in an abduction sling and a Polar Care was applied to the shoulder.  All sharp, sponge and it instrument counts were correct at the conclusion of the case. I was scrubbed and present for the entire case. I spoke with the patient's wife and daughter postoperatively to let them know the case had been performed without complication and the patient was stable in recovery room.

## 2021-07-25 NOTE — H&P (Signed)
PREOPERATIVE H&P  Chief Complaint: Left Shoulder Rotator Cuff Tear  HPI: Caleb Wall is a 66 y.o. male who presents for preoperative history and physical with a diagnosis of Left Shoulder Rotator Cuff Tear confirmed by MRI. Symptoms of left shoulder pain, weakness and limited range of motion are significantly impairing activities of daily living.  Patient failed nonoperative management and wished to proceed with left shoulder arthroscopy with mini open rotator cuff repair.  Past Medical History:  Diagnosis Date   Allergy    Seasonal   Diabetes mellitus without complication ([place])    Glaucoma    Hepatitis C    History of kidney stones    Hypertension    Prediabetes    Past Surgical History:  Procedure Laterality Date   ANTERIOR CERVICAL DECOMPRESSION/DISCECTOMY FUSION 4 LEVELS N/A 12/26/2020   Procedure: C3-6 ANTERIOR CERVICAL DECOMPRESSION/DISCECTOMY FUSION 3 LEVELS;  Surgeon: Deetta Perla, MD;  Location: [facility] ORS;  Service: Neurosurgery;  Laterality: N/A;   JOINT REPLACEMENT Left 02/2020   Total Hip replacement   KNEE SURGERY Left 2002   Meniscus tear   LITHOTRIPSY  12/13/2020   Social History   Socioeconomic History   Marital status: Divorced    Spouse name: Not on file   Number of children: Not on file   Years of education: Not on file   Highest education level: Not on file  Occupational History   Not on file  Tobacco Use   Smoking status: Former    Packs/day: 0.50    Years: 40.00    Pack years: 20.00    Types: Cigarettes    Quit date: 07/2019    Years since quitting: 2.0   Smokeless tobacco: Never  Vaping Use   Vaping Use: Never used  Substance and Sexual Activity   Alcohol use: No   Drug use: Not Currently    Types: Marijuana    Comment: History of cocaine use.    Sexual activity: Not on file  Other Topics Concern   Not on file  Social History Narrative   Not on file   Social Determinants of Health   Financial Resource Strain: Not on file  Food  Insecurity: Not on file  Transportation Needs: Not on file  Physical Activity: Not on file  Stress: Not on file  Social Connections: Not on file   Family History  Problem Relation Age of Onset   Heart disease Mother    Hypertension Mother    Diabetes Maternal Aunt    Cancer Maternal Uncle        Prostate   Diabetes Paternal Aunt    No Known Allergies Prior to Admission medications   Medication Sig Start Date End Date Taking? Authorizing Provider  brimonidine (ALPHAGAN) 0.2 % ophthalmic solution Place 1 drop into both eyes in the morning and at bedtime. 02/14/21 02/14/22 Yes [provider]  dorzolamide-timolol (COSOPT) 22.3-6.8 MG/ML ophthalmic solution Place 1 drop into both eyes 2 (two) times daily. 10/20/20  Yes [provider]  latanoprost (XALATAN) 0.005 % ophthalmic solution Place 1 drop into both eyes at bedtime. 04/13/21  Yes [provider]  lisinopril (ZESTRIL) 40 MG tablet Take 40 mg by mouth daily.   Yes [provider]  albuterol (VENTOLIN HFA) 108 (90 Base) MCG/ACT inhaler Inhale 1-2 puffs into the lungs every 6 (six) hours as needed for wheezing or shortness of breath. 05/28/21   Raspet, Derry Skill, PA-C  cetirizine (ZYRTEC) 10 MG tablet Take 1 tablet by mouth daily. 04/21/21  04/21/22  [provider]  meloxicam (MOBIC) 15 MG tablet Take 15 mg by mouth daily. Patient not taking: Reported on 07/20/2021    [provider]  nitrofurantoin, macrocrystal-monohydrate, (MACROBID) 100 MG capsule Take 1 capsule (100 mg total) by mouth 2 (two) times daily. Patient not taking: Reported on 07/20/2021 06/06/21   Chase Picket, MD  oxybutynin (DITROPAN) 5 MG tablet Take 5 mg by mouth 3 (three) times daily. Patient not taking: Reported on 07/20/2021 05/25/21   [provider]  senna-docusate (SENOKOT-S) 8.6-50 MG tablet Take 1 tablet by mouth at bedtime as needed for mild constipation. Patient not taking: Reported on 07/20/2021 12/27/20   Loleta Dicker, PA  tadalafil (CIALIS) 5 MG tablet Take 5 mg by mouth daily. Patient not taking: Reported on 07/20/2021    [provider]     Positive ROS: All other systems have been reviewed and were otherwise negative with the exception of those mentioned in the HPI and as above.  Physical Exam: General: Alert, no acute distress Cardiovascular: Regular rate and rhythm, no murmurs rubs or gallops.  No pedal edema Respiratory: Clear to auscultation bilaterally, no wheezes rales or rhonchi. No cyanosis, no use of accessory musculature GI: No organomegaly, abdomen is soft and non-tender nondistended with positive bowel sounds. Skin: Skin intact, no lesions within the operative field. Neurologic: Sensation intact distally Psychiatric: Patient is competent for consent with normal mood and affect Lymphatic: No cervical lymphadenopathy  MUSCULOSKELETAL: Left shoulder: Patient's left shoulder skin is intact.  There is no erythema or ecchymosis.  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  Patient can forward elevate and abduct his shoulder to only approximately 80 to 90 degrees before experiencing pain. He has pain with the downward directed force on his abducted shoulder and demonstrates weakness with shoulder abduction and external rotation. Patient has diminished sensation in his left hand, but no muscle atrophy is seen. He had positive impingement signs, but no apprehension or instability.   Radiology: Patient has a full thickness 8 x 6 mm tear involving the supraspinatus. He has a high grade partial tear of the subscapularis without full thickness tear or retraction. The radiologist report states that this undersurface tear involves approximately 50% of the tendon. Patient has a very thin intra-articular segment of the long head of the biceps which may indicate a tear. Patient had moderate AC joint  arthropathy. He has a downward sloping acromion. He has narrowing of the glenohumeral joint with inferior glenohumeral spurring. Patient has thickening of the capsule consistent with adhesive capsulitis. There is no evidence of fracture.  Assessment: Left Shoulder Rotator Cuff Tear  Plan: Plan for Procedure(s): Left shoulder arthroscopic subacromial decompression, possible distal clavicle excision with mini open rotator cuff repair and possible biceps tenotomy versus tenodesis  I reviewed the details of the operation as well as the postoperative course with the patient.  The left shoulder was marked according to hospital's correct site of surgery protocol.  A preop history and physical was performed at the bedside.  I discussed the risks and benefits of surgery. The risks include but are not limited to infection, bleeding, nerve or blood vessel injury, joint stiffness or loss of motion, persistent pain, weakness or instability, hardware failure and the need for further surgery.  Patient understood these risks and wished to proceed.     Thornton Park, MD  07/25/2021 9:29 AM

## 2021-07-25 NOTE — Transfer of Care (Signed)
Immediate Anesthesia Transfer of Care Note  Patient: Caleb Wall  Procedure(s) Performed: SHOULDER ARTHROSCOPY WITH OPEN ROTATOR CUFF REPAIR AND DISTAL CLAVICLE ACROMINECTOMY (Left)  Patient Location: PACU  Anesthesia Type:General  Level of Consciousness: awake, drowsy and patient cooperative  Airway & Oxygen Therapy: Patient Spontanous Breathing and Patient connected to face mask oxygen  Post-op Assessment: Report given to RN and Post -op Vital signs reviewed and stable  Post vital signs: Reviewed and stable  Last Vitals:  Vitals Value Taken Time  BP 154/86 07/25/21 1304  Temp    Pulse 70 07/25/21 1307  Resp 17 07/25/21 1307  SpO2 100 % 07/25/21 1307  Vitals shown include unvalidated device data.  Last Pain:  Vitals:   07/25/21 0837  TempSrc: Tympanic  PainSc: 3          Complications: No notable events documented.

## 2021-07-25 NOTE — Anesthesia Procedure Notes (Signed)
Procedure Name: Intubation Date/Time: 07/25/2021 10:09 AM Performed by: Kelton Pillar, CRNA Pre-anesthesia Checklist: Patient identified, Emergency Drugs available, Suction available and Patient being monitored Patient Re-evaluated:Patient Re-evaluated prior to induction Oxygen Delivery Method: Circle system utilized Preoxygenation: Pre-oxygenation with 100% oxygen Induction Type: IV induction Ventilation: Mask ventilation without difficulty Laryngoscope Size: McGraph and 3 Grade View: Grade I Tube type: Oral Tube size: 7.0 mm Number of attempts: 1 Airway Equipment and Method: Stylet and Oral airway Placement Confirmation: ETT inserted through vocal cords under direct vision, positive ETCO2, breath sounds checked- equal and bilateral and CO2 detector Secured at: 21 cm Tube secured with: Tape Dental Injury: Teeth and Oropharynx as per pre-operative assessment

## 2021-07-26 ENCOUNTER — Encounter: Payer: Self-pay | Admitting: Orthopedic Surgery

## 2021-07-27 NOTE — Anesthesia Postprocedure Evaluation (Signed)
Anesthesia Post Note  Patient: [name]  Procedure(s) Performed: SHOULDER ARTHROSCOPY WITH OPEN ROTATOR CUFF REPAIR AND DISTAL CLAVICLE ACROMINECTOMY (Left)  Patient location during evaluation: PACU Anesthesia Type: General Level of consciousness: awake and alert Pain management: pain level controlled Vital Signs Assessment: post-procedure vital signs reviewed and stable Respiratory status: spontaneous breathing, nonlabored ventilation and respiratory function stable Cardiovascular status: blood pressure returned to baseline and stable Postop Assessment: no apparent nausea or vomiting Anesthetic complications: no   No notable events documented.   Last Vitals:  Vitals:   07/25/21 1330 07/25/21 1351  BP: (!) 143/78   Pulse: 77 (P) 73  Resp: 19 (P) 16  Temp: 36.6 C (!) (P) 36.1 C  SpO2: 96% (P) 96%    Last Pain:  Vitals:   07/26/21 0852  TempSrc:   PainSc: 0-No pain                 [name]

## 2021-08-04 DIAGNOSIS — M5442 Lumbago with sciatica, left side: Secondary | ICD-10-CM | POA: Diagnosis not present

## 2021-08-04 DIAGNOSIS — G8929 Other chronic pain: Secondary | ICD-10-CM | POA: Diagnosis not present

## 2021-08-04 DIAGNOSIS — M461 Sacroiliitis, not elsewhere classified: Secondary | ICD-10-CM | POA: Diagnosis not present

## 2021-08-27 DIAGNOSIS — M654 Radial styloid tenosynovitis [de Quervain]: Secondary | ICD-10-CM | POA: Diagnosis not present

## 2021-09-01 DIAGNOSIS — M654 Radial styloid tenosynovitis [de Quervain]: Secondary | ICD-10-CM | POA: Diagnosis not present

## 2021-09-01 DIAGNOSIS — M25532 Pain in left wrist: Secondary | ICD-10-CM | POA: Diagnosis not present

## 2021-09-12 DIAGNOSIS — Z09 Encounter for follow-up examination after completed treatment for conditions other than malignant neoplasm: Secondary | ICD-10-CM | POA: Diagnosis not present

## 2021-09-12 DIAGNOSIS — Z981 Arthrodesis status: Secondary | ICD-10-CM | POA: Diagnosis not present

## 2021-09-29 ENCOUNTER — Encounter
Admission: RE | Disposition: A | Discharge status: Home / Self Care | Payer: Self-pay | Source: Home / Self Care | Attending: Gastroenterology

## 2021-09-29 ENCOUNTER — Ambulatory Visit
Admission: RE | Admit: 2021-09-29 | Discharge: 2021-09-29 | Disposition: A | Discharge status: Home / Self Care | Payer: 59 | Attending: Gastroenterology | Admitting: Gastroenterology

## 2021-09-29 ENCOUNTER — Ambulatory Visit: Payer: 59 | Admitting: General Practice

## 2021-09-29 ENCOUNTER — Encounter: Payer: Self-pay | Admitting: Gastroenterology

## 2021-09-29 ENCOUNTER — Other Ambulatory Visit: Payer: Self-pay

## 2021-09-29 DIAGNOSIS — Z1211 Encounter for screening for malignant neoplasm of colon: Secondary | ICD-10-CM | POA: Insufficient documentation

## 2021-09-29 DIAGNOSIS — D122 Benign neoplasm of ascending colon: Secondary | ICD-10-CM | POA: Insufficient documentation

## 2021-09-29 DIAGNOSIS — K640 First degree hemorrhoids: Secondary | ICD-10-CM | POA: Insufficient documentation

## 2021-09-29 HISTORY — PX: COLONOSCOPY WITH PROPOFOL: SHX5780

## 2021-09-29 LAB — GLUCOSE, CAPILLARY: Glucose-Capillary: 110 mg/dL — ABNORMAL HIGH (ref 70–99)

## 2021-09-29 SURGERY — COLONOSCOPY WITH PROPOFOL
Anesthesia: General

## 2021-09-29 MED ORDER — PROPOFOL 10 MG/ML IV BOLUS
INTRAVENOUS | Status: DC | PRN
  Administered 2021-09-29: 70 mg via INTRAVENOUS

## 2021-09-29 MED ORDER — LIDOCAINE HCL (CARDIAC) PF 100 MG/5ML IV SOSY
PREFILLED_SYRINGE | INTRAVENOUS | Status: DC | PRN
  Administered 2021-09-29: 50 mg via INTRAVENOUS

## 2021-09-29 MED ORDER — PROPOFOL 500 MG/50ML IV EMUL
INTRAVENOUS | Status: DC | PRN
  Administered 2021-09-29: 150 ug/kg/min via INTRAVENOUS

## 2021-09-29 MED ORDER — LIDOCAINE HCL (PF) 2 % IJ SOLN
INTRAMUSCULAR | Status: AC
  Filled 2021-09-29: qty 5

## 2021-09-29 MED ORDER — PROPOFOL 1000 MG/100ML IV EMUL
INTRAVENOUS | Status: AC
  Filled 2021-09-29: qty 100

## 2021-09-29 MED ORDER — SODIUM CHLORIDE 0.9 % IV SOLN: INTRAVENOUS | Status: DC

## 2021-09-29 NOTE — H&P (Signed)
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 Per notes, he might have had a colonoscopy 20 years ago. No blood thinners. No family history of GI malignancies. No significant abdominal surgeries.    Current Facility-Administered Medications:    0.9 %  sodium chloride infusion, , Intravenous, Continuous, Zimir Kittleson, Hilton Cork, MD, Last Rate: 20 mL/hr at 09/29/21 0815, New Bag at 09/29/21 0815  Medications Prior to Admission  Medication Sig Dispense Refill Last Dose   albuterol (VENTOLIN HFA) 108 (90 Base) MCG/ACT inhaler Inhale 1-2 puffs into the lungs every 6 (six) hours as needed for wheezing or shortness of breath. 18 g 1 Past Month   brimonidine (ALPHAGAN) 0.2 % ophthalmic solution Place 1 drop into both eyes in the morning and at bedtime.   09/28/2021   cetirizine (ZYRTEC) 10 MG tablet Take 1 tablet by mouth daily.   Past Month   dorzolamide-timolol (COSOPT) 22.3-6.8 MG/ML ophthalmic solution Place 1 drop into both eyes 2 (two) times daily.   09/28/2021   latanoprost (XALATAN) 0.005 % ophthalmic solution Place 1 drop into both eyes at bedtime.   09/28/2021   lisinopril (ZESTRIL) 40 MG tablet Take 40 mg by mouth daily.   09/28/2021   ondansetron (ZOFRAN) 4 MG tablet Take 1 tablet (4 mg total) by mouth every 8 (eight) hours as needed for nausea or vomiting. 20 tablet 0    oxybutynin (DITROPAN) 5 MG tablet Take 5 mg by mouth 3 (three) times daily. (Patient not taking: Reported on 07/20/2021)      oxyCODONE (OXY IR/ROXICODONE) 5 MG immediate release tablet Take 1-2 tablets (5-10 mg total) by mouth every 4 (four) hours as needed. 30 tablet 0    senna-docusate (SENOKOT-S) 8.6-50 MG tablet Take 1 tablet by mouth at bedtime as needed for mild  constipation. (Patient not taking: Reported on 07/20/2021) 30 tablet 0      No Known Allergies   Past Medical History:  Diagnosis Date   Allergy    Seasonal   Diabetes mellitus without complication (HCC)    Glaucoma    Hepatitis C    History of kidney stones    Hypertension    Prediabetes     Review of systems:  Otherwise negative.    Physical Exam  Gen: Alert, oriented. Appears stated age.  HEENT: PERRLA. Lungs: No respiratory distress CV: RRR Abd: soft, benign, no masses Ext: No edema    Planned procedures: Proceed with colonoscopy. The patient understands the nature of the planned procedure, indications, risks, alternatives and potential complications including but not limited to bleeding, infection, perforation, damage to internal organs and possible oversedation/side effects from anesthesia. The patient agrees and gives consent to proceed.  Please refer to procedure notes for findings, recommendations and patient disposition/instructions.     Lesly Rubenstein, MD [facility]

## 2021-09-29 NOTE — Transfer of Care (Signed)
Immediate Anesthesia Transfer of Care Note  Patient: Caleb Wall  Procedure(s) Performed: COLONOSCOPY WITH PROPOFOL  Patient Location: PACU  Anesthesia Type:General  Level of Consciousness: sedated  Airway & Oxygen Therapy: Patient Spontanous Breathing  Post-op Assessment: Report given to RN and Post -op Vital signs reviewed and stable  Post vital signs: Reviewed and stable  Last Vitals:  Vitals Value Taken Time  BP    Temp    Pulse    Resp    SpO2      Last Pain:  Vitals:   09/29/21 0803  TempSrc: Temporal  PainSc: 4          Complications: No notable events documented.

## 2021-09-29 NOTE — Interval H&P Note (Signed)
History and Physical Interval Note:  09/29/2021 8:30 AM  Caleb Wall  has presented today for surgery, with the diagnosis of Colon Cancer Screening.  The various methods of treatment have been discussed with the patient and family. After consideration of risks, benefits and other options for treatment, the patient has consented to  Procedure(s): COLONOSCOPY WITH PROPOFOL (N/A) as a surgical intervention.  The patient's history has been reviewed, patient examined, no change in status, stable for surgery.  I have reviewed the patient's chart and labs.  Questions were answered to the patient's satisfaction.     Lesly Rubenstein  Ok to proceed with colonoscopy

## 2021-09-29 NOTE — Op Note (Signed)
[facility] Patient Name: Caleb Wall Procedure Date: 09/29/2021 8:18 AM MRN: [id number] Account #: [id number] Date of Birth: Dec 19, 1955 Admit Type: Outpatient Age: 66 Room: [facility] ENDO ROOM 3 Gender: Male Note Status: Finalized Instrument Name: Park Meo [id number] Procedure:             Colonoscopy Indications:           Screening for colorectal malignant neoplasm Providers:             Andrey Farmer MD, MD Referring MD:          [facility] Medicines:             Monitored Anesthesia Care Complications:         No immediate complications. Estimated blood loss:                         Minimal. Procedure:             Pre-Anesthesia Assessment:                        - Prior to the procedure, a History and Physical was                         performed, and patient medications and allergies were                         reviewed. The patient is competent. The risks and                         benefits of the procedure and the sedation options and                         risks were discussed with the patient. All questions                         were answered and informed consent was obtained.                         Patient identification and proposed procedure were                         verified by the physician, the nurse, the                         anesthesiologist, the anesthetist and the technician                         in the endoscopy suite. Mental Status Examination:                         alert and oriented. Airway Examination: normal                         oropharyngeal airway and neck mobility. Respiratory                         Examination: clear to auscultation. CV Examination:  normal. Prophylactic Antibiotics: The patient does not                         require prophylactic antibiotics. Prior                         Anticoagulants: The patient has taken no previous                          anticoagulant or antiplatelet agents. ASA Grade                         Assessment: III - A patient with severe systemic                         disease. After reviewing the risks and benefits, the                         patient was deemed in satisfactory condition to                         undergo the procedure. The anesthesia plan was to use                         monitored anesthesia care (MAC). Immediately prior to                         administration of medications, the patient was                         re-assessed for adequacy to receive sedatives. The                         heart rate, respiratory rate, oxygen saturations,                         blood pressure, adequacy of pulmonary ventilation, and                         response to care were monitored throughout the                         procedure. The physical status of the patient was                         re-assessed after the procedure.                        After obtaining informed consent, the colonoscope was                         passed under direct vision. Throughout the procedure,                         the patient's blood pressure, pulse, and oxygen                         saturations were monitored continuously. The  Colonoscope was introduced through the anus and                         advanced to the the cecum, identified by appendiceal                         orifice and ileocecal valve. The colonoscopy was                         performed without difficulty. The patient tolerated                         the procedure well. The quality of the bowel                         preparation was adequate to identify polyps. Findings:      The perianal and digital rectal examinations were normal.      A 3 mm polyp was found in the ascending colon. The polyp was sessile.       The polyp was removed with a cold snare. Resection and retrieval were       complete. Estimated blood loss  was minimal.      Internal hemorrhoids were found during retroflexion. The hemorrhoids       were Grade I (internal hemorrhoids that do not prolapse).      The exam was otherwise without abnormality on direct and retroflexion       views. Impression:            - One 3 mm polyp in the ascending colon, removed with                         a cold snare. Resected and retrieved.                        - Internal hemorrhoids.                        - The examination was otherwise normal on direct and                         retroflexion views. Recommendation:        - Discharge patient to home.                        - Resume previous diet.                        - Continue present medications.                        - Await pathology results.                        - Repeat colonoscopy in 7 years for surveillance.                        - Return to referring physician as previously                         scheduled. Procedure Code(s):     ---  Professional ---                        [phone], Colonoscopy, flexible; with removal of                         tumor(s), polyp(s), or other lesion(s) by snare                         technique Diagnosis Code(s):     --- Professional ---                        Z12.11, Encounter for screening for malignant neoplasm                         of colon                        K63.5, Polyp of colon                        K64.0, First degree hemorrhoids CPT copyright 2019 American [employer] Association. All rights reserved. The codes documented in this report are preliminary and upon coder review may  be revised to meet current compliance requirements. Andrey Farmer MD, MD 09/29/2021 8:59:33 AM Number of Addenda: 0 Note Initiated On: 09/29/2021 8:18 AM Scope Withdrawal Time: 0 hours 12 minutes 25 seconds  Total Procedure Duration: 0 hours 17 minutes 27 seconds  Estimated Blood Loss:  Estimated blood loss was minimal.      [facility]

## 2021-09-29 NOTE — Anesthesia Postprocedure Evaluation (Signed)
Anesthesia Post Note  Patient: Caleb Wall  Procedure(s) Performed: COLONOSCOPY WITH PROPOFOL  Patient location during evaluation: Endoscopy Anesthesia Type: General Level of consciousness: awake and alert Pain management: pain level controlled Vital Signs Assessment: post-procedure vital signs reviewed and stable Respiratory status: spontaneous breathing, nonlabored ventilation, respiratory function stable and patient connected to nasal cannula oxygen Cardiovascular status: blood pressure returned to baseline and stable Postop Assessment: no apparent nausea or vomiting Anesthetic complications: no   No notable events documented.   Last Vitals:  Vitals:   09/29/21 0900 09/29/21 0912  BP: 96/60 127/84  Pulse: 77 87  Resp: 18 18  Temp: (!) 36.3 C (!) 36.4 C  SpO2: 97% 96%    Last Pain:  Vitals:   09/29/21 0912  TempSrc:   PainSc: 0-No pain                 Dimas Millin

## 2021-09-29 NOTE — Anesthesia Preprocedure Evaluation (Signed)
Anesthesia Evaluation  Patient identified by MRN, date of birth, ID band Patient awake    Reviewed: Allergy & Precautions, NPO status , Patient's Chart, lab work & pertinent test results  Airway Mallampati: IV  TM Distance: >3 FB Neck ROM: full    Dental  (+) Teeth Intact   Pulmonary asthma , former smoker,    Pulmonary exam normal        Cardiovascular hypertension, (-) angina(-) Past MI, (-) Cardiac Stents and (-) CABG negative cardio ROS Normal cardiovascular exam     Neuro/Psych negative neurological ROS  negative psych ROS   GI/Hepatic negative GI ROS, Neg liver ROS,   Endo/Other  negative endocrine ROSdiabetes  Renal/GU negative Renal ROS  negative genitourinary   Musculoskeletal   Abdominal   Peds  Hematology negative hematology ROS (+)   Anesthesia Other Findings Past Medical History: No date: Allergy     Comment:  Seasonal No date: Diabetes mellitus without complication (HCC) No date: Glaucoma No date: Hepatitis C No date: History of kidney stones No date: Hypertension No date: Prediabetes  Past Surgical History: 12/26/2020: ANTERIOR CERVICAL DECOMPRESSION/DISCECTOMY FUSION 4  LEVELS; N/A     Comment:  Procedure: C3-6 ANTERIOR CERVICAL               DECOMPRESSION/DISCECTOMY FUSION 3 LEVELS;  Surgeon: Deetta Perla, MD;  Location: [facility] ORS;  Service: Neurosurgery;               Laterality: N/A; 02/2020: JOINT REPLACEMENT; Left     Comment:  Total Hip replacement 2002: KNEE SURGERY; Left     Comment:  Meniscus tear 12/13/2020: LITHOTRIPSY 07/25/2021: SHOULDER ARTHROSCOPY WITH OPEN ROTATOR CUFF REPAIR AND  DISTAL CLAVICLE ACROMINECTOMY; Left     Comment:  Procedure: SHOULDER ARTHROSCOPY WITH OPEN ROTATOR CUFF               REPAIR AND DISTAL CLAVICLE ACROMINECTOMY;  Surgeon:               Thornton Park, MD;  Location: [facility] ORS;  Service:               Orthopedics;  Laterality:  Left;  BMI    Body Mass Index: 37.43 kg/m      Reproductive/Obstetrics negative OB ROS                             Anesthesia Physical Anesthesia Plan  ASA: 3  Anesthesia Plan: General   Post-op Pain Management: Minimal or no pain anticipated   Induction: Intravenous  PONV Risk Score and Plan: 3 and Propofol infusion, TIVA and Ondansetron  Airway Management Planned: Nasal Cannula  Additional Equipment: None  Intra-op Plan:   Post-operative Plan:   Informed Consent: I have reviewed the patients History and Physical, chart, labs and discussed the procedure including the risks, benefits and alternatives for the proposed anesthesia with the patient or authorized representative who has indicated his/her understanding and acceptance.     Dental advisory given  Plan Discussed with: CRNA and Surgeon  Anesthesia Plan Comments: (Discussed risks of anesthesia with patient, including possibility of difficulty with spontaneous ventilation under anesthesia necessitating airway intervention, PONV, and rare risks such as cardiac or respiratory or neurological events, and allergic reactions. Discussed the role of CRNA in patient's perioperative care. Patient understands.)        Anesthesia Quick Evaluation

## 2021-10-02 LAB — SURGICAL PATHOLOGY

## 2021-10-13 DIAGNOSIS — E119 Type 2 diabetes mellitus without complications: Secondary | ICD-10-CM | POA: Diagnosis not present

## 2021-10-13 DIAGNOSIS — D649 Anemia, unspecified: Secondary | ICD-10-CM | POA: Diagnosis not present

## 2021-10-13 DIAGNOSIS — Z981 Arthrodesis status: Secondary | ICD-10-CM | POA: Diagnosis not present

## 2021-10-13 DIAGNOSIS — Z6841 Body Mass Index (BMI) 40.0 and over, adult: Secondary | ICD-10-CM | POA: Diagnosis not present

## 2021-10-13 DIAGNOSIS — E7849 Other hyperlipidemia: Secondary | ICD-10-CM | POA: Diagnosis not present

## 2021-10-20 DIAGNOSIS — D649 Anemia, unspecified: Secondary | ICD-10-CM | POA: Diagnosis not present

## 2021-10-20 DIAGNOSIS — R300 Dysuria: Secondary | ICD-10-CM | POA: Diagnosis not present

## 2021-10-20 DIAGNOSIS — E119 Type 2 diabetes mellitus without complications: Secondary | ICD-10-CM | POA: Diagnosis not present

## 2021-10-20 DIAGNOSIS — E7849 Other hyperlipidemia: Secondary | ICD-10-CM | POA: Diagnosis not present

## 2021-10-20 DIAGNOSIS — Z6841 Body Mass Index (BMI) 40.0 and over, adult: Secondary | ICD-10-CM | POA: Diagnosis not present

## 2021-11-01 DIAGNOSIS — G8929 Other chronic pain: Secondary | ICD-10-CM | POA: Diagnosis not present

## 2021-11-01 DIAGNOSIS — M5442 Lumbago with sciatica, left side: Secondary | ICD-10-CM | POA: Diagnosis not present

## 2021-11-01 DIAGNOSIS — M461 Sacroiliitis, not elsewhere classified: Secondary | ICD-10-CM | POA: Diagnosis not present

## 2021-11-01 DIAGNOSIS — M5136 Other intervertebral disc degeneration, lumbar region: Secondary | ICD-10-CM | POA: Diagnosis not present

## 2021-11-01 DIAGNOSIS — M48062 Spinal stenosis, lumbar region with neurogenic claudication: Secondary | ICD-10-CM | POA: Diagnosis not present

## 2021-11-22 DIAGNOSIS — M5416 Radiculopathy, lumbar region: Secondary | ICD-10-CM | POA: Diagnosis not present

## 2021-11-22 DIAGNOSIS — M48062 Spinal stenosis, lumbar region with neurogenic claudication: Secondary | ICD-10-CM | POA: Diagnosis not present

## 2021-12-20 DIAGNOSIS — M5136 Other intervertebral disc degeneration, lumbar region: Secondary | ICD-10-CM | POA: Diagnosis not present

## 2021-12-20 DIAGNOSIS — M48062 Spinal stenosis, lumbar region with neurogenic claudication: Secondary | ICD-10-CM | POA: Diagnosis not present

## 2021-12-20 DIAGNOSIS — M5416 Radiculopathy, lumbar region: Secondary | ICD-10-CM | POA: Diagnosis not present

## 2022-01-18 DIAGNOSIS — R300 Dysuria: Secondary | ICD-10-CM | POA: Diagnosis not present

## 2022-01-18 DIAGNOSIS — Z6841 Body Mass Index (BMI) 40.0 and over, adult: Secondary | ICD-10-CM | POA: Diagnosis not present

## 2022-01-18 DIAGNOSIS — E7849 Other hyperlipidemia: Secondary | ICD-10-CM | POA: Diagnosis not present

## 2022-01-18 DIAGNOSIS — E119 Type 2 diabetes mellitus without complications: Secondary | ICD-10-CM | POA: Diagnosis not present

## 2022-01-18 DIAGNOSIS — D649 Anemia, unspecified: Secondary | ICD-10-CM | POA: Diagnosis not present

## 2022-01-22 DIAGNOSIS — R300 Dysuria: Secondary | ICD-10-CM | POA: Diagnosis not present

## 2022-01-22 DIAGNOSIS — I10 Essential (primary) hypertension: Secondary | ICD-10-CM | POA: Diagnosis not present

## 2022-01-22 DIAGNOSIS — E7849 Other hyperlipidemia: Secondary | ICD-10-CM | POA: Diagnosis not present

## 2022-01-22 DIAGNOSIS — Z6841 Body Mass Index (BMI) 40.0 and over, adult: Secondary | ICD-10-CM | POA: Diagnosis not present

## 2022-01-22 DIAGNOSIS — E119 Type 2 diabetes mellitus without complications: Secondary | ICD-10-CM | POA: Diagnosis not present

## 2022-01-23 ENCOUNTER — Other Ambulatory Visit: Payer: Self-pay | Admitting: Neurosurgery

## 2022-01-23 DIAGNOSIS — G959 Disease of spinal cord, unspecified: Secondary | ICD-10-CM | POA: Diagnosis not present

## 2022-01-23 DIAGNOSIS — Z981 Arthrodesis status: Secondary | ICD-10-CM | POA: Diagnosis not present

## 2022-01-23 DIAGNOSIS — M542 Cervicalgia: Secondary | ICD-10-CM | POA: Diagnosis not present

## 2022-01-26 ENCOUNTER — Ambulatory Visit: Admission: RE | Admit: 2022-01-26 | Payer: 59 | Source: Ambulatory Visit

## 2022-06-11 ENCOUNTER — Other Ambulatory Visit: Payer: Self-pay | Admitting: Infectious Diseases

## 2022-06-11 DIAGNOSIS — N201 Calculus of ureter: Secondary | ICD-10-CM

## 2022-06-11 DIAGNOSIS — N390 Urinary tract infection, site not specified: Secondary | ICD-10-CM

## 2022-06-11 DIAGNOSIS — N400 Enlarged prostate without lower urinary tract symptoms: Secondary | ICD-10-CM

## 2022-06-14 ENCOUNTER — Ambulatory Visit
Admission: RE | Admit: 2022-06-14 | Discharge: 2022-06-14 | Disposition: A | Discharge status: Home / Self Care | Payer: 59 | Source: Ambulatory Visit | Attending: Infectious Diseases | Admitting: Infectious Diseases

## 2022-06-14 DIAGNOSIS — N390 Urinary tract infection, site not specified: Secondary | ICD-10-CM

## 2022-06-14 DIAGNOSIS — N400 Enlarged prostate without lower urinary tract symptoms: Secondary | ICD-10-CM

## 2022-06-14 DIAGNOSIS — N201 Calculus of ureter: Secondary | ICD-10-CM

## 2022-06-25 ENCOUNTER — Other Ambulatory Visit: Payer: Self-pay

## 2022-06-25 ENCOUNTER — Encounter (HOSPITAL_COMMUNITY): Payer: Self-pay

## 2022-06-25 ENCOUNTER — Emergency Department (HOSPITAL_COMMUNITY)
Admission: EM | Admit: 2022-06-25 | Discharge: 2022-06-26 | Disposition: A | Discharge status: Home / Self Care | Payer: 59 | Attending: Emergency Medicine | Admitting: Emergency Medicine

## 2022-06-25 ENCOUNTER — Emergency Department (HOSPITAL_COMMUNITY): Payer: 59

## 2022-06-25 DIAGNOSIS — E119 Type 2 diabetes mellitus without complications: Secondary | ICD-10-CM | POA: Insufficient documentation

## 2022-06-25 DIAGNOSIS — R109 Unspecified abdominal pain: Secondary | ICD-10-CM | POA: Diagnosis present

## 2022-06-25 DIAGNOSIS — N12 Tubulo-interstitial nephritis, not specified as acute or chronic: Secondary | ICD-10-CM | POA: Insufficient documentation

## 2022-06-25 DIAGNOSIS — I10 Essential (primary) hypertension: Secondary | ICD-10-CM | POA: Insufficient documentation

## 2022-06-25 DIAGNOSIS — Z79899 Other long term (current) drug therapy: Secondary | ICD-10-CM | POA: Insufficient documentation

## 2022-06-25 LAB — CBC WITH DIFFERENTIAL/PLATELET
Abs Immature Granulocytes: 0.01 10*3/uL (ref 0.00–0.07)
Basophils Absolute: 0 10*3/uL (ref 0.0–0.1)
Basophils Relative: 1 %
Eosinophils Absolute: 0.2 10*3/uL (ref 0.0–0.5)
Eosinophils Relative: 3 %
HCT: 36.8 % — ABNORMAL LOW (ref 39.0–52.0)
Hemoglobin: 12.1 g/dL — ABNORMAL LOW (ref 13.0–17.0)
Immature Granulocytes: 0 %
Lymphocytes Relative: 35 %
Lymphs Abs: 2.6 10*3/uL (ref 0.7–4.0)
MCH: 28.3 pg (ref 26.0–34.0)
MCHC: 32.9 g/dL (ref 30.0–36.0)
MCV: 86 fL (ref 80.0–100.0)
Monocytes Absolute: 0.5 10*3/uL (ref 0.1–1.0)
Monocytes Relative: 7 %
Neutro Abs: 4 10*3/uL (ref 1.7–7.7)
Neutrophils Relative %: 54 %
Platelets: 196 10*3/uL (ref 150–400)
RBC: 4.28 MIL/uL (ref 4.22–5.81)
RDW: 14.1 % (ref 11.5–15.5)
WBC: 7.4 10*3/uL (ref 4.0–10.5)
nRBC: 0 % (ref 0.0–0.2)

## 2022-06-25 LAB — URINALYSIS, W/ REFLEX TO CULTURE (INFECTION SUSPECTED)
Bilirubin Urine: NEGATIVE
Glucose, UA: NEGATIVE mg/dL
Ketones, ur: NEGATIVE mg/dL
Nitrite: POSITIVE — AB
Protein, ur: 30 mg/dL — AB
RBC / HPF: >50 RBC/hpf (ref 0–5)
Specific Gravity, Urine: 1.02 (ref 1.005–1.030)
WBC, UA: >50 WBC/hpf (ref 0–5)
pH: 5 (ref 5.0–8.0)

## 2022-06-25 LAB — COMPREHENSIVE METABOLIC PANEL
ALT: 13 U/L (ref 0–44)
AST: 21 U/L (ref 15–41)
Albumin: 3.7 g/dL (ref 3.5–5.0)
Alkaline Phosphatase: 71 U/L (ref 38–126)
Anion gap: 10 (ref 5–15)
BUN: 18 mg/dL (ref 8–23)
CO2: 20 mmol/L — ABNORMAL LOW (ref 22–32)
Calcium: 9.3 mg/dL (ref 8.9–10.3)
Chloride: 107 mmol/L (ref 98–111)
Creatinine, Ser: 1.09 mg/dL (ref 0.61–1.24)
GFR, Estimated: >60 mL/min (ref >60)
Glucose, Bld: 129 mg/dL — ABNORMAL HIGH (ref 70–99)
Potassium: 3.7 mmol/L (ref 3.5–5.1)
Sodium: 137 mmol/L (ref 135–145)
Total Bilirubin: 0.5 mg/dL (ref 0.3–1.2)
Total Protein: 6.7 g/dL (ref 6.5–8.1)

## 2022-06-25 MED ORDER — SODIUM CHLORIDE 0.9 % IV SOLN
1.0000 g | Freq: Once | INTRAVENOUS | Status: AC
  Administered 2022-06-25: 1 g via INTRAVENOUS
  Filled 2022-06-25: qty 10

## 2022-06-25 MED ORDER — PHENAZOPYRIDINE HCL 100 MG PO TABS
95.0000 mg | ORAL_TABLET | Freq: Once | ORAL | Status: AC
  Administered 2022-06-25: 100 mg via ORAL
  Filled 2022-06-25: qty 1

## 2022-06-25 MED ORDER — KETOROLAC TROMETHAMINE 15 MG/ML IJ SOLN
15.0000 mg | Freq: Once | INTRAMUSCULAR | Status: AC
Start: 2022-06-25 — End: 2022-06-25
  Administered 2022-06-25: 15 mg via INTRAVENOUS
  Filled 2022-06-25: qty 1

## 2022-06-25 NOTE — ED Triage Notes (Addendum)
Pt arrives with c/o painful urination and left sided flank pain that started last week. Per pt, his urine does have an odor. Per pt, he feels like he is having difficulty urinating. Pt denies fevers. Pt endorses nausea.

## 2022-06-25 NOTE — ED Provider Notes (Addendum)
[place] EMERGENCY DEPARTMENT AT [facility] Provider Note   CSN: [id number] Arrival date & time: 06/25/22  1910     History  Chief Complaint  Patient presents with   Dysuria   Flank Pain    Caleb Wall is a 67 y.o. male with medical history of glaucoma, diabetes, appetite [place], kidney stones, hypertension, persistent UTI.  Patient presents to ED for evaluation of left-sided flank pain beginning on Wednesday, dysuria for the last 1 year.  Patient reports that he has had a persistent UTI for the last 1 year.  Patient reports he has been treated with antibiotics for the duration of this time.  Patient reports that he had a CT scan done last week which showed kidney stones and he was referred to urology he has an appointment on 5/15.  The patient states he is here because of worsening left-sided pain.  The patient denies any fevers, nausea vomiting, body aches or chills at home.  He is endorsing persistent dysuria as well as left-sided flank pain.  He states he is currently taking Macrobid.   Dysuria Presenting symptoms: dysuria   Associated symptoms: flank pain   Associated symptoms: no fever, no nausea and no vomiting   Flank Pain       Home Medications Prior to Admission medications   Medication Sig Start Date End Date Taking? Authorizing Provider  ciprofloxacin (CIPRO) 500 MG tablet Take 1 tablet (500 mg total) by mouth 2 (two) times daily. 06/26/22  Yes Al Decant, PA-C  HYDROcodone-acetaminophen (NORCO/VICODIN) 5-325 MG tablet Take 1 tablet by mouth every 6 (six) hours as needed. 06/26/22  Yes Al Decant, PA-C  albuterol (VENTOLIN HFA) 108 (90 Base) MCG/ACT inhaler Inhale 1-2 puffs into the lungs every 6 (six) hours as needed for wheezing or shortness of breath. 05/28/21   Raspet, Noberto Retort, PA-C  cetirizine (ZYRTEC) 10 MG tablet Take 1 tablet by mouth daily. 04/21/21 04/21/22  [provider]  dorzolamide-timolol (COSOPT) 22.3-6.8 MG/ML  ophthalmic solution Place 1 drop into both eyes 2 (two) times daily. 10/20/20   [provider]  latanoprost (XALATAN) 0.005 % ophthalmic solution Place 1 drop into both eyes at bedtime. 04/13/21   [provider]  lisinopril (ZESTRIL) 40 MG tablet Take 40 mg by mouth daily.    [provider]  ondansetron (ZOFRAN) 4 MG tablet Take 1 tablet (4 mg total) by mouth every 8 (eight) hours as needed for nausea or vomiting. 07/25/21   Juanell Fairly, MD  oxybutynin (DITROPAN) 5 MG tablet Take 5 mg by mouth 3 (three) times daily. Patient not taking: Reported on 07/20/2021 05/25/21   [provider]  senna-docusate (SENOKOT-S) 8.6-50 MG tablet Take 1 tablet by mouth at bedtime as needed for mild constipation. Patient not taking: Reported on 07/20/2021 12/27/20   Susanne Borders, PA      Allergies    Patient has no known allergies.    Review of Systems   Review of Systems  Constitutional:  Negative for fever.  Gastrointestinal:  Negative for nausea and vomiting.  Genitourinary:  Positive for dysuria and flank pain.  All other systems reviewed and are negative.   Physical Exam Updated Vital Signs BP 133/88   Pulse 80   Temp 98 F (36.7 C)   Resp 16   Wt 108.4 kg   SpO2 98%   BMI 37.43 kg/m  Physical Exam Vitals and nursing note reviewed.  Constitutional:      General:  He is not in acute distress.    Appearance: He is not ill-appearing, toxic-appearing or diaphoretic.  HENT:     Head: Normocephalic and atraumatic.     Nose: Nose normal.     Mouth/Throat:     Mouth: Mucous membranes are moist.     Pharynx: Oropharynx is clear.  Eyes:     Extraocular Movements: Extraocular movements intact.     Conjunctiva/sclera: Conjunctivae normal.     Pupils: Pupils are equal, round, and reactive to light.  Cardiovascular:     Rate and Rhythm: Normal rate and regular rhythm.  Pulmonary:     Effort: Pulmonary effort is normal.     Breath sounds: No wheezing.   Abdominal:     General: Abdomen is flat. Bowel sounds are normal.     Palpations: Abdomen is soft.     Tenderness: There is no abdominal tenderness. There is left CVA tenderness.  Musculoskeletal:     Cervical back: Normal range of motion and neck supple. No tenderness.  Skin:    General: Skin is warm and dry.     Capillary Refill: Capillary refill takes less than 2 seconds.  Neurological:     Mental Status: He is alert and oriented to person, place, and time.     ED Results / Procedures / Treatments   Labs (all labs ordered are listed, but only abnormal results are displayed) Labs Reviewed  CBC WITH DIFFERENTIAL/PLATELET - Abnormal; Notable for the following components:      Result Value   Hemoglobin 12.1 (*)    HCT 36.8 (*)    All other components within normal limits  COMPREHENSIVE METABOLIC PANEL - Abnormal; Notable for the following components:   CO2 20 (*)    Glucose, Bld 129 (*)    All other components within normal limits  URINALYSIS, W/ REFLEX TO CULTURE (INFECTION SUSPECTED) - Abnormal; Notable for the following components:   Color, Urine AMBER (*)    APPearance CLOUDY (*)    Hgb urine dipstick LARGE (*)    Protein, ur 30 (*)    Nitrite POSITIVE (*)    Leukocytes,Ua MODERATE (*)    Bacteria, UA MANY (*)    All other components within normal limits  URINE CULTURE    EKG None  Radiology CT Renal Stone Study  Result Date: 06/25/2022 CLINICAL DATA:  Abdominal and flank pain EXAM: CT ABDOMEN AND PELVIS WITHOUT CONTRAST TECHNIQUE: Multidetector CT imaging of the abdomen and pelvis was performed following the standard protocol without IV contrast. RADIATION DOSE REDUCTION: This exam was performed according to the departmental dose-optimization program which includes automated exposure control, adjustment of the mA and/or kV according to patient size and/or use of iterative reconstruction technique. COMPARISON:  CT abdomen and pelvis 06/14/2022 FINDINGS: Lower chest:  No acute abnormality. Hepatobiliary: No focal liver abnormality is seen. No gallstones, gallbladder wall thickening, or biliary dilatation. Pancreas: Unremarkable. No pancreatic ductal dilatation or surrounding inflammatory changes. Spleen: Normal in size without focal abnormality. Adrenals/Urinary Tract: There are bilateral nonobstructing renal calculi, left greater than right. The largest is in the superior pole the left kidney measuring 11 mm. These are similar to prior. There is no hydronephrosis or perinephric fat stranding. The adrenal glands and bladder are within normal limits. Stomach/Bowel: Stomach is within normal limits. Appendix appears normal. No evidence of bowel wall thickening, distention, or inflammatory changes. There are scattered colonic diverticula. Vascular/Lymphatic: Aortic atherosclerosis. No enlarged abdominal or pelvic lymph nodes. Reproductive: Prostate is unremarkable. Other: No  abdominal wall hernia or abnormality. No abdominopelvic ascites. Musculoskeletal: Degenerative changes affect the spine. Left hip arthroplasty is present. IMPRESSION: 1. Nonobstructing bilateral renal calculi. 2. Colonic diverticulosis without evidence for diverticulitis. Aortic Atherosclerosis (ICD10-I70.0). Electronically Signed   By: Darliss Cheney M.D.   On: 06/25/2022 20:52    Procedures Procedures   Medications Ordered in ED Medications  ketorolac (TORADOL) 15 MG/ML injection 15 mg (15 mg Intravenous Given 06/25/22 2326)  phenazopyridine (PYRIDIUM) tablet 100 mg (100 mg Oral Given 06/25/22 2326)  cefTRIAXone (ROCEPHIN) 1 g in sodium chloride 0.9 % 100 mL IVPB (0 g Intravenous Stopped 06/25/22 2336)    ED Course/ Medical Decision Making/ A&P     Medical Decision Making Amount and/or Complexity of Data Reviewed Labs: ordered.  Risk Prescription drug management.   67 year old male presents to the ED for evaluation.  Please see HPI for further details.  On my examination the patient is  afebrile and nontachycardic.  His lung sounds are clear bilaterally, he is not hypoxic.  His abdomen is soft and compressible with associated left-sided CVA tenderness.  Nontoxic in appearance.  CBC without leukocytosis, no anemia.  CMP with no elevated creatinine, no electrolyte derangement.  Urinalysis shows large hemoglobin, nitrite positive urine, moderate leukocytes.  We will culture the patient urine.  The patient also has left-sided flank pain so will assume pyelonephritis in this setting.  The patient is currently being treated on Keflex, he is also been treated on Macrobid in the past.  Recent urine culture shows patient sensitivite to ciprofloxacin so we will transfer patient antibiotics to this. Prior to discharge, we will give the patient 1 g Rocephin through an IV as well as Pyridium and Toradol for pain.  Patient CT renal stone study shows nonobstructive bilateral renal calculi similar to the study conducted on 06/14/2022.  The patient will be sent home with ciprofloxacin which he will take twice daily for 7 days.  The patient will also be advised to take Pyridium for burning with urination.  Will also send the patient home with hydrocodone for pain.  Patient reports he has follow-up appointment with urology on 5/15, I have advised him to keep this appointment.  The patient was advised to return to the ED with any new or worsening signs or symptoms such as nausea, vomiting or fevers.  He voiced understanding.  Return precautions provided and he voiced understanding.  All questions answered to his satisfaction.  Stable to discharge home at this time.  Case discussed with attending Dr. Dalene Seltzer who voices agreement with plan of management.   Final Clinical Impression(s) / ED Diagnoses Final diagnoses:  Pyelonephritis    Rx / DC Orders ED Discharge Orders          Ordered    ciprofloxacin (CIPRO) 500 MG tablet  2 times daily        06/26/22 0040    HYDROcodone-acetaminophen  (NORCO/VICODIN) 5-325 MG tablet  Every 6 hours PRN        06/26/22 0040                Al Decant, PA-C 06/26/22 0044    Alvira Monday, MD 06/26/22 [phone]

## 2022-06-25 NOTE — ED Provider Notes (Incomplete)
[place] EMERGENCY DEPARTMENT AT [facility] Provider Note   CSN: [id number] Arrival date & time: 06/25/22  1910     History {Add pertinent medical, surgical, social history, OB history to HPI:1} Chief Complaint  Patient presents with  . Dysuria  . Flank Pain    Caleb Wall is a 67 y.o. male with medical history of***.  Patient presents to ED for evaluation of left-sided flank pain beginning on Wednesday, dysuria for the last 1 year.  Patient reports that he has had a persistent UTI for the last 1 year.  Patient reports he has been treated with antibiotics for the duration of this time.'s.  Patient reports that he had a CT scan done last week which showed kidney stones and he was referred to urology he has an appointment on 5/15.  The patient   Dysuria Presenting symptoms: dysuria   Associated symptoms: flank pain   Flank Pain       Home Medications Prior to Admission medications   Medication Sig Start Date End Date Taking? Authorizing Provider  albuterol (VENTOLIN HFA) 108 (90 Base) MCG/ACT inhaler Inhale 1-2 puffs into the lungs every 6 (six) hours as needed for wheezing or shortness of breath. 05/28/21   Raspet, Noberto Retort, PA-C  cetirizine (ZYRTEC) 10 MG tablet Take 1 tablet by mouth daily. 04/21/21 04/21/22  [provider]  dorzolamide-timolol (COSOPT) 22.3-6.8 MG/ML ophthalmic solution Place 1 drop into both eyes 2 (two) times daily. 10/20/20   [provider]  latanoprost (XALATAN) 0.005 % ophthalmic solution Place 1 drop into both eyes at bedtime. 04/13/21   [provider]  lisinopril (ZESTRIL) 40 MG tablet Take 40 mg by mouth daily.    [provider]  ondansetron (ZOFRAN) 4 MG tablet Take 1 tablet (4 mg total) by mouth every 8 (eight) hours as needed for nausea or vomiting. 07/25/21   Juanell Fairly, MD  oxybutynin (DITROPAN) 5 MG tablet Take 5 mg by mouth 3 (three) times daily. Patient not taking: Reported on 07/20/2021 05/25/21    [provider]  oxyCODONE (OXY IR/ROXICODONE) 5 MG immediate release tablet Take 1-2 tablets (5-10 mg total) by mouth every 4 (four) hours as needed. 07/25/21   Juanell Fairly, MD  senna-docusate (SENOKOT-S) 8.6-50 MG tablet Take 1 tablet by mouth at bedtime as needed for mild constipation. Patient not taking: Reported on 07/20/2021 12/27/20   Susanne Borders, PA      Allergies    Patient has no known allergies.    Review of Systems   Review of Systems  Genitourinary:  Positive for dysuria and flank pain.    Physical Exam Updated Vital Signs BP 121/76 (BP Location: Right Arm)   Pulse 90   Temp 98.5 F (36.9 C)   Resp 18   Wt 108.4 kg   SpO2 98%   BMI 37.43 kg/m  Physical Exam  ED Results / Procedures / Treatments   Labs (all labs ordered are listed, but only abnormal results are displayed) Labs Reviewed  CBC WITH DIFFERENTIAL/PLATELET - Abnormal; Notable for the following components:      Result Value   Hemoglobin 12.1 (*)    HCT 36.8 (*)    All other components within normal limits  COMPREHENSIVE METABOLIC PANEL - Abnormal; Notable for the following components:   CO2 20 (*)    Glucose, Bld 129 (*)    All other components within normal limits  URINALYSIS, W/ REFLEX TO CULTURE (INFECTION SUSPECTED) - Abnormal; Notable  for the following components:   Color, Urine AMBER (*)    APPearance CLOUDY (*)    Hgb urine dipstick LARGE (*)    Protein, ur 30 (*)    Nitrite POSITIVE (*)    Leukocytes,Ua MODERATE (*)    Bacteria, UA MANY (*)    All other components within normal limits  URINE CULTURE    EKG None  Radiology CT Renal Stone Study  Result Date: 06/25/2022 CLINICAL DATA:  Abdominal and flank pain EXAM: CT ABDOMEN AND PELVIS WITHOUT CONTRAST TECHNIQUE: Multidetector CT imaging of the abdomen and pelvis was performed following the standard protocol without IV contrast. RADIATION DOSE REDUCTION: This exam was performed according to the departmental  dose-optimization program which includes automated exposure control, adjustment of the mA and/or kV according to patient size and/or use of iterative reconstruction technique. COMPARISON:  CT abdomen and pelvis 06/14/2022 FINDINGS: Lower chest: No acute abnormality. Hepatobiliary: No focal liver abnormality is seen. No gallstones, gallbladder wall thickening, or biliary dilatation. Pancreas: Unremarkable. No pancreatic ductal dilatation or surrounding inflammatory changes. Spleen: Normal in size without focal abnormality. Adrenals/Urinary Tract: There are bilateral nonobstructing renal calculi, left greater than right. The largest is in the superior pole the left kidney measuring 11 mm. These are similar to prior. There is no hydronephrosis or perinephric fat stranding. The adrenal glands and bladder are within normal limits. Stomach/Bowel: Stomach is within normal limits. Appendix appears normal. No evidence of bowel wall thickening, distention, or inflammatory changes. There are scattered colonic diverticula. Vascular/Lymphatic: Aortic atherosclerosis. No enlarged abdominal or pelvic lymph nodes. Reproductive: Prostate is unremarkable. Other: No abdominal wall hernia or abnormality. No abdominopelvic ascites. Musculoskeletal: Degenerative changes affect the spine. Left hip arthroplasty is present. IMPRESSION: 1. Nonobstructing bilateral renal calculi. 2. Colonic diverticulosis without evidence for diverticulitis. Aortic Atherosclerosis (ICD10-I70.0). Electronically Signed   By: Darliss Cheney M.D.   On: 06/25/2022 20:52    Procedures Procedures  {Document cardiac monitor, telemetry assessment procedure when appropriate:1}  Medications Ordered in ED Medications - No data to display  ED Course/ Medical Decision Making/ A&P   {   Click here for ABCD2, HEART and other calculatorsREFRESH Note before signing :1}                          Medical Decision Making Amount and/or Complexity of Data  Reviewed Labs: ordered.   ***  {Document critical care time when appropriate:1} {Document review of labs and clinical decision tools ie heart score, Chads2Vasc2 etc:1}  {Document your independent review of radiology images, and any outside records:1} {Document your discussion with family members, caretakers, and with consultants:1} {Document social determinants of health affecting pt's care:1} {Document your decision making why or why not admission, treatments were needed:1} Final Clinical Impression(s) / ED Diagnoses Final diagnoses:  None    Rx / DC Orders ED Discharge Orders     None

## 2022-06-25 NOTE — ED Provider Triage Note (Signed)
Emergency Medicine Provider Triage Evaluation Note  Caleb Wall , a 67 y.o. male  was evaluated in triage.  Patient presented today with concern for left flank pain.  Endorses some urinary hesitancy as well.  History of sepsis secondary to nephrolithiasis.  Review of Systems  Positive:  Negative:   Physical Exam  BP 121/76 (BP Location: Right Arm)   Pulse 90   Temp 98.5 F (36.9 C)   Resp 18   Wt 108.4 kg   SpO2 98%   BMI 37.43 kg/m  Gen:   Awake, no distress   Resp:  Normal effort  MSK:   Moves extremities without difficulty  Other:    Medical Decision Making  Medically screening exam initiated at 7:42 PM.  Appropriate orders placed.  Caleb Wall was informed that the remainder of the evaluation will be completed by another provider, this initial triage assessment does not replace that evaluation, and the importance of remaining in the ED until their evaluation is complete.     Caleb Wall, [place] 06/25/22 1943

## 2022-06-26 MED ORDER — CIPROFLOXACIN HCL 500 MG PO TABS: 500.0000 mg | ORAL_TABLET | Freq: Two times a day (BID) | ORAL | 0 refills | Status: DC

## 2022-06-26 MED ORDER — HYDROCODONE-ACETAMINOPHEN 5-325 MG PO TABS: 1.0000 | ORAL_TABLET | Freq: Four times a day (QID) | ORAL | 0 refills | Status: DC | PRN

## 2022-06-26 NOTE — Discharge Instructions (Signed)
Please return to the ED with any new symptoms such as fevers, nausea, vomiting Please follow-up with urology on 5/15 as we discussed Please begin taking ciprofloxacin.  You will take this medication twice daily for 7 days.  You may also take Pyridium otherwise known as Azo for burning with urination.  For your left-sided flank pain, please take hydrocodone which I prescribed you.  Please do not drive or operate heavy machinery under the influence of this medication Please read the attached guide concerning pyelonephritis Please continue pushing fluids and remaining hydrated

## 2022-06-26 NOTE — ED Notes (Signed)
Pt provided with AVS.  Education complete; all questions answered.  Pt leaving ED in stable condition at this time via wheelchair with all belongings. 

## 2022-06-27 LAB — URINE CULTURE: Culture: >=100000 — AB

## 2022-06-29 ENCOUNTER — Ambulatory Visit: Payer: Self-pay | Admitting: Urology

## 2022-07-04 ENCOUNTER — Ambulatory Visit: Payer: 59 | Admitting: Urology

## 2022-07-04 ENCOUNTER — Encounter: Payer: Self-pay | Admitting: Urology

## 2022-07-04 VITALS — BP 140/86 | HR 79 | Ht 67.0 in | Wt 236.0 lb

## 2022-07-04 DIAGNOSIS — G959 Disease of spinal cord, unspecified: Secondary | ICD-10-CM

## 2022-07-04 DIAGNOSIS — N390 Urinary tract infection, site not specified: Secondary | ICD-10-CM | POA: Diagnosis not present

## 2022-07-04 DIAGNOSIS — R339 Retention of urine, unspecified: Secondary | ICD-10-CM

## 2022-07-04 DIAGNOSIS — N201 Calculus of ureter: Secondary | ICD-10-CM | POA: Diagnosis not present

## 2022-07-04 DIAGNOSIS — N401 Enlarged prostate with lower urinary tract symptoms: Secondary | ICD-10-CM

## 2022-07-04 DIAGNOSIS — N200 Calculus of kidney: Secondary | ICD-10-CM

## 2022-07-04 LAB — URINALYSIS, COMPLETE
Bilirubin, UA: NEGATIVE
Glucose, UA: NEGATIVE
Ketones, UA: NEGATIVE
Nitrite, UA: NEGATIVE
Protein,UA: NEGATIVE
Specific Gravity, UA: 1.015 (ref 1.005–1.030)
Urobilinogen, Ur: 0.2 mg/dL (ref 0.2–1.0)
pH, UA: 5.5 (ref 5.0–7.5)

## 2022-07-04 LAB — MICROSCOPIC EXAMINATION: RBC, Urine: >30 /hpf — AB (ref 0–2)

## 2022-07-04 MED ORDER — SULFAMETHOXAZOLE-TRIMETHOPRIM 800-160 MG PO TABS: 1.0000 | ORAL_TABLET | Freq: Two times a day (BID) | ORAL | 0 refills | Status: AC

## 2022-07-04 MED ORDER — TAMSULOSIN HCL 0.4 MG PO CAPS: 0.4000 mg | ORAL_CAPSULE | Freq: Every day | ORAL | 0 refills | Status: DC

## 2022-07-04 MED ORDER — ALFUZOSIN HCL ER 10 MG PO TB24: 10.0000 mg | ORAL_TABLET | Freq: Every day | ORAL | 3 refills | Status: DC

## 2022-07-04 NOTE — Progress Notes (Signed)
In and Out Catheterization  Patient is present today for a I & O catheterization due to recurrent UTI. Patient was cleaned and prepped in a sterile fashion with betadine . A 14FR cath was inserted no complications were noted , of urine return was noted, urine was yellow in color. A clean urine sample was collected for UA. Bladder was drained  And catheter was removed with out difficulty.    Performed by: Teressa Lower, CMA  Follow up/ Additional notes: 5 weeks

## 2022-07-04 NOTE — Progress Notes (Signed)
Albin Fischer Abdulla,acting as a scribe for Riki Altes, MD., have documented all relevant documentation on the behalf of Riki Altes, MD, as directed by  Riki Altes, MD while in the presence of Riki Altes, MD.  07/04/2022 3:36 PM   Caleb Wall June 13, 1955 [id number]  Referring provider: [facility] [address] [place],  [place] [address]  Chief Complaint  Patient presents with   Recurrent UTI    HPI: Caleb Wall is a 67 y.o. male referred for evaluation of recurrent UTI and nephrolithiasis.  Long history of recurrent stone disease with prior history of multiple ureteroscopic stone removals/stent placement at [facility]. Prior history of sepsis from an obstructing ureteral calculus. Over the past year, he has been treated for recurrent urinary tract infections. Symptoms typically present as dysuria and left low back pain. He has been treated with multiple courses of antibiotic, however, states they are usually 7-10 days in duration. He was treated with a one month course of Macrobid by Dr. Sampson Goon recently, but presented to the ED at [facility] on 06/25/2022, complaining of dysuria and left back pain. Urine grew E. coli, which was resistant to nitrofurantoin, and he was treated with a one week course of Cipro, which he finished yesterday. He does have baseline frequency, urgency, hesitancy, and a weak urinary stream. He was unable to provide a urine specimen this afternoon and was catheterized with a return of 350 mL of urine. CT performed last week showed bilateral, non-obstructing renal calculi.   PMH: Past Medical History:  Diagnosis Date   Allergy    Seasonal   Diabetes mellitus without complication (HCC)    Glaucoma    Hepatitis C    History of kidney stones    Hypertension    Prediabetes     Surgical History: Past Surgical History:  Procedure Laterality Date   ANTERIOR CERVICAL DECOMPRESSION/DISCECTOMY FUSION 4 LEVELS N/A 12/26/2020    Procedure: C3-6 ANTERIOR CERVICAL DECOMPRESSION/DISCECTOMY FUSION 3 LEVELS;  Surgeon: Lucy Chris, MD;  Location: [facility] ORS;  Service: Neurosurgery;  Laterality: N/A;   COLONOSCOPY WITH PROPOFOL N/A 09/29/2021   Procedure: COLONOSCOPY WITH PROPOFOL;  Surgeon: Regis Bill, MD;  Location: [facility] ENDOSCOPY;  Service: Endoscopy;  Laterality: N/A;   JOINT REPLACEMENT Left 02/2020   Total Hip replacement   KNEE SURGERY Left 2002   Meniscus tear   LITHOTRIPSY  12/13/2020   SHOULDER ARTHROSCOPY WITH OPEN ROTATOR CUFF REPAIR AND DISTAL CLAVICLE ACROMINECTOMY Left 07/25/2021   Procedure: SHOULDER ARTHROSCOPY WITH OPEN ROTATOR CUFF REPAIR AND DISTAL CLAVICLE ACROMINECTOMY;  Surgeon: Juanell Fairly, MD;  Location: [facility] ORS;  Service: Orthopedics;  Laterality: Left;    Home Medications:  Allergies as of 07/04/2022   No Known Allergies      Medication List        Accurate as of Jul 04, 2022  3:36 PM. If you have any questions, ask your nurse or doctor.          STOP taking these medications    ondansetron 4 MG tablet Commonly known as: Zofran Stopped by: Riki Altes, MD   oxybutynin 5 MG tablet Commonly known as: DITROPAN Stopped by: Riki Altes, MD   senna-docusate 8.6-50 MG tablet Commonly known as: Senokot-S Stopped by: Riki Altes, MD   tamsulosin 0.4 MG Caps capsule Commonly known as: FLOMAX Stopped by: Riki Altes, MD       TAKE these medications    albuterol 108 (  90 Base) MCG/ACT inhaler Commonly known as: VENTOLIN HFA Inhale 1-2 puffs into the lungs every 6 (six) hours as needed for wheezing or shortness of breath.   alfuzosin 10 MG 24 hr tablet Commonly known as: UROXATRAL Take 1 tablet (10 mg total) by mouth daily with breakfast. Started by: Riki Altes, MD   cetirizine 10 MG tablet Commonly known as: ZYRTEC Take 1 tablet by mouth daily.   ciprofloxacin 500 MG tablet Commonly known as: CIPRO Take 1 tablet (500 mg total) by mouth 2  (two) times daily.   dorzolamide-timolol 2-0.5 % ophthalmic solution Commonly known as: COSOPT Place 1 drop into both eyes 2 (two) times daily.   HYDROcodone-acetaminophen 5-325 MG tablet Commonly known as: NORCO/VICODIN Take 1 tablet by mouth every 6 (six) hours as needed.   latanoprost 0.005 % ophthalmic solution Commonly known as: XALATAN Place 1 drop into both eyes at bedtime.   lisinopril 40 MG tablet Commonly known as: ZESTRIL Take 40 mg by mouth daily.   sulfamethoxazole-trimethoprim 800-160 MG tablet Commonly known as: BACTRIM DS Take 1 tablet by mouth 2 (two) times daily for 21 days. Started by: Riki Altes, MD        Family History: Family History  Problem Relation Age of Onset   Heart disease Mother    Hypertension Mother    Diabetes Maternal Aunt    Cancer Maternal Uncle        Prostate   Diabetes Paternal Aunt     Social History:  reports that he quit smoking about 2 years ago. His smoking use included cigarettes. He has a 20.00 pack-year smoking history. He has never used smokeless tobacco. He reports that he does not currently use drugs after having used the following drugs: Marijuana. He reports that he does not drink alcohol.   Physical Exam: BP (!) 140/86   Pulse 79   Ht 5\' 7"  (1.702 m)   Wt 236 lb (107 kg)   BMI 36.96 kg/m   Constitutional:  Alert and oriented, No acute distress. HEENT: [place] AT Respiratory: Normal respiratory effort, no increased work of breathing. Psychiatric: Normal mood and affect.   Assessment & Plan:    1. Recurrent UTI We discussed the most common etiology in men is chronic bacterial prostatitis. He has had an inadequate antibiotic duration for presumed prostate infection. Recently treated with a one month course of nitrofuantoin. However, nitrofurantoin does not achieve prostate tissue levels and the bacteria was resistant to nitrofurantoin. He has completed a one week course of Cipro and will start a 3 week  course of Septra DS twice daily to achieve a 4 week course. PA follow up in 4 weeks for symptom check and repeat UA.  2. BPH with incomplete bladder emptying Start Tamsulosin 0.4 mg daily.  3. Bilateral Nephrolithiasis Non-obstructing bilateral renal calculi. KUB ordered. His infections have been E. coli, which are typically not associated with stones.   I have reviewed the above documentation for accuracy and completeness, and I agree with the above.   Riki Altes, MD  [facility] [address], [address] [place], [place] [address] [phone]

## 2022-07-10 ENCOUNTER — Ambulatory Visit
Admission: RE | Admit: 2022-07-10 | Discharge: 2022-07-10 | Disposition: A | Discharge status: Home / Self Care | Payer: 59 | Source: Ambulatory Visit | Attending: Urology | Admitting: Urology

## 2022-07-10 DIAGNOSIS — N200 Calculus of kidney: Secondary | ICD-10-CM

## 2022-07-25 ENCOUNTER — Encounter: Payer: Self-pay | Admitting: Urology

## 2022-08-08 ENCOUNTER — Ambulatory Visit: Payer: 59 | Admitting: Urology

## 2022-08-09 ENCOUNTER — Ambulatory Visit: Payer: 59 | Admitting: Physician Assistant

## 2022-08-09 DIAGNOSIS — N390 Urinary tract infection, site not specified: Secondary | ICD-10-CM | POA: Diagnosis not present

## 2022-08-09 LAB — URINALYSIS, COMPLETE
Bilirubin, UA: NEGATIVE
Glucose, UA: NEGATIVE
Ketones, UA: NEGATIVE
Nitrite, UA: NEGATIVE
Specific Gravity, UA: 1.02 (ref 1.005–1.030)
Urobilinogen, Ur: 0.2 mg/dL (ref 0.2–1.0)
pH, UA: 6 (ref 5.0–7.5)

## 2022-08-09 LAB — BLADDER SCAN AMB NON-IMAGING: Scan Result: 256

## 2022-08-09 LAB — MICROSCOPIC EXAMINATION
RBC, Urine: >30 /hpf — AB (ref 0–2)
WBC, UA: >30 /hpf — AB (ref 0–5)

## 2022-08-09 MED ORDER — SULFAMETHOXAZOLE-TRIMETHOPRIM 800-160 MG PO TABS
1.0000 | ORAL_TABLET | Freq: Two times a day (BID) | ORAL | 0 refills | Status: DC
Start: 2022-08-09 — End: 2022-08-30

## 2022-08-09 NOTE — Progress Notes (Signed)
08/09/2022 4:29 PM   Caleb Wall Dec 31, 1955 [id number]  CC: Chief Complaint  Patient presents with   Follow-up   Urinary Tract Infection   HPI: Caleb Wall is a 67 y.o. 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   Today he reports he developed recurrent dysuria and fatigue 3 weeks into his 4-week antibiotic course.  After completing the fourth week of antibiotics, he developed nocturia and dribbling urinary stream.  He denies perineal pain/fullness, fever, chills, nausea, or vomiting.  He was switched from tamsulosin to alfuzosin due to orthostasis and feels he is tolerating alfuzosin well.  He takes it at night.  In-office UA today positive for 2+ blood, 1+ protein, and 2+ leukocytes; urine microscopy with >30 WBCs/HPF, >30 RBCs/HPF, and many bacteria. PVR , previous measured residual 350 mL.  PMH: Past Medical History:  Diagnosis Date   Allergy    Seasonal   Diabetes mellitus without complication (HCC)    Glaucoma    Hepatitis C    History of kidney stones    Hypertension    Prediabetes     Surgical History: Past Surgical History:  Procedure Laterality Date   ANTERIOR CERVICAL DECOMPRESSION/DISCECTOMY FUSION 4 LEVELS N/A 12/26/2020   Procedure: C3-6 ANTERIOR CERVICAL DECOMPRESSION/DISCECTOMY FUSION 3 LEVELS;  Surgeon: Lucy Chris, MD;  Location: [facility] ORS;  Service: Neurosurgery;  Laterality: N/A;   COLONOSCOPY WITH PROPOFOL N/A 09/29/2021   Procedure: COLONOSCOPY WITH PROPOFOL;  Surgeon: Regis Bill, MD;  Location: [facility] END[facility];  Service: Endoscopy;  Laterality: N/A;   JOINT REPLACEMENT Left 02/2020   Total Hip replacement   KNEE SURGERY Left 2002   Meniscus tear   LITHOTRIPSY  12/13/2020   SHOULDER ARTHROSCOPY WITH OPEN ROTATOR CUFF REPAIR AND  DISTAL CLAVICLE ACROMINECTOMY Left 07/25/2021   Procedure: SHOULDER ARTHROSCOPY WITH OPEN ROTATOR CUFF REPAIR AND DISTAL CLAVICLE ACROMINECTOMY;  Surgeon: Juanell Fairly, MD;  Location: [facility] ORS;  Service: Orthopedics;  Laterality: Left;    Home Medications:  Allergies as of 08/09/2022   No Known Allergies      Medication List        Accurate as of August 09, 2022  4:29 PM. If you have any questions, ask your nurse or doctor.          STOP taking these medications    ciprofloxacin 500 MG tablet Commonly known as: CIPRO       TAKE these medications    albuterol 108 (90 Base) MCG/ACT inhaler Commonly known as: VENTOLIN HFA Inhale 1-2 puffs into the lungs every 6 (six) hours as needed for wheezing or shortness of breath.   alfuzosin 10 MG 24 hr tablet Commonly known as: UROXATRAL Take 1 tablet (10 mg total) by mouth daily with breakfast.   cetirizine 10 MG tablet Commonly known as: ZYRTEC Take 1 tablet by mouth daily.   dorzolamide-timolol 2-0.5 % ophthalmic solution Commonly known as: COSOPT Place 1 drop into both eyes 2 (two) times daily.   HYDROcodone-acetaminophen 5-325 MG tablet Commonly known as: NORCO/VICODIN Take 1 tablet by mouth every 6 (six) hours as needed.   latanoprost 0.005 % ophthalmic solution Commonly known as: XALATAN Place 1 drop into both eyes at bedtime.   lisinopril 40 MG tablet Commonly known as: ZESTRIL Take 40 mg by mouth daily.   sulfamethoxazole-trimethoprim 800-160 MG tablet Commonly known as: BACTRIM  DS Take 1 tablet by mouth 2 (two) times daily.        Allergies:  No Known Allergies  Family History: Family History  Problem Relation Age of Onset   Heart disease Mother    Hypertension Mother    Diabetes Maternal Aunt    Cancer Maternal Uncle        Prostate   Diabetes Paternal Aunt     Social History:   reports that he quit smoking about 3 years ago. His smoking use included cigarettes. He has a 20.00 pack-year  smoking history. He has never used smokeless tobacco. He reports that he does not currently use drugs after having used the following drugs: Marijuana. He reports that he does not drink alcohol.  Physical Exam: There were no vitals taken for this visit.  Constitutional:  Alert and oriented, no acute distress, nontoxic appearing HEENT: [place], AT Cardiovascular: No clubbing, cyanosis, or edema Respiratory: Normal respiratory effort, no increased work of breathing Skin: No rashes, bruises or suspicious lesions Neurologic: Grossly intact, no focal deficits, moving all 4 extremities Psychiatric: Normal mood and affect  Laboratory Data: Results for orders placed or performed in visit on 08/09/22  Microscopic Examination   Urine  Result Value Ref Range   WBC, UA >30 (A) 0 - 5 /hpf   RBC, Urine >30 (A) 0 - 2 /hpf   Epithelial Cells (non renal) 0-10 0 - 10 /hpf   Mucus, UA Present (A) Not Estab.   Bacteria, UA Many (A) None seen/Few  Urinalysis, Complete  Result Value Ref Range   Specific Gravity, UA 1.020 1.005 - 1.030   pH, UA 6.0 5.0 - 7.5   Color, UA Yellow Yellow   Appearance Ur Cloudy (A) Clear   Leukocytes,UA 2+ (A) Negative   Protein,UA 1+ (A) Negative/Trace   Glucose, UA Negative Negative   Ketones, UA Negative Negative   RBC, UA 2+ (A) Negative   Bilirubin, UA Negative Negative   Urobilinogen, Ur 0.2 0.2 - 1.0 mg/dL   Nitrite, UA Negative Negative   Microscopic Examination See below:   BLADDER SCAN AMB NON-IMAGING  Result Value Ref Range   Scan Result 256 ml    Assessment & Plan:   1. Recurrent UTI UA continues to appear grossly infected, this time with increased microscopic hematuria.  He continues to have incomplete bladder emptying despite alpha-blocker.  At this point, I recommend cystoscopy TRUS for evaluation of bladder outlet obstruction and possible prostate abscess.  Will resume tissue penetrating antibiotics, this time for a 6-week course, in the interim.  He noted  increased symptomatic improvement on Cipro compared to Bactrim, however we discussed the risks of long-term fluoroquinolone use and in shared decision making we elected to proceed with Bactrim due to its superior safety profile. - BLADDER SCAN AMB NON-IMAGING - Urinalysis, Complete - sulfamethoxazole-trimethoprim (BACTRIM DS) 800-160 MG tablet; Take 1 tablet by mouth 2 (two) times daily.  Dispense: 84 tablet; Refill: 0 - CULTURE, URINE COMPREHENSIVE  Return in about 2 weeks (around 08/23/2022) for Cysto and TRUS with Dr. Lonna Cobb.  Carman Ching, PA-C  [facility] [address], [address] [place], [place] [address] [phone]

## 2022-08-09 NOTE — Patient Instructions (Addendum)
CONTINUE alfuzosin once daily START Bactrim twice daily x6 weeks We'll see you back in clinic for a cystoscopy and transrectal ultrasound with Dr. Lonna Cobb to better evaluate your prostate. More information about these tests are at the back of this packet.

## 2022-08-12 LAB — CULTURE, URINE COMPREHENSIVE

## 2022-08-16 ENCOUNTER — Other Ambulatory Visit: Payer: Self-pay

## 2022-08-16 DIAGNOSIS — N390 Urinary tract infection, site not specified: Secondary | ICD-10-CM

## 2022-08-16 LAB — CULTURE, URINE COMPREHENSIVE

## 2022-08-16 MED ORDER — LEVOFLOXACIN 500 MG PO TABS
500.0000 mg | ORAL_TABLET | Freq: Every day | ORAL | 0 refills | Status: DC
Start: 2022-08-16 — End: 2022-09-13

## 2022-08-30 ENCOUNTER — Ambulatory Visit: Payer: 59 | Admitting: Urology

## 2022-08-30 ENCOUNTER — Encounter: Payer: Self-pay | Admitting: Urology

## 2022-08-30 VITALS — BP 130/80 | HR 74 | Ht 68.0 in | Wt 236.0 lb

## 2022-08-30 DIAGNOSIS — Z87440 Personal history of urinary (tract) infections: Secondary | ICD-10-CM | POA: Diagnosis not present

## 2022-08-30 DIAGNOSIS — N390 Urinary tract infection, site not specified: Secondary | ICD-10-CM

## 2022-08-30 MED ORDER — DUTASTERIDE 0.5 MG PO CAPS: 0.5000 mg | ORAL_CAPSULE | Freq: Every day | ORAL | 11 refills | Status: DC

## 2022-08-30 NOTE — Progress Notes (Signed)
   08/30/22  CC:  Chief Complaint  Patient presents with   Cysto    HPI: Refer to Caleb Wall's previous note 08/09/2022.  He has done well on alfuzosin and his PVR had improved to 253 mL.  Urine culture at that visit grew Enterococcus and he was started on Levaquin for presumed bacterial prostatitis.  He was scheduled for TRUS however prostate volume calculated on CT performed May 2024 was 42 cc.  Blood pressure 130/80, pulse 74, height 5\' 8"  (1.727 m), weight 236 lb (107 kg). NED. A&Ox3.   No respiratory distress   Abd soft, NT, ND Normal phallus with bilateral descended testicles  Cystoscopy Procedure Note  Patient identification was confirmed, informed consent was obtained, and patient was prepped using Betadine solution.  Lidocaine jelly was administered per urethral meatus.     Pre-Procedure: - Inspection reveals a normal caliber urethral meatus.  Procedure: The flexible cystoscope was introduced without difficulty - No urethral strictures/lesions are present. -Moderate lateral lobe enlargement prostate  -  Mild-moderate elevation  bladder neck - Bilateral ureteral orifices identified - Bladder mucosa  reveals no ulcers, tumors, or lesions - No bladder stones - No trabeculation  Retroflexion shows no intravesical median lobe   Post-Procedure: - Patient tolerated the procedure well  Assessment/ Plan: Moderate lateral lobe enlargement Symptoms improve alfuzosin; he is not interested in surgical management at this time He did inquire about medication to shrink his prostate gland and was interested in starting a 5-ARI.  Rx dutasteride sent to pharmacy; continue alfuzosin 109-month follow-up for IPSS/PVR and UA    Riki Altes, MD

## 2022-09-03 ENCOUNTER — Inpatient Hospital Stay
Admission: RE | Admit: 2022-09-03 | Discharge: 2022-09-03 | Disposition: A | Discharge status: Home / Self Care | Payer: Self-pay | Source: Ambulatory Visit | Attending: Orthopedic Surgery | Admitting: Orthopedic Surgery

## 2022-09-03 ENCOUNTER — Other Ambulatory Visit: Payer: Self-pay

## 2022-09-03 DIAGNOSIS — Z049 Encounter for examination and observation for unspecified reason: Secondary | ICD-10-CM

## 2022-09-06 NOTE — Progress Notes (Unsigned)
Referring Physician:  No referring provider defined for this encounter.  Primary Physician:  [facility]  History of Present Illness: 09/13/2022 [name] has a history of HTN, hyperlipidemia, Hep C, glaucoma. History of cocaine use.   History of ACDF C3-C7 for myelopathy by Dr. Adriana Simas on 12/26/20. He had some residual weakness and numbness postop.   He has been seeing PMR for his lumbar spine. Per Whitney's last note, EMG nerve conduction study performed on 09/28/2020 by Dr. Theodis Aguas revealed chronic right L4 radiculopathy upper motor neuron signs were also noted on exam.   He has 3 week history of constant neck pain with radiation into left arm to his elbow. He has minimal ROM of his neck due to pain. No known injury. He has numbness and tingling in right hand and some in left arm as well. Right side was worse prior to his cervical surgery. He has weakness in right arm that is new. He has weakness and numbness in right leg as well that is also new in last 3 weeks- it is improving. No pain in right leg. He continues with intermittent LBP.   Bowel/Bladder Dysfunction: none  Conservative measures:  Physical therapy: none Multimodal medical therapy including regular antiinflammatories: tylenol, ultram, voltaren gel, mobic Injections:  05/16/2022: Bilateral S1 transforaminal ESI (50% relief) 11/22/2021: Right L5-S1 transforaminal ESI (75% relief) 07/04/2021: Left sacroiliac joint injection (no relief) 03/20/2021: Left sacroiliac joint injection (70% relief) 02/15/2020: Left SI joint injection at [facility] (good relief) 12/30/2019: Left L4-5 transforaminal ESI performed at [facility]   Past Surgery:  History of ACDF C3-C7 for myelopathy by Dr. Adriana Simas on 12/26/20  Caleb Wall has no symptoms of cervical myelopathy.  The symptoms are causing a significant impact on the patient's life.   Review of Systems:  A 10 point review of systems is negative, except for the pertinent positives and  negatives detailed in the HPI.  Past Medical History: Past Medical History:  Diagnosis Date   Allergy    Seasonal   Diabetes mellitus without complication (HCC)    Glaucoma    Hepatitis C    History of kidney stones    Hypertension    Prediabetes     Past Surgical History: Past Surgical History:  Procedure Laterality Date   ANTERIOR CERVICAL DECOMPRESSION/DISCECTOMY FUSION 4 LEVELS N/A 12/26/2020   Procedure: C3-6 ANTERIOR CERVICAL DECOMPRESSION/DISCECTOMY FUSION 3 LEVELS;  Surgeon: Lucy Chris, MD;  Location: [facility] ORS;  Service: Neurosurgery;  Laterality: N/A;   COLONOSCOPY WITH PROPOFOL N/A 09/29/2021   Procedure: COLONOSCOPY WITH PROPOFOL;  Surgeon: Regis Bill, MD;  Location: [facility] ENDOSCOPY;  Service: Endoscopy;  Laterality: N/A;   JOINT REPLACEMENT Left 02/2020   Total Hip replacement   KNEE SURGERY Left 2002   Meniscus tear   LITHOTRIPSY  12/13/2020   SHOULDER ARTHROSCOPY WITH OPEN ROTATOR CUFF REPAIR AND DISTAL CLAVICLE ACROMINECTOMY Left 07/25/2021   Procedure: SHOULDER ARTHROSCOPY WITH OPEN ROTATOR CUFF REPAIR AND DISTAL CLAVICLE ACROMINECTOMY;  Surgeon: Juanell Fairly, MD;  Location: [facility] ORS;  Service: Orthopedics;  Laterality: Left;    Allergies: Allergies as of 09/13/2022   (No Known Allergies)    Medications: Outpatient Encounter Medications as of 09/13/2022  Medication Sig   albuterol (VENTOLIN HFA) 108 (90 Base) MCG/ACT inhaler Inhale 1-2 puffs into the lungs every 6 (six) hours as needed for wheezing or shortness of breath.   Aspirin-Salicylamide-Caffeine (ARTHRITIS STRENGTH BC POWDER PO) Take by mouth.   brimonidine (ALPHAGAN) 0.2 % ophthalmic solution Place  1 drop into both eyes 2 (two) times daily.   diclofenac Sodium (VOLTAREN ARTHRITIS PAIN) 1 % GEL Apply topically 4 (four) times daily.   dorzolamide-timolol (COSOPT) 22.3-6.8 MG/ML ophthalmic solution Place 1 drop into both eyes 2 (two) times daily.   dutasteride (AVODART) 0.5 MG capsule Take 1  capsule (0.5 mg total) by mouth daily.   HYDROcodone-acetaminophen (NORCO/VICODIN) 5-325 MG tablet Take 1 tablet by mouth every 6 (six) hours as needed.   latanoprost (XALATAN) 0.005 % ophthalmic solution Place 1 drop into both eyes at bedtime.   lisinopril (ZESTRIL) 40 MG tablet Take 40 mg by mouth daily.   meloxicam (MOBIC) 15 MG tablet Take 1 tablet by mouth daily.   sildenafil (VIAGRA) 100 MG tablet Take 100 mg by mouth as needed.   traMADol (ULTRAM) 50 MG tablet Take 50 mg by mouth every 6 (six) hours as needed.   TRULICITY 1.5 MG/0.5ML SOPN Inject into the skin.   [DISCONTINUED] alfuzosin (UROXATRAL) 10 MG 24 hr tablet Take 1 tablet (10 mg total) by mouth daily with breakfast.   [DISCONTINUED] cetirizine (ZYRTEC) 10 MG tablet Take 1 tablet by mouth daily.   [DISCONTINUED] levofloxacin (LEVAQUIN) 500 MG tablet Take 1 tablet (500 mg total) by mouth daily for 28 days.   No facility-administered encounter medications on file as of 09/13/2022.    Social History: Social History   Tobacco Use   Smoking status: Former    Current packs/day: 0.00    Average packs/day: 0.5 packs/day for 40.0 years (20.0 ttl pk-yrs)    Types: Cigarettes    Start date: 07/1979    Quit date: 07/2019    Years since quitting: 3.1   Smokeless tobacco: Never  Vaping Use   Vaping status: Never Used  Substance Use Topics   Alcohol use: No   Drug use: Not Currently    Types: Marijuana    Comment: History of cocaine use.     Family Medical History: Family History  Problem Relation Age of Onset   Heart disease Mother    Hypertension Mother    Diabetes Maternal Aunt    Cancer Maternal Uncle        Prostate   Diabetes Paternal Aunt     Physical Examination: Vitals:   09/13/22 1018  BP: (!) 142/98    General: Patient is well developed, well nourished, calm, collected, and in no apparent distress. Attention to examination is appropriate.  Respiratory: Patient is breathing without any  difficulty.   NEUROLOGICAL:     Awake, alert, oriented to person, place, and time.  Speech is clear and fluent. Fund of knowledge is appropriate.   Cranial Nerves: Pupils equal round and reactive to light.  Facial tone is symmetric.    Well healed cervical incision.   No lower lumbar tenderness.   No abnormal lesions on exposed skin.   Strength: Side Biceps Triceps Deltoid Interossei Grip Wrist Ext. Wrist Flex.  R 5 5 5 5 5 5 5   L 5 5 5 5 5 5 5    Side Iliopsoas Quads Hamstring PF DF EHL  R 4+ 5 5 5 5 5   L 5 5 5 5 5 5    Reflexes are 2+ and symmetric at the biceps, brachioradialis, patella and achilles.   Hoffman's is absent.   Bilateral upper and lower extremity sensation is intact to light touch.     Gait is slow. He uses a cane.   Medical Decision Making  Imaging: Cervical xrays dated 01/23/22:  FINDINGS:  Paracervical soft tissues are stable, within normal limits.   Metallic fusion hardware of previous C3-C6 ACDF is again noted with stable  good positioning. There is no evidence of loosening of the numerous  threaded screws. There has been further incorporation of the bone plug at  C3/C4 with resolution of the previous minimal lucency. There is now  complete incorporation of the intervertebral bone plugs at all levels.  There is stable minimal retrolisthesis of C3 with respect to C4 with a  smoother appearing transition. Similar degenerative changes facet joints  and C1, C2 articulation.   IMPRESSION: Further bony incorporation of the interbody fusion plug at  C3/C4 with now complete incorporation of all the intervertebral bone plugs  and otherwise stable appearance of the cervical spine status post C3-C6  ACDF without complicating features noted.    Electronically Signed by:  Collene Mares, MD, [facility]  Electronically Signed on:  01/23/2022 10:17 AM   I have personally reviewed the images and agree with the above interpretation.  Assessment and  Plan: Caleb Wall is a pleasant 67 y.o. male has history of ACDF C3-C7 for myelopathy by Dr. Adriana Simas on 12/26/20.   3 week history of constant neck pain with radiation to left elbow along with limited ROM of his neck. He's had numbness/tingling on right since surgery, but is now on left.   Previous cervical xrays show ACDF C3-C6.   History of chronic intermittent LBP. He notes weakness in right leg that started 3 weeks ago as well. It is improving. He has numbness and tingling as well. No leg pain.   He has been seeing PMR for his lumbar spine. Per Whitney's last note, EMG nerve conduction study performed on 09/28/2020 by Dr. Theodis Aguas revealed chronic right L4 radiculopathy upper motor neuron signs were also noted on exam.   Treatment options discussed with patient and following plan made:   - MRI of cervical spine to evaluate cervical radiculopathy.  - MRI of lumbar spine to evaluate left leg weakness.  - Depending on results of MRI , may consider PT and/or injections.  - He asks about pain medication. He is on ultram from PMR. He will discuss further with them.  - Discussed red flag symptoms. Go to ED if worsening weakness or any bowel/bladder issues.  - Will schedule follow up with me to review MRI results once I get them back.   BP was elevated. He states is is always up at doctors. No symptoms of chest pain, shortness of breath, blurry vision, or headaches. He checks BP at home and it generally runs normal. Will recheck at home and call PCP if not improved. If he develops CP, SOB, blurry vision, or headaches, then he will go to ED.     I spent a total of 35 minutes in face-to-face and non-face-to-face activities related to this patient's care today including review of outside records, review of imaging, review of symptoms, physical exam, discussion of differential diagnosis, discussion of treatment options, and documentation.   Thank you for involving me in the care of this patient.   Drake Leach  PA-C Dept. of Neurosurgery

## 2022-09-13 ENCOUNTER — Encounter: Payer: Self-pay | Admitting: Orthopedic Surgery

## 2022-09-13 ENCOUNTER — Ambulatory Visit: Payer: 59 | Admitting: Orthopedic Surgery

## 2022-09-13 VITALS — BP 138/90 | Wt 235.0 lb

## 2022-09-13 DIAGNOSIS — Z981 Arthrodesis status: Secondary | ICD-10-CM | POA: Diagnosis not present

## 2022-09-13 DIAGNOSIS — R29898 Other symptoms and signs involving the musculoskeletal system: Secondary | ICD-10-CM

## 2022-09-13 DIAGNOSIS — M5412 Radiculopathy, cervical region: Secondary | ICD-10-CM

## 2022-09-13 DIAGNOSIS — M542 Cervicalgia: Secondary | ICD-10-CM | POA: Diagnosis not present

## 2022-09-13 NOTE — Patient Instructions (Signed)
It was so nice to see you today. Thank you so much for coming in.    I want to get an MRI of your neck and lower back to look into things further. We will get this approved through your insurance and [facility] will call you to schedule the appointment.   [facility] (building with the white pillars) is off of [address]. The address is [address], [place], [place] [address].   After you have the MRIs done, it will take 5-7 days for me to get the results. Once I have them, we will call you to schedule a follow up visit with me to review them.   Your blood pressure was slightly elevated today. I want you to recheck it at home and follow up with your PCP if it remains high. If you have any chest pain, shortness of breath, blurry vision, or headaches then you need to go to ED.    Please do not hesitate to call if you have any questions or concerns. You can also message me in MyChart.   Drake Leach PA-C [phone]

## 2022-09-17 ENCOUNTER — Other Ambulatory Visit: Payer: Self-pay

## 2022-09-17 ENCOUNTER — Inpatient Hospital Stay
Admission: RE | Admit: 2022-09-17 | Discharge: 2022-09-17 | Disposition: A | Discharge status: Home / Self Care | Payer: Self-pay | Source: Ambulatory Visit | Attending: Orthopedic Surgery | Admitting: Orthopedic Surgery

## 2022-09-17 DIAGNOSIS — Z049 Encounter for examination and observation for unspecified reason: Secondary | ICD-10-CM

## 2022-09-18 ENCOUNTER — Ambulatory Visit: Payer: Self-pay | Admitting: Surgery

## 2022-09-18 NOTE — H&P (Signed)
Subjective:   CC: Chest wall mass [R22.2]  HPI:  Caleb Wall is a 67 y.o. male who was referred by Jasmine December Fitzgeral* for evaluation of above. First noted several years ago.  Growing slowly and more uncomfortable.   Past Medical History:  has a past medical history of Diabetes (CMS/HHS-HCC) (04/23/2013), Hematologic abnormality (03/07/2020), History of hepatitis C (01/25/2021), Hypertension, and Other hyperlipidemia (01/21/2020).  Past Surgical History:  has a past surgical history that includes Eye surgery (Left); arthroplasty hip total (Left, 03/06/2020); open reduction femoral neck fracture (Left, 03/06/2020); Fracture surgery (03/06/2020); cystourethroscopy w/ureteral cathization w/wo retrograde pyelogram (Right, 12/13/2020); cystourethroscopy w/ureteroscopy &/or pyeloscopy w/lithotripsy (Right, 12/13/2020); ureterogram retrograde w/wo kub (Right, 12/13/2020); cystourethroscopy w/ureteroscopy &/or pyeloscopy (Right, 12/13/2020); C3-6 ANTERIOR CERVICAL DECOMPRESSION/DISCECTOMY FUSION (12/26/2020); and Colon @ [facility] (09/29/2021).  Family History: family history includes High blood pressure (Hypertension) in his father.  Social History:  reports that he quit smoking about 3 years ago. His smoking use included cigarettes. He started smoking about 43 years ago. He has a 30 pack-year smoking history. He has never used smokeless tobacco. He reports that he does not currently use alcohol. He reports that he does not use drugs.  Current Medications: has a current medication list which includes the following prescription(s): albuterol mdi (proventil, ventolin, proair) hfa, albuterol mdi (proventil, ventolin, proair) hfa, diclofenac, latanoprost, meloxicam, sildenafil, tamsulosin, tramadol, trulicity, brimonidine, dorzolamide-timolol, and dulaglutide.  Allergies:  No Known Allergies  ROS:  A 15 point review of systems was performed and pertinent positives and negatives noted in HPI   Objective:      BP (!) 157/99   Pulse 81   Ht 168.9 cm (5' 6.5")   Wt (!) 107 kg (235 lb 14.3 oz)   BMI 37.51 kg/m   Constitutional :  No distress, cooperative, alert  Lymphatics/Throat:  Supple with no lymphadenopathy  Respiratory:  Clear to auscultation bilaterally  Cardiovascular:  Regular rate and rhythm  Gastrointestinal: Soft, non-tender, non-distended, no organomegaly.  Musculoskeletal: Steady gait and movement  Skin: Cool and moist, 10cm x 10cm ish left anterior chest wall, near shoulder  Psychiatric: Normal affect, non-agitated, not confused         LABS:  N/a   RADS: N/a  Assessment:      Chest wall mass [R22.2]- will proceed with excision in OR due to size.  Plan:     1. Chest wall mass [R22.2] Discussed surgical excision.  Alternatives include continued observation.  Benefits include possible symptom relief, pathologic evaluation, improved cosmesis. Discussed the risk of surgery including recurrence, chronic pain, post-op infxn, poor cosmesis, poor/delayed wound healing, and possible re-operation to address said risks. The risks of general anesthetic, if used, includes MI, CVA, sudden death or even reaction to anesthetic medications also discussed.  Typical post-op recovery time of 3-5 days with possible activity restrictions were also discussed.  The patient verbalized understanding and all questions were answered to the patient's satisfaction.  2. Patient has elected to proceed with surgical treatment. Procedure will be scheduled. Supine position  labs/images/medications/previous chart entries reviewed personally and relevant changes/updates noted above.

## 2022-09-20 DIAGNOSIS — R222 Localized swelling, mass and lump, trunk: Secondary | ICD-10-CM

## 2022-09-20 HISTORY — DX: Localized swelling, mass and lump, trunk: R22.2

## 2022-09-29 ENCOUNTER — Other Ambulatory Visit: Payer: 59

## 2022-10-01 ENCOUNTER — Ambulatory Visit
Admission: RE | Admit: 2022-10-01 | Discharge: 2022-10-01 | Disposition: A | Discharge status: Home / Self Care | Payer: 59 | Source: Ambulatory Visit | Attending: Orthopedic Surgery | Admitting: Orthopedic Surgery

## 2022-10-01 DIAGNOSIS — M542 Cervicalgia: Secondary | ICD-10-CM

## 2022-10-01 DIAGNOSIS — R29898 Other symptoms and signs involving the musculoskeletal system: Secondary | ICD-10-CM

## 2022-10-01 DIAGNOSIS — M5412 Radiculopathy, cervical region: Secondary | ICD-10-CM

## 2022-10-01 DIAGNOSIS — Z981 Arthrodesis status: Secondary | ICD-10-CM

## 2022-10-02 ENCOUNTER — Encounter
Admission: RE | Admit: 2022-10-02 | Discharge: 2022-10-02 | Disposition: A | Discharge status: Home / Self Care | Payer: 59 | Source: Ambulatory Visit | Attending: Surgery | Admitting: Surgery

## 2022-10-02 VITALS — Ht 67.0 in | Wt 229.0 lb

## 2022-10-02 DIAGNOSIS — I10 Essential (primary) hypertension: Secondary | ICD-10-CM

## 2022-10-02 DIAGNOSIS — E119 Type 2 diabetes mellitus without complications: Secondary | ICD-10-CM

## 2022-10-02 DIAGNOSIS — Z01812 Encounter for preprocedural laboratory examination: Secondary | ICD-10-CM

## 2022-10-02 HISTORY — DX: Benign prostatic hyperplasia without lower urinary tract symptoms: N40.0

## 2022-10-02 HISTORY — DX: Sepsis, unspecified organism: A41.9

## 2022-10-02 HISTORY — DX: Primary open-angle glaucoma, left eye, stage unspecified: H40.1120

## 2022-10-02 HISTORY — DX: Urinary tract infection, site not specified: N39.0

## 2022-10-02 HISTORY — DX: Disease of spinal cord, unspecified: G95.9

## 2022-10-02 HISTORY — DX: Hyperlipidemia, unspecified: E78.5

## 2022-10-02 NOTE — Patient Instructions (Addendum)
Your procedure is scheduled on: Thursday, August 22 Report to the Registration Desk on the 1st floor of the [facility] To find out your arrival time, please call [phone] between 1PM - 3PM on: Wednesday, August 21 If your arrival time is 6:00 am, do not arrive before that time as the [facility] entrance doors do not open until 6:00 am.  REMEMBER: Instructions that are not followed completely may result in serious medical risk, up to and including death; or upon the discretion of your surgeon and anesthesiologist your surgery may need to be rescheduled.  Do not eat food after midnight the night before surgery.  No gum chewing or hard candies.  You may however, drink water up to 2 hours before you are scheduled to arrive for your surgery. Do not drink anything within 2 hours of your scheduled arrival time.  One week prior to surgery: starting August 15 Stop Anti-inflammatories (NSAIDS) such as Advil, Aleve, Ibuprofen, Motrin, Naproxen, Naprosyn and Aspirin based products such as Excedrin, Goody's Powder, BC Powder. Stop ANY OVER THE COUNTER supplements until after surgery. You may however, continue to take Tylenol if needed for pain up until the day of surgery.  Continue taking all prescribed medications with the exception of the following:  Sildenafil (Viagra) hold at least 2 days before surgery.  Trulicity - hold 7 days before surgery. Do NOT take Trulicity on Wednesday, August 21. Resume on your regular day, Wednesday, August 28.  TAKE ONLY THESE MEDICATIONS THE MORNING OF SURGERY WITH A SIP OF WATER:  Brimonidine eye drops Dorzolamide eye drops Dutasteride Tramadol if needed for pain  Use albuterol inhaler on the day of surgery and bring to the hospital.  No Alcohol for 24 hours before or after surgery.  No Smoking including e-cigarettes for 24 hours before surgery.  No chewable tobacco products for at least 6 hours before surgery.  No nicotine patches on the day of  surgery.  Do not use any "recreational" drugs for at least a week (preferably 2 weeks) before your surgery.  Please be advised that the combination of cocaine and anesthesia may have negative outcomes, up to and including death. If you test positive for cocaine, your surgery will be cancelled.  On the morning of surgery brush your teeth with toothpaste and water, you may rinse your mouth with mouthwash if you wish. Do not swallow any toothpaste or mouthwash.  Use CHG Soap as directed on instruction sheet.  Do not wear jewelry, make-up, hairpins, clips or nail polish.  Do not wear lotions, powders, or perfumes.   Do not shave body hair from the neck down 48 hours before surgery.  Contact lenses, hearing aids and dentures may not be worn into surgery.  Do not bring valuables to the hospital. [facility] is not responsible for any missing/lost belongings or valuables.   Notify your doctor if there is any change in your medical condition (cold, fever, infection).  Wear comfortable clothing (specific to your surgery type) to the hospital.  After surgery, you can help prevent lung complications by doing breathing exercises.  Take deep breaths and cough every 1-2 hours.   If you are being discharged the day of surgery, you will not be allowed to drive home. You will need a responsible individual to drive you home and stay with you for 24 hours after surgery.   If you are taking public transportation, you will need to have a responsible individual with you.  Please call the Pre-admissions  Testing Dept. at [phone] if you have any questions about these instructions.  Surgery Visitation Policy:  Patients having surgery or a procedure may have two visitors.  Children under the age of 41 must have an adult with them who is not the patient.     Preparing for Surgery with CHLORHEXIDINE GLUCONATE (CHG) Soap  Chlorhexidine Gluconate (CHG) Soap  o An antiseptic cleaner that kills  germs and bonds with the skin to continue killing germs even after washing  o Used for showering the night before surgery and morning of surgery  Before surgery, you can play an important role by reducing the number of germs on your skin.  CHG (Chlorhexidine gluconate) soap is an antiseptic cleanser which kills germs and bonds with the skin to continue killing germs even after washing.  Please do not use if you have an allergy to CHG or antibacterial soaps. If your skin becomes reddened/irritated stop using the CHG.  1. Shower the NIGHT BEFORE SURGERY and the MORNING OF SURGERY with CHG soap.  2. If you choose to wash your hair, wash your hair first as usual with your normal shampoo.  3. After shampooing, rinse your hair and body thoroughly to remove the shampoo.  4. Use CHG as you would any other liquid soap. You can apply CHG directly to the skin and wash gently with a scrungie or a clean washcloth.  5. Apply the CHG soap to your body only from the neck down. Do not use on open wounds or open sores. Avoid contact with your eyes, ears, mouth, and genitals (private parts). Wash face and genitals (private parts) with your normal soap.  6. Wash thoroughly, paying special attention to the area where your surgery will be performed.  7. Thoroughly rinse your body with warm water.  8. Do not shower/wash with your normal soap after using and rinsing off the CHG soap.  9. Pat yourself dry with a clean towel.  10. Wear clean pajamas to bed the night before surgery.  12. Place clean sheets on your bed the night of your first shower and do not sleep with pets.  13. Shower again with the CHG soap on the day of surgery prior to arriving at the hospital.  14. Do not apply any deodorants/lotions/powders.  15. Please wear clean clothes to the hospital.

## 2022-10-03 ENCOUNTER — Encounter
Admission: RE | Admit: 2022-10-03 | Discharge: 2022-10-03 | Disposition: A | Discharge status: Home / Self Care | Payer: 59 | Source: Ambulatory Visit | Attending: Surgery | Admitting: Surgery

## 2022-10-03 DIAGNOSIS — Z01818 Encounter for other preprocedural examination: Secondary | ICD-10-CM | POA: Insufficient documentation

## 2022-10-03 DIAGNOSIS — Z01810 Encounter for preprocedural cardiovascular examination: Secondary | ICD-10-CM | POA: Diagnosis not present

## 2022-10-03 DIAGNOSIS — I10 Essential (primary) hypertension: Secondary | ICD-10-CM | POA: Diagnosis not present

## 2022-10-03 DIAGNOSIS — E119 Type 2 diabetes mellitus without complications: Secondary | ICD-10-CM | POA: Diagnosis not present

## 2022-10-03 DIAGNOSIS — Z01812 Encounter for preprocedural laboratory examination: Secondary | ICD-10-CM

## 2022-10-03 LAB — BASIC METABOLIC PANEL
Anion gap: 8 (ref 5–15)
BUN: 17 mg/dL (ref 8–23)
CO2: 23 mmol/L (ref 22–32)
Calcium: 8.9 mg/dL (ref 8.9–10.3)
Chloride: 107 mmol/L (ref 98–111)
Creatinine, Ser: 0.8 mg/dL (ref 0.61–1.24)
GFR, Estimated: >60 mL/min (ref >60)
Glucose, Bld: 121 mg/dL — ABNORMAL HIGH (ref 70–99)
Potassium: 3.6 mmol/L (ref 3.5–5.1)
Sodium: 138 mmol/L (ref 135–145)

## 2022-10-03 LAB — CBC
HCT: 38.9 % — ABNORMAL LOW (ref 39.0–52.0)
Hemoglobin: 12.7 g/dL — ABNORMAL LOW (ref 13.0–17.0)
MCH: 28.7 pg (ref 26.0–34.0)
MCHC: 32.6 g/dL (ref 30.0–36.0)
MCV: 87.8 fL (ref 80.0–100.0)
Platelets: 213 10*3/uL (ref 150–400)
RBC: 4.43 MIL/uL (ref 4.22–5.81)
RDW: 13.9 % (ref 11.5–15.5)
WBC: 6.6 10*3/uL (ref 4.0–10.5)
nRBC: 0 % (ref 0.0–0.2)

## 2022-10-04 NOTE — Progress Notes (Signed)
  Perioperative Services Pre-Admission/Anesthesia Testing    Date: 10/04/22  Name: Caleb Wall MRN:   [id number]  Re: GLP-1 clearance and provider recommendations   Planned Surgical Procedure(s):    Case: [id number] Date/Time: 10/11/22 0715   Procedure: EXCISION MASS CHEST WALL (Chest)   Anesthesia type: General   Pre-op diagnosis: chest wall mass R22.2   Location: [facility] OR ROOM 09 / [facility]   Surgeons: Sung Amabile, DO      Clinical Notes:  Patient is scheduled for the above procedure with the indicated provider/surgeon. In review of his medication reconciliation it was noted that patient is on a prescribed GLP-1 medication. Per guidelines issued by the American Society of Anesthesiologists (ASA), it is recommended that these medications be held for 7 days prior to the patient undergoing any type of elective surgical procedure. The patient is taking the following GLP-1 medication:  []  SEMAGLUTIDE   []  EXENATIDE  []  LIRAGLUTIDE   []  LIXISENATIDE  [x]  DULAGLUTIDE     []  TIRZEPATIDE (GLP-1/GIP)  Reached out to prescribing provider Sampson Goon, MD) to make them aware of the guidelines from anesthesia. Given that this patient takes the prescribed GLP-1 medication for his  diabetes diagnosis, rather than for weight loss, recommendations from the prescribing provider were solicited. Prescribing provider made aware of the following so that informed decision/POC can be developed for this patient that may be taking medications belonging to these drug classes:  Oral GLP-1 medications will be held 1 day prior to surgery.  Injectable GLP-1 medications will be held 7 days prior to surgery.  Metformin is routinely held 48 hours prior to surgery due to renal concerns, potential need for contrasted imaging perioperatively, and the potential for tissue hypoxia leading to drug induced lactic acidosis.  All SGLT2i medications are held 72 hours prior to surgery as they can be  associated with the increased potential for developing euglycemic diabetic ketoacidosis (EDKA).   Impression and Plan:  Caleb Wall is on a prescribed GLP-1 medication, which induces the known side effect of decreased gastric emptying. Efforts are bring made to mitigate the risk of perioperative hyperglycemic events, as elevated blood glucose levels have been found to contribute to intra/postoperative complications. Additionally, hyperglycemic extremes can potentially necessitate the postponing of a patient's elective case in order to better optimize perioperative glycemic control, again with the aforementioned guidelines in place. With this in mind, recommendations have been sought from the prescribing provider, who has cleared patient to proceed with holding the prescribed GLP-1 as per the guidelines from the ASA.   Provider recommending: no further recommendations received from the prescribing provider.  Copy of signed clearance and recommendations placed on patient's chart for inclusion in their medical record and for review by the surgical/anesthetic team on the day of his procedure.   Quentin Mulling, MSN, APRN, FNP-C, [facility]  Peri-operative Services Nurse Practitioner Phone: [phone] 10/04/22 9:38 AM  NOTE: This note has been prepared using Dragon dictation software. Despite my best ability to proofread, there is always the potential that unintentional transcriptional errors may still occur from this process.

## 2022-10-11 ENCOUNTER — Encounter
Admission: RE | Disposition: A | Discharge status: Home / Self Care | Payer: Self-pay | Source: Ambulatory Visit | Attending: Surgery

## 2022-10-11 ENCOUNTER — Other Ambulatory Visit: Payer: Self-pay

## 2022-10-11 ENCOUNTER — Encounter: Payer: Self-pay | Admitting: Surgery

## 2022-10-11 ENCOUNTER — Ambulatory Visit
Admission: RE | Admit: 2022-10-11 | Discharge: 2022-10-11 | Disposition: A | Discharge status: Home / Self Care | Payer: 59 | Source: Ambulatory Visit | Attending: Surgery | Admitting: Surgery

## 2022-10-11 ENCOUNTER — Ambulatory Visit: Payer: 59 | Admitting: Urgent Care

## 2022-10-11 DIAGNOSIS — I10 Essential (primary) hypertension: Secondary | ICD-10-CM | POA: Insufficient documentation

## 2022-10-11 DIAGNOSIS — E119 Type 2 diabetes mellitus without complications: Secondary | ICD-10-CM | POA: Diagnosis not present

## 2022-10-11 DIAGNOSIS — Z8619 Personal history of other infectious and parasitic diseases: Secondary | ICD-10-CM | POA: Insufficient documentation

## 2022-10-11 DIAGNOSIS — Z7985 Long-term (current) use of injectable non-insulin antidiabetic drugs: Secondary | ICD-10-CM | POA: Insufficient documentation

## 2022-10-11 DIAGNOSIS — Z01812 Encounter for preprocedural laboratory examination: Secondary | ICD-10-CM

## 2022-10-11 DIAGNOSIS — D171 Benign lipomatous neoplasm of skin and subcutaneous tissue of trunk: Secondary | ICD-10-CM | POA: Insufficient documentation

## 2022-10-11 DIAGNOSIS — Z87891 Personal history of nicotine dependence: Secondary | ICD-10-CM | POA: Insufficient documentation

## 2022-10-11 HISTORY — PX: MASS EXCISION: SHX2000

## 2022-10-11 LAB — GLUCOSE, CAPILLARY
Glucose-Capillary: 85 mg/dL (ref 70–99)
Glucose-Capillary: 115 mg/dL — ABNORMAL HIGH (ref 70–99)

## 2022-10-11 SURGERY — EXCISION MASS
Anesthesia: General | Site: Chest

## 2022-10-11 MED ORDER — DEXAMETHASONE SODIUM PHOSPHATE 10 MG/ML IJ SOLN
INTRAMUSCULAR | Status: DC | PRN
  Administered 2022-10-11: 10 mg via INTRAVENOUS

## 2022-10-11 MED ORDER — CHLORHEXIDINE GLUCONATE 0.12 % MT SOLN
OROMUCOSAL | Status: AC
  Filled 2022-10-11: qty 15

## 2022-10-11 MED ORDER — CHLORHEXIDINE GLUCONATE 0.12 % MT SOLN
15.0000 mL | Freq: Once | OROMUCOSAL | Status: AC
  Administered 2022-10-11: 15 mL via OROMUCOSAL

## 2022-10-11 MED ORDER — ROCURONIUM BROMIDE 100 MG/10ML IV SOLN
INTRAVENOUS | Status: DC | PRN
  Administered 2022-10-11: 60 mg via INTRAVENOUS

## 2022-10-11 MED ORDER — CEFAZOLIN SODIUM-DEXTROSE 2-4 GM/100ML-% IV SOLN
2.0000 g | INTRAVENOUS | Status: AC
  Administered 2022-10-11: 2 g via INTRAVENOUS

## 2022-10-11 MED ORDER — PHENYLEPHRINE HCL-NACL 20-0.9 MG/250ML-% IV SOLN
INTRAVENOUS | Status: AC
  Filled 2022-10-11: qty 250

## 2022-10-11 MED ORDER — CHLORHEXIDINE GLUCONATE CLOTH 2 % EX PADS: 6.0000 | MEDICATED_PAD | Freq: Once | CUTANEOUS | Status: DC

## 2022-10-11 MED ORDER — LIDOCAINE HCL (CARDIAC) PF 100 MG/5ML IV SOSY
PREFILLED_SYRINGE | INTRAVENOUS | Status: DC | PRN
  Administered 2022-10-11: 100 mg via INTRAVENOUS

## 2022-10-11 MED ORDER — PHENYLEPHRINE HCL-NACL 20-0.9 MG/250ML-% IV SOLN
INTRAVENOUS | Status: DC | PRN
Start: 2022-10-11 — End: 2022-10-11
  Administered 2022-10-11: 20 ug/min via INTRAVENOUS

## 2022-10-11 MED ORDER — ORAL CARE MOUTH RINSE: 15.0000 mL | Freq: Once | OROMUCOSAL | Status: AC

## 2022-10-11 MED ORDER — BUPIVACAINE HCL (PF) 0.5 % IJ SOLN
INTRAMUSCULAR | Status: AC
  Filled 2022-10-11: qty 30

## 2022-10-11 MED ORDER — PROPOFOL 10 MG/ML IV BOLUS
INTRAVENOUS | Status: DC | PRN
  Administered 2022-10-11: 200 mg via INTRAVENOUS

## 2022-10-11 MED ORDER — FENTANYL CITRATE (PF) 100 MCG/2ML IJ SOLN: 25.0000 ug | INTRAMUSCULAR | Status: DC | PRN

## 2022-10-11 MED ORDER — ACETAMINOPHEN 10 MG/ML IV SOLN
INTRAVENOUS | Status: DC | PRN
  Administered 2022-10-11: 1000 mg via INTRAVENOUS

## 2022-10-11 MED ORDER — PROPOFOL 1000 MG/100ML IV EMUL
INTRAVENOUS | Status: AC
  Filled 2022-10-11: qty 100

## 2022-10-11 MED ORDER — ONDANSETRON HCL 4 MG/2ML IJ SOLN
INTRAMUSCULAR | Status: DC | PRN
  Administered 2022-10-11: 4 mg via INTRAVENOUS

## 2022-10-11 MED ORDER — LIDOCAINE-EPINEPHRINE 1 %-1:100000 IJ SOLN
INTRAMUSCULAR | Status: DC | PRN
  Administered 2022-10-11: 20 mL

## 2022-10-11 MED ORDER — ACETAMINOPHEN 10 MG/ML IV SOLN
INTRAVENOUS | Status: AC
  Filled 2022-10-11: qty 100

## 2022-10-11 MED ORDER — SODIUM CHLORIDE 0.9 % IV SOLN: INTRAVENOUS | Status: DC

## 2022-10-11 MED ORDER — SUGAMMADEX SODIUM 200 MG/2ML IV SOLN
INTRAVENOUS | Status: DC | PRN
  Administered 2022-10-11: 300 mg via INTRAVENOUS

## 2022-10-11 MED ORDER — FAMOTIDINE 20 MG PO TABS
ORAL_TABLET | ORAL | Status: AC
  Filled 2022-10-11: qty 1

## 2022-10-11 MED ORDER — MIDAZOLAM HCL 2 MG/2ML IJ SOLN
INTRAMUSCULAR | Status: DC | PRN
  Administered 2022-10-11: 1 mg via INTRAVENOUS

## 2022-10-11 MED ORDER — MIDAZOLAM HCL 2 MG/2ML IJ SOLN
INTRAMUSCULAR | Status: AC
  Filled 2022-10-11: qty 2

## 2022-10-11 MED ORDER — FENTANYL CITRATE (PF) 100 MCG/2ML IJ SOLN
INTRAMUSCULAR | Status: AC
  Filled 2022-10-11: qty 2

## 2022-10-11 MED ORDER — SODIUM CHLORIDE 0.9 % IV SOLN: INTRAVENOUS | Status: DC | PRN

## 2022-10-11 MED ORDER — 0.9 % SODIUM CHLORIDE (POUR BTL) OPTIME
TOPICAL | Status: DC | PRN
  Administered 2022-10-11: 120 mL

## 2022-10-11 MED ORDER — OXYCODONE HCL 5 MG PO TABS: 5.0000 mg | ORAL_TABLET | Freq: Once | ORAL | Status: DC | PRN

## 2022-10-11 MED ORDER — FAMOTIDINE 20 MG PO TABS
20.0000 mg | ORAL_TABLET | Freq: Once | ORAL | Status: AC
  Administered 2022-10-11: 20 mg via ORAL

## 2022-10-11 MED ORDER — CEFAZOLIN SODIUM-DEXTROSE 2-4 GM/100ML-% IV SOLN
INTRAVENOUS | Status: AC
  Filled 2022-10-11: qty 100

## 2022-10-11 MED ORDER — FENTANYL CITRATE (PF) 100 MCG/2ML IJ SOLN
INTRAMUSCULAR | Status: DC | PRN
  Administered 2022-10-11 (×2): 50 ug via INTRAVENOUS

## 2022-10-11 MED ORDER — TRAMADOL HCL 50 MG PO TABS: 50.0000 mg | ORAL_TABLET | Freq: Four times a day (QID) | ORAL | 0 refills | Status: DC | PRN

## 2022-10-11 MED ORDER — LIDOCAINE-EPINEPHRINE 1 %-1:100000 IJ SOLN
INTRAMUSCULAR | Status: AC
  Filled 2022-10-11: qty 1

## 2022-10-11 MED ORDER — DOCUSATE SODIUM 100 MG PO CAPS: 100.0000 mg | ORAL_CAPSULE | Freq: Two times a day (BID) | ORAL | 0 refills | Status: AC | PRN

## 2022-10-11 MED ORDER — ACETAMINOPHEN 325 MG PO TABS: 650.0000 mg | ORAL_TABLET | Freq: Three times a day (TID) | ORAL | 0 refills | Status: AC | PRN

## 2022-10-11 MED ORDER — OXYCODONE HCL 5 MG/5ML PO SOLN: 5.0000 mg | Freq: Once | ORAL | Status: DC | PRN

## 2022-10-11 MED ORDER — PHENYLEPHRINE HCL (PRESSORS) 10 MG/ML IV SOLN
INTRAVENOUS | Status: DC | PRN
  Administered 2022-10-11 (×3): 160 ug via INTRAVENOUS

## 2022-10-11 SURGICAL SUPPLY — 35 items
ADH SKN CLS APL DERMABOND .7 (GAUZE/BANDAGES/DRESSINGS) ×1
APL PRP STRL LF DISP 70% ISPRP (MISCELLANEOUS) ×1
BLADE SURG 15 STRL LF DISP TIS (BLADE) ×2 IMPLANT
BLADE SURG 15 STRL SS (BLADE) ×1
CHLORAPREP W/TINT 26 (MISCELLANEOUS) ×2 IMPLANT
DERMABOND ADVANCED .7 DNX12 (GAUZE/BANDAGES/DRESSINGS) ×2 IMPLANT
DRAPE LAPAROTOMY 100X77 ABD (DRAPES) ×1 IMPLANT
DRAPE SHEET LG 3/4 BI-LAMINATE (DRAPES) ×2 IMPLANT
ELECT CAUTERY BLADE 6.4 (BLADE) ×2 IMPLANT
ELECT REM PT RETURN 9FT ADLT (ELECTROSURGICAL) ×1
ELECTRODE REM PT RTRN 9FT ADLT (ELECTROSURGICAL) ×2 IMPLANT
GAUZE 4X4 16PLY ~~LOC~~+RFID DBL (SPONGE) ×1 IMPLANT
GLOVE BIOGEL PI IND STRL 7.0 (GLOVE) ×2 IMPLANT
GLOVE SURG SYN 6.5 ES PF (GLOVE) ×2 IMPLANT
GLOVE SURG SYN 6.5 PF PI (GLOVE) ×1 IMPLANT
GOWN STRL REUS W/ TWL LRG LVL3 (GOWN DISPOSABLE) ×4 IMPLANT
GOWN STRL REUS W/TWL LRG LVL3 (GOWN DISPOSABLE) ×3
KIT TURNOVER KIT A (KITS) ×2 IMPLANT
LABEL OR SOLS (LABEL) ×2 IMPLANT
MANIFOLD NEPTUNE II (INSTRUMENTS) ×2 IMPLANT
NDL HYPO 22X1.5 SAFETY MO (MISCELLANEOUS) ×2 IMPLANT
NEEDLE HYPO 22X1.5 SAFETY MO (MISCELLANEOUS) ×1 IMPLANT
NS IRRIG 500ML POUR BTL (IV SOLUTION) IMPLANT
PACK BASIN MINOR ARMC (MISCELLANEOUS) ×2 IMPLANT
SUT ETHILON 3-0 FS-10 30 BLK (SUTURE) ×1
SUT MNCRL 4-0 (SUTURE) ×1
SUT MNCRL 4-0 27XMFL (SUTURE) ×1
SUT SILK 2 0 SH (SUTURE) IMPLANT
SUT VIC AB 3-0 SH 27 (SUTURE) ×1
SUT VIC AB 3-0 SH 27X BRD (SUTURE) ×2 IMPLANT
SUTURE EHLN 3-0 FS-10 30 BLK (SUTURE) IMPLANT
SUTURE MNCRL 4-0 27XMF (SUTURE) ×1 IMPLANT
SYR 30ML LL (SYRINGE) ×2 IMPLANT
TOWEL OR 17X26 4PK STRL BLUE (TOWEL DISPOSABLE) ×1 IMPLANT
TRAP FLUID SMOKE EVACUATOR (MISCELLANEOUS) ×2 IMPLANT

## 2022-10-11 NOTE — H&P (Signed)
 Subjective:   CC: Chest wall mass [R22.2]  HPI:  Caleb Wall is a 67 y.o. male who was referred by Jasmine December Fitzgeral* for evaluation of above. First noted several years ago.  Growing slowly and more uncomfortable.   Past Medical History:  has a past medical history of Diabetes (CMS/HHS-HCC) (04/23/2013), Hematologic abnormality (03/07/2020), History of hepatitis C (01/25/2021), Hypertension, and Other hyperlipidemia (01/21/2020).  Past Surgical History:  has a past surgical history that includes Eye surgery (Left); arthroplasty hip total (Left, 03/06/2020); open reduction femoral neck fracture (Left, 03/06/2020); Fracture surgery (03/06/2020); cystourethroscopy w/ureteral cathization w/wo retrograde pyelogram (Right, 12/13/2020); cystourethroscopy w/ureteroscopy &/or pyeloscopy w/lithotripsy (Right, 12/13/2020); ureterogram retrograde w/wo kub (Right, 12/13/2020); cystourethroscopy w/ureteroscopy &/or pyeloscopy (Right, 12/13/2020); C3-6 ANTERIOR CERVICAL DECOMPRESSION/DISCECTOMY FUSION (12/26/2020); and Colon @ [facility] (09/29/2021).  Family History: family history includes High blood pressure (Hypertension) in his father.  Social History:  reports that he quit smoking about 3 years ago. His smoking use included cigarettes. He started smoking about 43 years ago. He has a 30 pack-year smoking history. He has never used smokeless tobacco. He reports that he does not currently use alcohol. He reports that he does not use drugs.  Current Medications: has a current medication list which includes the following prescription(s): albuterol mdi (proventil, ventolin, proair) hfa, albuterol mdi (proventil, ventolin, proair) hfa, diclofenac, latanoprost, meloxicam, sildenafil, tamsulosin, tramadol, trulicity, brimonidine, dorzolamide-timolol, and dulaglutide.  Allergies:  No Known Allergies  ROS:  A 15 point review of systems was performed and pertinent positives and negatives noted in HPI   Objective:      BP (!) 157/99   Pulse 81   Ht 168.9 cm (5' 6.5")   Wt (!) 107 kg (235 lb 14.3 oz)   BMI 37.51 kg/m   Constitutional :  No distress, cooperative, alert  Lymphatics/Throat:  Supple with no lymphadenopathy  Respiratory:  Clear to auscultation bilaterally  Cardiovascular:  Regular rate and rhythm  Gastrointestinal: Soft, non-tender, non-distended, no organomegaly.  Musculoskeletal: Steady gait and movement  Skin: Cool and moist, 10cm x 10cm ish left anterior chest wall, near shoulder  Psychiatric: Normal affect, non-agitated, not confused         LABS:  N/a   RADS: N/a  Assessment:      Chest wall mass [R22.2]- will proceed with excision in OR due to size.  Plan:     1. Chest wall mass [R22.2] Discussed surgical excision.  Alternatives include continued observation.  Benefits include possible symptom relief, pathologic evaluation, improved cosmesis. Discussed the risk of surgery including recurrence, chronic pain, post-op infxn, poor cosmesis, poor/delayed wound healing, and possible re-operation to address said risks. The risks of general anesthetic, if used, includes MI, CVA, sudden death or even reaction to anesthetic medications also discussed.  Typical post-op recovery time of 3-5 days with possible activity restrictions were also discussed.  The patient verbalized understanding and all questions were answered to the patient's satisfaction.  2. Patient has elected to proceed with surgical treatment. Procedure will be scheduled. Supine position  labs/images/medications/previous chart entries reviewed personally and relevant changes/updates noted above.

## 2022-10-11 NOTE — Op Note (Signed)
Pre-Op Dx: lipoma, chest Post-Op Dx: same Anesthesia: GETA EBL: Complications:  none apparent Specimen: lipoma, chest Procedure: excisional biopsy of lipoma, chest Surgeon: Tonna Boehringer  Indications for procedure: See H&P  Description of Procedure:  Consent obtained, time out performed.  Patient placed in supine position.  Area sterilized and draped in usual position.  Local infused to area previously marked.  8cm incision made through dermis with 15blade and lipoma noted in subcutaneous layer.  The  18cm x 16.5cm x 17cm lipoma then removed from surrounding tissue completely using electrocautery, passed off field pending pathology.  Wound hemostasis noted, then closed in two layer fashion with 3-0 vicryl in interrupted fashion for deep dermal layer, then running 4-0 monocryl in subcuticular fashion for epidermal layer.  Wound then dressed with dermabond.  Pt tolerated procedure well, and transferred to PACU in stable condition. Sponge and instrument count correct at end of procedure.

## 2022-10-11 NOTE — Anesthesia Preprocedure Evaluation (Signed)
Anesthesia Evaluation  Patient identified by MRN, date of birth, ID band Patient awake    Reviewed: Allergy & Precautions, NPO status , Patient's Chart, lab work & pertinent test results  History of Anesthesia Complications Negative for: history of anesthetic complications  Airway Mallampati: III  TM Distance: >3 FB Neck ROM: full    Dental  (+) Dental Advidsory Given, Teeth Intact   Pulmonary neg pulmonary ROS, former smoker   Pulmonary exam normal        Cardiovascular hypertension, (-) angina (-) Past MI, (-) Cardiac Stents and (-) CABG negative cardio ROS Normal cardiovascular exam     Neuro/Psych negative neurological ROS  negative psych ROS   GI/Hepatic negative GI ROS,,,(+) Hepatitis -, C  Endo/Other  negative endocrine ROSdiabetes    Renal/GU      Musculoskeletal   Abdominal   Peds  Hematology negative hematology ROS (+)   Anesthesia Other Findings Past Medical History: No date: Allergy     Comment:  Seasonal No date: BPH (benign prostatic hyperplasia) No date: Cervical myelopathy (HCC) 09/2022: Chest wall mass No date: Diabetes mellitus without complication (HCC) No date: Hepatitis C No date: History of kidney stones No date: Hyperlipidemia No date: Hypertension No date: Prediabetes No date: Primary open angle glaucoma (POAG) of left eye No date: Sepsis secondary to UTI [facility])  Past Surgical History: 12/26/2020: ANTERIOR CERVICAL DECOMPRESSION/DISCECTOMY FUSION 4  LEVELS; N/A     Comment:  Procedure: C3-6 ANTERIOR CERVICAL               DECOMPRESSION/DISCECTOMY FUSION 3 LEVELS;  Surgeon: Lucy Chris, MD;  Location: [facility] ORS;  Service: Neurosurgery;               Laterality: N/A; 12/21/2019: BROW LIFT AND BLEPHAROPLASTY; Bilateral 02/05/2019: CATARACT EXTRACTION W/ INTRAOCULAR LENS IMPLANT; Left 09/29/2021: COLONOSCOPY WITH PROPOFOL; N/A     Comment:  Procedure: COLONOSCOPY WITH  PROPOFOL;  Surgeon:               Regis Bill, MD;  Location: [facility] ENDOSCOPY;                Service: Endoscopy;  Laterality: N/A; 2002: KNEE SURGERY; Left     Comment:  Meniscus tear 12/13/2020: LITHOTRIPSY 03/06/2020: ORIF FEMORAL NECK FRACTURE W/ DHS; Left 07/25/2021: SHOULDER ARTHROSCOPY WITH OPEN ROTATOR CUFF REPAIR AND  DISTAL CLAVICLE ACROMINECTOMY; Left     Comment:  Procedure: SHOULDER ARTHROSCOPY WITH OPEN ROTATOR CUFF               REPAIR AND DISTAL CLAVICLE ACROMINECTOMY;  Surgeon:               Juanell Fairly, MD;  Location: [facility] ORS;  Service:               Orthopedics;  Laterality: Left; 03/06/2020: TOTAL HIP ARTHROPLASTY; Left  BMI    Body Mass Index: 35.87 kg/m      Reproductive/Obstetrics negative OB ROS                             Anesthesia Physical Anesthesia Plan  ASA: 3  Anesthesia Plan: General   Post-op Pain Management:    Induction: Intravenous  PONV Risk Score and Plan: 2 and Midazolam, Dexamethasone and Ondansetron  Airway Management Planned: LMA  Additional Equipment:   Intra-op Plan:   Post-operative Plan: Extubation in  OR  Informed Consent: I have reviewed the patients History and Physical, chart, labs and discussed the procedure including the risks, benefits and alternatives for the proposed anesthesia with the patient or authorized representative who has indicated his/her understanding and acceptance.     Dental Advisory Given  Plan Discussed with: Anesthesiologist, CRNA and Surgeon  Anesthesia Plan Comments: (Patient consented for risks of anesthesia including but not limited to:  - adverse reactions to medications - damage to eyes, teeth, lips or other oral mucosa - nerve damage due to positioning  - sore throat or hoarseness - Damage to heart, brain, nerves, lungs, other parts of body or loss of life  Patient voiced understanding.)       Anesthesia Quick Evaluation

## 2022-10-11 NOTE — Anesthesia Postprocedure Evaluation (Signed)
Anesthesia Post Note  Patient: Caleb Wall  Procedure(s) Performed: EXCISION MASS CHEST WALL (Chest)  Patient location during evaluation: PACU Anesthesia Type: General Level of consciousness: awake and alert Pain management: pain level controlled Vital Signs Assessment: post-procedure vital signs reviewed and stable Respiratory status: spontaneous breathing, nonlabored ventilation, respiratory function stable and patient connected to nasal cannula oxygen Cardiovascular status: blood pressure returned to baseline and stable Postop Assessment: no apparent nausea or vomiting Anesthetic complications: no  No notable events documented.   Last Vitals:  Vitals:   10/11/22 0900 10/11/22 0918  BP: (!) 161/103 (!) 156/95  Pulse: 79 76  Resp: (!) 21 16  Temp: (!) 36.3 C (!) 36.1 C  SpO2: 98% 94%    Last Pain:  Vitals:   10/11/22 0918  TempSrc: Axillary  PainSc: 2                  Stephanie Coup

## 2022-10-11 NOTE — Discharge Instructions (Addendum)
Removal, Care After This sheet gives you information about how to care for yourself after your procedure. Your health care provider may also give you more specific instructions. If you have problems or questions, contact your health care provider. What can I expect after the procedure? After the procedure, it is common to have: Soreness. Bruising. Itching. Follow these instructions at home: site care Follow instructions from your health care provider about how to take care of your site. Make sure you: Wash your hands with soap and water before and after you change your bandage (dressing). If soap and water are not available, use hand sanitizer. Leave stitches (sutures), skin glue, or adhesive strips in place. These skin closures may need to stay in place for 2 weeks or longer. If adhesive strip edges start to loosen and curl up, you may trim the loose edges. Do not remove adhesive strips completely unless your health care provider tells you to do that. If the area bleeds or bruises, apply gentle pressure for 10 minutes. OK TO SHOWER IN 24HRS  Check your site every day for signs of infection. Check for: Redness, swelling, or pain. Fluid or blood. Warmth. Pus or a bad smell.  General instructions Rest and then return to your normal activities as told by your health care provider.  tylenol  as needed for discomfort.   Use narcotics, if prescribed, only when tylenol  is not enough to control pain.  325-'650mg'$  every 8hrs to max of '3000mg'$ /24hrs (including the '325mg'$  in every norco dose) for the tylenol.    Keep all follow-up visits as told by your health care provider. This is important. Contact a health care provider if: You have redness, swelling, or pain around your site. You have fluid or blood coming from your site. Your site feels warm to the touch. You have pus or a bad smell coming from your site. You have a fever. Your sutures, skin glue, or adhesive strips loosen or come off sooner  than expected. Get help right away if: You have bleeding that does not stop with pressure or a dressing. Summary After the procedure, it is common to have some soreness, bruising, and itching at the site. Follow instructions from your health care provider about how to take care of your site. Check your site every day for signs of infection. Contact a health care provider if you have redness, swelling, or pain around your site, or your site feels warm to the touch. Keep all follow-up visits as told by your health care provider. This is important. This information is not intended to replace advice given to you by your health care provider. Make sure you discuss any questions you have with your health care provider. Document Released: 03/04/2015 Document Revised: 08/05/2017 Document Reviewed: 08/05/2017 Elsevier Interactive Patient Education  2019 [employer]   The drugs that you were given will stay in your system until tomorrow so for the next 24 hours you should not:  Drive an automobile Make any legal decisions Drink any alcoholic beverage   You may resume regular meals tomorrow.  Today it is better to start with liquids and gradually work up to solid foods.  You may eat anything you prefer, but it is better to start with liquids, then soup and crackers, and gradually work up to solid foods.   Please notify your doctor immediately if you have any unusual bleeding, trouble breathing, redness and pain at the surgery site, drainage, fever, or  pain not relieved by medication.    Additional Instructions:        Please contact your physician with any problems or Same Day Surgery at [phone], Monday through Friday 6 am to 4 pm, or [place] at [facility] number at [phone].

## 2022-10-11 NOTE — Interval H&P Note (Signed)
History and Physical Interval Note:  10/11/2022 7:18 AM  Caleb Wall  has presented today for surgery, with the diagnosis of chest wall mass R22.2.  The various methods of treatment have been discussed with the patient and family. After consideration of risks, benefits and other options for treatment, the patient has consented to  Procedure(s): EXCISION MASS CHEST WALL (N/A) as a surgical intervention.  The patient's history has been reviewed, patient examined, no change in status, stable for surgery.  I have reviewed the patient's chart and labs.  Questions were answered to the patient's satisfaction.     Devonta Blanford Tonna Boehringer

## 2022-10-11 NOTE — Anesthesia Procedure Notes (Signed)
Procedure Name: Intubation Date/Time: 10/11/2022 7:44 AM  Performed by: Merlene Pulling, CRNAPre-anesthesia Checklist: Patient identified, Patient being monitored, Timeout performed, Emergency Drugs available and Suction available Patient Re-evaluated:Patient Re-evaluated prior to induction Oxygen Delivery Method: Circle system utilized Preoxygenation: Pre-oxygenation with 100% oxygen Induction Type: IV induction Ventilation: Mask ventilation without difficulty Laryngoscope Size: 3 and McGraph Grade View: Grade I Tube type: Oral Tube size: 7.5 mm Number of attempts: 1 Airway Equipment and Method: Stylet Placement Confirmation: ETT inserted through vocal cords under direct vision, positive ETCO2 and breath sounds checked- equal and bilateral Secured at: 22 cm Tube secured with: Tape Dental Injury: Teeth and Oropharynx as per pre-operative assessment

## 2022-10-11 NOTE — Transfer of Care (Signed)
Immediate Anesthesia Transfer of Care Note  Patient: Caleb Wall  Procedure(s) Performed: EXCISION MASS CHEST WALL (Chest)  Patient Location: PACU  Anesthesia Type:General  Level of Consciousness: drowsy  Airway & Oxygen Therapy: Patient Spontanous Breathing and Patient connected to face mask oxygen  Post-op Assessment: Report given to RN, Post -op Vital signs reviewed and stable, and Post -op Vital signs reviewed and unstable, Anesthesiologist notified  Post vital signs: Reviewed  Last Vitals:  Vitals Value Taken Time  BP 176/102 10/11/22 0830  Temp 45F   Pulse 79 10/11/22 0833  Resp 14 10/11/22 0833  SpO2 98 % 10/11/22 0833  Vitals shown include unfiled device data.  Last Pain:  Vitals:   10/11/22 0619  TempSrc: Oral  PainSc: 2          Complications: No notable events documented.

## 2022-10-15 ENCOUNTER — Other Ambulatory Visit: Payer: 59

## 2022-10-15 LAB — SURGICAL PATHOLOGY

## 2022-10-19 ENCOUNTER — Encounter: Payer: Self-pay | Admitting: Urology

## 2022-10-19 NOTE — Progress Notes (Unsigned)
Referring Physician:  No referring provider defined for this encounter.  Primary Physician:  [facility]  History of Present Illness: 10/19/2022 Caleb Wall has a history of HTN, hyperlipidemia, Hep C, glaucoma. History of cocaine use.   History of ACDF C3-C7 for myelopathy by Dr. Adriana Simas on 12/26/20. He had some residual weakness and numbness postop.   Last seen by me on 09/13/22 for neck and LBP. Previous cervical xrays show ACDF C3-C6.    He has been seeing PMR for his lumbar spine. Per Whitney's last note, EMG nerve conduction study performed on 09/28/2020 by Dr. Theodis Aguas revealed chronic right L4 radiculopathy upper motor neuron signs were also noted on exam.   MRI of his cervical and lumbar spine ordered and he is here to review them.   Signs/symptoms of cervical myelopathy?***   3 week history of constant neck pain with radiation to left elbow along with limited ROM of his neck. He's had numbness/tingling on right since surgery, but is now on left.    Previous cervical xrays show ACDF C3-C6.    History of chronic intermittent LBP. He notes weakness in right leg that started 3 weeks ago as well. It is improving. He has numbness and tingling as well. No leg pain.      He has 3 week history of constant neck pain with radiation into left arm to his elbow. He has minimal ROM of his neck due to pain. No known injury. He has numbness and tingling in right hand and some in left arm as well. Right side was worse prior to his cervical surgery. He has weakness in right arm that is new. He has weakness and numbness in right leg as well that is also new in last 3 weeks- it is improving. No pain in right leg. He continues with intermittent LBP.   Bowel/Bladder Dysfunction: none  Conservative measures:  Physical therapy: none Multimodal medical therapy including regular antiinflammatories: tylenol, ultram, voltaren gel, mobic Injections:  05/16/2022: Bilateral S1 transforaminal  ESI (50% relief) 11/22/2021: Right L5-S1 transforaminal ESI (75% relief) 07/04/2021: Left sacroiliac joint injection (no relief) 03/20/2021: Left sacroiliac joint injection (70% relief) 02/15/2020: Left SI joint injection at [facility] (good relief) 12/30/2019: Left L4-5 transforaminal ESI performed at [facility]   Past Surgery:  History of ACDF C3-C7 for myelopathy by Dr. Adriana Simas on 12/26/20  [name] has no symptoms of cervical myelopathy.  The symptoms are causing a significant impact on the patient's life.   Review of Systems:  A 10 point review of systems is negative, except for the pertinent positives and negatives detailed in the HPI.  Past Medical History: Past Medical History:  Diagnosis Date   Allergy    Seasonal   BPH (benign prostatic hyperplasia)    Cervical myelopathy (HCC)    Chest wall mass 09/2022   Diabetes mellitus without complication (HCC)    Hepatitis C    History of kidney stones    Hyperlipidemia    Hypertension    Prediabetes    Primary open angle glaucoma (POAG) of left eye    Sepsis secondary to UTI [facility])     Past Surgical History: Past Surgical History:  Procedure Laterality Date   ANTERIOR CERVICAL DECOMPRESSION/DISCECTOMY FUSION 4 LEVELS N/A 12/26/2020   Procedure: C3-6 ANTERIOR CERVICAL DECOMPRESSION/DISCECTOMY FUSION 3 LEVELS;  Surgeon: Lucy Chris, MD;  Location: [facility] ORS;  Service: Neurosurgery;  Laterality: N/A;   BROW LIFT AND BLEPHAROPLASTY Bilateral 12/21/2019   CATARACT EXTRACTION W/ INTRAOCULAR  LENS IMPLANT Left 02/05/2019   COLONOSCOPY WITH PROPOFOL N/A 09/29/2021   Procedure: COLONOSCOPY WITH PROPOFOL;  Surgeon: Regis Bill, MD;  Location: [facility] ENDOSCOPY;  Service: Endoscopy;  Laterality: N/A;   KNEE SURGERY Left 2002   Meniscus tear   LITHOTRIPSY  12/13/2020   MASS EXCISION N/A 10/11/2022   Procedure: EXCISION MASS CHEST WALL;  Surgeon: Sung Amabile, DO;  Location: [facility] ORS;  Service: General;  Laterality: N/A;   ORIF FEMORAL  NECK FRACTURE W/ DHS Left 03/06/2020   SHOULDER ARTHROSCOPY WITH OPEN ROTATOR CUFF REPAIR AND DISTAL CLAVICLE ACROMINECTOMY Left 07/25/2021   Procedure: SHOULDER ARTHROSCOPY WITH OPEN ROTATOR CUFF REPAIR AND DISTAL CLAVICLE ACROMINECTOMY;  Surgeon: Juanell Fairly, MD;  Location: [facility] ORS;  Service: Orthopedics;  Laterality: Left;   TOTAL HIP ARTHROPLASTY Left 03/06/2020    Allergies: Allergies as of 10/24/2022   (No Known Allergies)    Medications: Outpatient Encounter Medications as of 10/24/2022  Medication Sig   acetaminophen (TYLENOL) 325 MG tablet Take 2 tablets (650 mg total) by mouth every 8 (eight) hours as needed for mild pain.   alfuzosin (UROXATRAL) 10 MG 24 hr tablet Take 10 mg by mouth at bedtime.   brimonidine (ALPHAGAN) 0.2 % ophthalmic solution Place 1 drop into both eyes 2 (two) times daily.   docusate sodium (COLACE) 100 MG capsule Take 1 capsule (100 mg total) by mouth 2 (two) times daily as needed for up to 10 days for mild constipation.   dorzolamide-timolol (COSOPT) 22.3-6.8 MG/ML ophthalmic solution Place 1 drop into both eyes 2 (two) times daily.   Dulaglutide (TRULICITY) 0.75 MG/0.5ML SOPN Inject 0.75 mg into the skin once a week. Wednesday   dutasteride (AVODART) 0.5 MG capsule Take 1 capsule (0.5 mg total) by mouth daily.   latanoprost (XALATAN) 0.005 % ophthalmic solution Place 1 drop into both eyes at bedtime.   lisinopril (ZESTRIL) 40 MG tablet Take 40 mg by mouth daily.   traMADol (ULTRAM) 50 MG tablet Take 1 tablet (50 mg total) by mouth every 6 (six) hours as needed.   No facility-administered encounter medications on file as of 10/24/2022.    Social History: Social History   Tobacco Use   Smoking status: Former    Current packs/day: 0.00    Average packs/day: 0.5 packs/day for 40.0 years (20.0 ttl pk-yrs)    Types: Cigarettes    Start date: 07/1979    Quit date: 07/2019    Years since quitting: 3.2   Smokeless tobacco: Never  Vaping Use    Vaping status: Never Used  Substance Use Topics   Alcohol use: No   Drug use: Not Currently    Types: Marijuana    Comment: History of cocaine use.     Family Medical History: Family History  Problem Relation Age of Onset   Heart disease Mother    Hypertension Mother    Diabetes Maternal Aunt    Cancer Maternal Uncle        Prostate   Diabetes Paternal Aunt     Physical Examination: There were no vitals filed for this visit.    Awake, alert, oriented to person, place, and time.  Speech is clear and fluent. Fund of knowledge is appropriate.   Cranial Nerves: Pupils equal round and reactive to light.  Facial tone is symmetric.    Well healed cervical incision.   No lower lumbar tenderness.   No abnormal lesions on exposed skin.   Strength: Side Biceps Triceps Deltoid Interossei Grip Wrist Ext.  Wrist Flex.  R 5 5 5 5 5 5 5   L 5 5 5 5 5 5 5    Side Iliopsoas Quads Hamstring PF DF EHL  R 4+ 5 5 5 5 5   L 5 5 5 5 5 5    Reflexes are 2+ and symmetric at the biceps, brachioradialis, patella and achilles.   Hoffman's is absent.   Bilateral upper and lower extremity sensation is intact to light touch.     Gait is slow. He uses a cane.   Medical Decision Making  Imaging: MRI cervical and lumbar spine dated 10/01/22:  FINDINGS: MRI CERVICAL SPINE FINDINGS   Alignment: Straightening without focal angulation or significant listhesis.   Vertebrae: As shown on the most recent radiographs, the patient has undergone C3-6 ACDF since the previous MRI and CT. Allowing for the artifact associated with the hardware, no evidence of acute fracture or traumatic subluxation. 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   Cord: There is chronic multilevel cord flattening, detailed at the individual levels below. Chronic cord atrophy and T2 hyperintensity at C3-4 consistent with myelomalacia, similar to previous MRI. No acute cord findings identified.   Posterior Fossa,  vertebral arteries, paraspinal tissues: Visualized portions of the posterior fossa appear unremarkable.Bilateral vertebral artery flow voids. No significant paraspinal findings.   Disc levels:   C2-3: Progressive spondylosis with loss of disc height and an asymmetric left foraminal disc osteophyte complex. Chronic left greater than right foraminal narrowing. Interval increased spinal stenosis with increased left-sided cord flattening. Chronic severe left and mild right foraminal narrowing.   C3-4: Interval ACDF with moderate residual spinal stenosis and foraminal narrowing bilaterally. Underlying chronic myelomalacia as described above.   C4-5: Interval ACDF with improved patency of the spinal canal and foramina bilaterally. Mild foraminal narrowing bilaterally. No significant residual spinal stenosis.   C5-6: Interval ACDF with improved patency of the spinal canal and foramina bilaterally. Mild foraminal narrowing bilaterally. No significant residual spinal stenosis.   C6-7: Chronic spondylosis with loss of disc height, disc bulging and endplate osteophytes asymmetric to the right. Mild right-sided cord flattening and moderate to severe foraminal narrowing bilaterally are similar to previous MRI.   C7-T1: Chronic disc bulging with bilateral uncinate spurring and facet hypertrophy. No significant central spinal stenosis. Moderate right-greater-than-left foraminal narrowing appears similar to the previous MRI.   Images demonstrate grossly stable spondylosis at T1-2 and T2-3 without resulting cord deformity or high-grade foraminal narrowing.   MRI LUMBAR SPINE FINDINGS   Segmentation: Conventional anatomy assumed, with the last open disc space designated L5-S1.Appears concordant with previous imaging.   Alignment:  Minimal degenerative anterolisthesis at L3-4 and L4-5.   Vertebrae: No worrisome osseous lesion, acute fracture or pars defect. The lumbar pedicles are somewhat  short on a congenital basis. The visualized sacroiliac joints appear unremarkable.   Conus medullaris: Extends to the L1-2 level and appears normal.   Paraspinal and other soft tissues: No significant paraspinal findings.   Disc levels:   Sagittal images demonstrate disc bulging and endplate osteophytes at T11-12 with resulting mild left foraminal narrowing. No cord deformity. The T12-L1 level appears unremarkable.   L1-2: Preserved disc height and hydration. Mild facet hypertrophy. No spinal stenosis or nerve root encroachment.   L2-3: Preserved disc height with mild disc bulging eccentric to the left and mild to moderate facet and ligamentous hypertrophy. Mild left foraminal narrowing without definite nerve root encroachment.   L3-4: Mildly progressive loss of disc height with annular disc bulging  and a broad-based right foraminal disc protrusion. Moderate facet and ligamentous hypertrophy. Resulting mild to moderate multifactorial spinal stenosis which appears mildly progressive. Moderate right foraminal narrowing also appears progressive with possible right L3 nerve root encroachment. Stable mild narrowing of the left foramen.   L4-5: Chronic loss of disc height with annular disc bulging and endplate osteophytes, mildly progressive from previous MRI. Advanced facet and ligamentous hypertrophy. Resulting progressive mild-to-moderate multifactorial spinal stenosis with moderate foraminal narrowing bilaterally.   L5-S1: Relatively preserved disc height with mild disc bulging and asymmetric left-sided facet hypertrophy. The spinal canal and lateral recesses are patent. Mild foraminal narrowing bilaterally.   IMPRESSION: 1. Compared with outside MRI from 10/21/2020, the patient has undergone C3-6 ACDF. There is improved patency of the spinal canal and foramina at the operative levels. 2. Chronic myelomalacia at C3-4 without acute cord findings. 3. Progressive spondylosis at  C2-3 with resulting increased spinal stenosis and left-sided cord flattening. Chronic left-greater-than-right foraminal narrowing. 4. Chronic spondylosis at C6-7 with resulting mild right-sided cord flattening and moderate to severe foraminal narrowing bilaterally, similar to previous MRI. 5. Mildly progressive spondylosis at L3-4 and L4-5. At L3-4, there is a broad-based right foraminal disc protrusion which may contribute to right L3 nerve root encroachment. At L4-5, there is progressive mild-to-moderate multifactorial spinal stenosis and moderate foraminal narrowing bilaterally. 6. No acute findings identified.     Electronically Signed   By: Carey Bullocks M.D.   On: 10/14/2022 16:39  I have personally reviewed the images and agree with the above interpretation.  Assessment and Plan: Mr. Feightner is a pleasant 67 y.o. male has history of ACDF C3-C7 for myelopathy by Dr. Adriana Simas on 12/26/20.   3 week history of constant neck pain with radiation to left elbow along with limited ROM of his neck. He's had numbness/tingling on right since surgery, but is now on left.   Previous cervical xrays show ACDF C3-C6.   History of chronic intermittent LBP. He notes weakness in right leg that started 3 weeks ago as well. It is improving. He has numbness and tingling as well. No leg pain.   He has been seeing PMR for his lumbar spine. Per Whitney's last note, EMG nerve conduction study performed on 09/28/2020 by Dr. Theodis Aguas revealed chronic right L4 radiculopathy upper motor neuron signs were also noted on exam.   Treatment options discussed with patient and following plan made:   - MRI of cervical spine to evaluate cervical radiculopathy.  - MRI of lumbar spine to evaluate left leg weakness.  - Depending on results of MRI , may consider PT and/or injections.  - He asks about pain medication. He is on ultram from PMR. He will discuss further with them.  - Discussed red flag symptoms. Go to ED if  worsening weakness or any bowel/bladder issues.  - Will schedule follow up with me to review MRI results once I get them back.   BP was elevated. He states is is always up at doctors. No symptoms of chest pain, shortness of breath, blurry vision, or headaches. He checks BP at home and it generally runs normal. Will recheck at home and call PCP if not improved. If he develops CP, SOB, blurry vision, or headaches, then he will go to ED.     I spent a total of 35 minutes in face-to-face and non-face-to-face activities related to this patient's care today including review of outside records, review of imaging, review of symptoms, physical exam, discussion of differential diagnosis,  discussion of treatment options, and documentation.   Thank you for involving me in the care of this patient.   Drake Leach PA-C Dept. of Neurosurgery

## 2022-10-19 NOTE — Telephone Encounter (Signed)
Spoke with patient and advised that we are short staffted and that Sam is not in office. Pt symptoms are his bladder hurt and no energy. Pt stated he will go to urgent care. Pt still states that he was told by Sam that he can drop off urine anytime he needs to. I again explained we do not do urine drop-off and that's not appropriate care.

## 2022-10-24 ENCOUNTER — Encounter: Payer: Self-pay | Admitting: Orthopedic Surgery

## 2022-10-24 ENCOUNTER — Ambulatory Visit: Payer: 59 | Admitting: Orthopedic Surgery

## 2022-10-24 VITALS — BP 136/94 | Ht 67.0 in | Wt 238.0 lb

## 2022-10-24 DIAGNOSIS — M47816 Spondylosis without myelopathy or radiculopathy, lumbar region: Secondary | ICD-10-CM

## 2022-10-24 DIAGNOSIS — M4802 Spinal stenosis, cervical region: Secondary | ICD-10-CM | POA: Diagnosis not present

## 2022-10-24 DIAGNOSIS — Z981 Arthrodesis status: Secondary | ICD-10-CM

## 2022-10-24 DIAGNOSIS — G9589 Other specified diseases of spinal cord: Secondary | ICD-10-CM | POA: Diagnosis not present

## 2022-10-24 DIAGNOSIS — M48061 Spinal stenosis, lumbar region without neurogenic claudication: Secondary | ICD-10-CM | POA: Diagnosis not present

## 2022-10-24 DIAGNOSIS — M47812 Spondylosis without myelopathy or radiculopathy, cervical region: Secondary | ICD-10-CM

## 2022-10-25 ENCOUNTER — Other Ambulatory Visit: Payer: Self-pay | Admitting: *Deleted

## 2022-10-25 MED ORDER — ALFUZOSIN HCL ER 10 MG PO TB24: 10.0000 mg | ORAL_TABLET | Freq: Every day | ORAL | 1 refills | Status: DC

## 2022-11-01 ENCOUNTER — Emergency Department
Admission: EM | Admit: 2022-11-01 | Discharge: 2022-11-01 | Disposition: A | Discharge status: Home / Self Care | Payer: Medicare HMO | Attending: Emergency Medicine | Admitting: Emergency Medicine

## 2022-11-01 ENCOUNTER — Other Ambulatory Visit: Payer: Self-pay

## 2022-11-01 DIAGNOSIS — E119 Type 2 diabetes mellitus without complications: Secondary | ICD-10-CM | POA: Diagnosis not present

## 2022-11-01 DIAGNOSIS — I10 Essential (primary) hypertension: Secondary | ICD-10-CM | POA: Diagnosis present

## 2022-11-01 MED ORDER — BLOOD PRESSURE MONITOR/L CUFF MISC: 1.0000 [IU] | Freq: Once | 0 refills | Status: AC

## 2022-11-01 NOTE — ED Provider Notes (Signed)
[facility] Provider Note    Event Date/Time   First MD Initiated Contact with Patient 11/01/22 1549     (approximate)   History   Hypertension   HPI  Caleb Wall is a 67 y.o. male   Past medical history of hypertension, BPH, diabetes, presents the emergency department with high blood pressure.  Sent in from clinic due to high blood pressure.  Routine visit.  He took some BC powder this morning for back pain which is chronic and unchanged.  He denies any chest pain, headache, visual changes, motor or sensory deficits, otherwise in his regular state of health.  He has been taking his blood pressure medications as prescribed.  Independent Historian contributed to assessment above: Wife at bedside corroborates information past medical history medications as above    Physical Exam   Triage Vital Signs: ED Triage Vitals  Encounter Vitals Group     BP 11/01/22 1434 (!) 159/106     Systolic BP Percentile --      Diastolic BP Percentile --      Pulse --      Resp --      Temp --      Temp src --      SpO2 --      Weight 11/01/22 1427 235 lb 14.3 oz (107 kg)     Height 11/01/22 1427 5\' 7"  (1.702 m)     Head Circumference --      Peak Flow --      Pain Score 11/01/22 1426 0     Pain Loc --      Pain Education --      Exclude from Growth Chart --     Most recent vital signs: Vitals:   11/01/22 1434  BP: (!) 159/106    General: Awake, no distress.  CV:  Good peripheral perfusion.  Resp:  Normal effort.  Abd:  No distention.  Other:  Awake alert comfortable pleasant gentleman no acute distress.  Hypertensive 150s over 100.  Regular rate and rhythm on my auscultation, clear lungs, moving all extremities full active range of motion no facial asymmetry or dysarthria.   ED Results / Procedures / Treatments   Labs (all labs ordered are listed, but only abnormal results are displayed) Labs Reviewed - No data to display  PROCEDURES:  Critical  Care performed: No  Procedures   MEDICATIONS ORDERED IN ED: Medications - No data to display  IMPRESSION / MDM / ASSESSMENT AND PLAN / ED COURSE  I reviewed the triage vital signs and the nursing notes.                                Patient's presentation is most consistent with acute presentation with potential threat to life or bodily function.  Differential diagnosis includes, but is not limited to, hypertension, hypertensive crisis, endorgan damage like ACS, stroke, kidney dysfunction, adverse effect of caffeine   MDM:    Patient with longstanding hypertension with asymptomatic hypertension today.  Advised to follow-up with PMD after checking home blood pressure medications and journal log for blood pressure management as needed.  Asymptomatic defer further workup to as I doubt endorgan damage.         FINAL CLINICAL IMPRESSION(S) / ED DIAGNOSES   Final diagnoses:  Asymptomatic hypertension  Uncontrolled hypertension     Rx / DC Orders   ED Discharge Orders  Ordered    Blood Pressure Monitoring (BLOOD PRESSURE MONITOR/L CUFF) MISC   Once        11/01/22 1608             Note:  This document was prepared using Dragon voice recognition software and may include unintentional dictation errors.    Pilar Jarvis, MD 11/01/22 [phone]

## 2022-11-01 NOTE — ED Triage Notes (Signed)
Pt sent here for hypertension from [facility] for concerns of hypertension, pt states HX of same and took his meds today. Pt denies any complaints. Pt denies headache, pt denies dizziness. NAD noted.

## 2022-11-01 NOTE — ED Notes (Signed)
Patient's wife at bedside.

## 2022-11-05 ENCOUNTER — Encounter: Payer: Self-pay | Admitting: Surgery

## 2022-12-07 NOTE — Progress Notes (Unsigned)
Referring Physician:  [facility] [address] [place],  [place] [address]  Primary Physician:  [facility]  History of Present Illness: 12/10/2022 [name] has a history of HTN, hyperlipidemia, Hep C, glaucoma. History of cocaine use.   History of ACDF C3-C7 for myelopathy by Dr. Adriana [facility] on 12/26/20. He had some residual weakness and numbness postop.   Last seen by me on 10/24/22 for neck and LBP.   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 He has central stenosis C2-C3 with left > right foraminal stenosis. Also with central stenosis C6-C7 with moderate/severe bilateral foraminal stenosis.   He also has known  lumbar spondylosis with right foraminal disc L3-L4 and central stenosis L4-L5 with moderate bilateral foraminal stenosis. Per [facility]'s last note, EMG nerve conduction study performed on 09/28/2020 by Dr. Theodis [facility] revealed chronic right L4 radiculopathy upper motor neuron signs were also noted on exam.   He saw PMR and was given ultram.  He had bilateral S1 TF ESI on 11/12/22.   He did see some relief with above injection, however he is having some back pain from his UTI.   He has intermittent LBP with intermittent weakness in right leg- it felt weak this morning. He states he's had this right leg weakness since prior to his cervical surgery. No leg pain. LBP was much better after injection until he got a UTI.   He is about the same as his last visit. He has intermittent  neck pain with radiation to left elbow. He's had numbness/tingling on right since surgery. No new dexterity or balance issues.   Bowel/Bladder Dysfunction: none  Conservative measures:  Physical therapy: none Multimodal medical therapy including regular antiinflammatories: tylenol, ultram, voltaren gel, mobic Injections:  11/12/22: Bilateral S1 TF ESI  05/16/2022: Bilateral S1 transforaminal ESI (50% relief) 11/22/2021: Right L5-S1 transforaminal ESI (75%  relief) 07/04/2021: Left sacroiliac joint injection (no relief) 03/20/2021: Left sacroiliac joint injection (70% relief) 02/15/2020: Left SI joint injection at [facility] (good relief) 12/30/2019: Left L4-5 transforaminal ESI performed at [facility]   Past Surgery:  History of ACDF C3-C7 for myelopathy by Dr. Adriana [facility] on 12/26/20  [facility] has no symptoms of cervical myelopathy.  The symptoms are causing a significant impact on the patient's life.   Review of Systems:  A 10 point review of systems is negative, except for the pertinent positives and negatives detailed in the HPI.  Past Medical History: Past Medical History:  Diagnosis Date   Allergy    Seasonal   BPH (benign prostatic hyperplasia)    Cervical myelopathy (HCC)    Chest wall mass 09/2022   Diabetes mellitus without complication (HCC)    Hepatitis C    History of kidney stones    Hyperlipidemia    Hypertension    Prediabetes    Primary open angle glaucoma (POAG) of left eye    Sepsis secondary to UTI [facility])     Past Surgical History: Past Surgical History:  Procedure Laterality Date   ANTERIOR CERVICAL DECOMPRESSION/DISCECTOMY FUSION 4 LEVELS N/A 12/26/2020   Procedure: C3-6 ANTERIOR CERVICAL DECOMPRESSION/DISCECTOMY FUSION 3 LEVELS;  Surgeon: Caleb Chris, MD;  Location: [facility];  Service: Neurosurgery;  Laterality: N/A;   BROW LIFT AND BLEPHAROPLASTY Bilateral 12/21/2019   CATARACT EXTRACTION W/ INTRAOCULAR LENS IMPLANT Left 02/05/2019   COLONOSCOPY WITH PROPOFOL N/A 09/29/2021   Procedure: COLONOSCOPY WITH PROPOFOL;  Surgeon: Caleb Bill, MD;  [facility]: [facility];  Service: [facility];  Laterality:  N/A;   KNEE SURGERY Left 2002   Meniscus tear   LITHOTRIPSY  12/13/2020   MASS EXCISION N/A 10/11/2022   Procedure: EXCISION MASS CHEST WALL;  Surgeon: Caleb Amabile, DO;  Location: [facility];  Service: General;  Laterality: N/A;   ORIF FEMORAL NECK FRACTURE W/ DHS Left 03/06/2020   SHOULDER ARTHROSCOPY WITH  OPEN ROTATOR CUFF REPAIR AND DISTAL CLAVICLE ACROMINECTOMY Left 07/25/2021   Procedure: SHOULDER ARTHROSCOPY WITH OPEN ROTATOR CUFF REPAIR AND DISTAL CLAVICLE ACROMINECTOMY;  Surgeon: Caleb Fairly, MD;  Location: [facility];  Service: Orthopedics;  Laterality: Left;   TOTAL HIP ARTHROPLASTY Left 03/06/2020    Allergies: Allergies as of 12/10/2022   (No Known Allergies)    Medications: Outpatient Encounter Medications as of 12/10/2022  Medication Sig   alfuzosin (UROXATRAL) 10 MG 24 hr tablet Take 1 tablet (10 mg total) by mouth at bedtime.   amoxicillin (AMOXIL) 500 MG capsule Take 500 mg by mouth 2 (two) times daily.   brimonidine (ALPHAGAN) 0.2 % ophthalmic solution Place 1 drop into both eyes 2 (two) times daily.   dorzolamide-timolol (COSOPT) 22.3-6.8 MG/ML ophthalmic solution Place 1 drop into both eyes 2 (two) times daily.   Dulaglutide (TRULICITY) 0.75 MG/0.5ML SOPN Inject 0.75 mg into the skin once a week. Wednesday   dutasteride (AVODART) 0.5 MG capsule Take 1 capsule (0.5 mg total) by mouth daily.   latanoprost (XALATAN) 0.005 % ophthalmic solution Place 1 drop into both eyes at bedtime.   lisinopril (ZESTRIL) 40 MG tablet Take 40 mg by mouth daily.   traMADol (ULTRAM) 50 MG tablet Take 1 tablet (50 mg total) by mouth every 6 (six) hours as needed.   No facility-administered encounter medications on file as of 12/10/2022.    Social History: Social History   Tobacco Use   Smoking status: Former    Current packs/day: 0.00    Average packs/day: 0.5 packs/day for 40.0 years (20.0 ttl pk-yrs)    Types: Cigarettes    Start date: 07/1979    Quit date: 07/2019    Years since quitting: 3.3   Smokeless tobacco: Never  Vaping Use   Vaping status: Never Used  Substance Use Topics   Alcohol use: No   Drug use: Not Currently    Types: Marijuana    Comment: History of cocaine use.     Family Medical History: Family History  Problem Relation Age of Onset   Heart disease  Mother    Hypertension Mother    Diabetes Maternal Aunt    Cancer Maternal Uncle        Prostate   Diabetes Paternal Aunt     Physical Examination: Vitals:   12/10/22 1343 12/10/22 1423  BP: (!) 152/102 (!) 138/96      Awake, alert, oriented to person, place, and time.  Speech is clear and fluent. Fund of knowledge is appropriate.   Cranial Nerves: Pupils equal round and reactive to light.  Facial tone is symmetric.    No abnormal lesions on exposed skin.   Strength: Side Biceps Triceps Deltoid Interossei Grip Wrist Ext. Wrist Flex.  R 5 5 5 5 5 5 5   L 5 5 5 5 5 5 5    Side Iliopsoas Quads Hamstring PF DF EHL  R 5 5 5 5 5 5   L 5 5 5 5 5 5    Reflexes are 2+ and symmetric at the brachioradialis, patella and achilles. Hoffman's is absent.   Bilateral upper and lower extremity sensation is intact to  light touch.     Gait is slow.   Medical Decision Making  Imaging: none   Assessment and Plan: Mr. Hayman is a pleasant 67 y.o. male has history of ACDF C3-C7 for myelopathy by Dr. Adriana [facility] on 12/26/20.   He has intermittent LBP with intermittent weakness in right leg- it felt weak this morning. He states he's had this right leg weakness since prior to his cervical surgery. No leg pain. LBP was much better after injection until he got a UTI.   Strength in right leg is 5/5 on my exam today.   He has known lumbar spondylosis with right foraminal disc L3-L4 and central stenosis L4-L5 with moderate bilateral foraminal stenosis. Per [facility]'s last note, EMG nerve conduction study performed on 09/28/2020 by Dr. Theodis [facility] revealed chronic right L4 radiculopathy upper motor neuron signs were also noted on exam.   He has intermittent  neck pain with radiation to left elbow. He's had numbness/tingling on right since surgery. No new dexterity or balance issues.    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 He has central stenosis C2-C3 with left > right foraminal stenosis. Also  with central stenosis C6-C7 with moderate/severe bilateral foraminal stenosis.  Treatment options discussed with patient and following plan made:   - Follow up with PMR next week to discuss repeat lumbar injections and possible cervical injection. Message sent to [facility] at his request. He wants to try ultram ER.  - Reviewed signs/symptoms of cervical myelopathy at length. He will call if he gets these. Cervical MRI reviewed with Dr. Katrinka Blazing previously and further surgery for neck may involve C2-T2 posterior fusion. This is not currently recommended, but would need to revisit if he becomes myelopathic.  - Follow up with me in 2-3 months and prn.   - BP was elevated. No symptoms of chest pain, shortness of breath, blurry vision, or headaches. He checks it at home and it generally 120-130s/70-80s. He will recheck at home and call PCP if not improved. If he develops CP, SOB, blurry vision, or headaches, then he will go to ED. He was advised to stop taking goody powders/BC's.   I spent a total of 30 minutes in face-to-face and non-face-to-face activities related to this patient's care today including review of outside records, review of imaging, review of symptoms, physical exam, discussion of differential diagnosis, discussion of treatment options, and documentation.   Drake Leach PA-C Dept. of Neurosurgery

## 2022-12-10 ENCOUNTER — Encounter: Payer: Self-pay | Admitting: Orthopedic Surgery

## 2022-12-10 ENCOUNTER — Ambulatory Visit (INDEPENDENT_AMBULATORY_CARE_PROVIDER_SITE_OTHER): Payer: Medicare HMO | Admitting: Orthopedic Surgery

## 2022-12-10 VITALS — BP 138/96 | Ht 67.0 in | Wt 235.0 lb

## 2022-12-10 DIAGNOSIS — Z981 Arthrodesis status: Secondary | ICD-10-CM

## 2022-12-10 DIAGNOSIS — M5412 Radiculopathy, cervical region: Secondary | ICD-10-CM

## 2022-12-10 DIAGNOSIS — M47816 Spondylosis without myelopathy or radiculopathy, lumbar region: Secondary | ICD-10-CM

## 2022-12-10 DIAGNOSIS — M4722 Other spondylosis with radiculopathy, cervical region: Secondary | ICD-10-CM | POA: Diagnosis not present

## 2022-12-10 DIAGNOSIS — M48061 Spinal stenosis, lumbar region without neurogenic claudication: Secondary | ICD-10-CM

## 2022-12-10 DIAGNOSIS — M4802 Spinal stenosis, cervical region: Secondary | ICD-10-CM

## 2022-12-10 DIAGNOSIS — M47812 Spondylosis without myelopathy or radiculopathy, cervical region: Secondary | ICD-10-CM

## 2022-12-10 NOTE — Patient Instructions (Signed)
It was so nice to see you today. Thank you so much for coming in.    Your blood pressure was elevated today. I want you to recheck it at home and follow up with your PCP if it remains high. If you have any chest pain, shortness of breath, blurry vision, or headaches then you need to go to ED.    See Whitney to discuss injections and ultram ER.   If you notice that your balance is much worse or that you dexterity is worse then let me know.   I will see you back in January.   Please do not hesitate to call if you have any questions or concerns. You can also message me in MyChart.   Drake Leach PA-C [phone]

## 2022-12-31 ENCOUNTER — Encounter: Payer: Self-pay | Admitting: Urology

## 2022-12-31 ENCOUNTER — Ambulatory Visit: Payer: Medicare HMO | Admitting: Urology

## 2022-12-31 VITALS — BP 171/108 | HR 81 | Ht 67.0 in | Wt 226.0 lb

## 2022-12-31 DIAGNOSIS — N401 Enlarged prostate with lower urinary tract symptoms: Secondary | ICD-10-CM | POA: Diagnosis not present

## 2022-12-31 DIAGNOSIS — R339 Retention of urine, unspecified: Secondary | ICD-10-CM | POA: Diagnosis not present

## 2022-12-31 DIAGNOSIS — Z87440 Personal history of urinary (tract) infections: Secondary | ICD-10-CM

## 2022-12-31 DIAGNOSIS — N390 Urinary tract infection, site not specified: Secondary | ICD-10-CM

## 2022-12-31 LAB — URINALYSIS, COMPLETE
Bilirubin, UA: NEGATIVE
Glucose, UA: NEGATIVE
Ketones, UA: NEGATIVE
Leukocytes,UA: NEGATIVE
Nitrite, UA: NEGATIVE
Protein,UA: NEGATIVE
Specific Gravity, UA: 1.02 (ref 1.005–1.030)
Urobilinogen, Ur: 0.2 mg/dL (ref 0.2–1.0)
pH, UA: 6.5 (ref 5.0–7.5)

## 2022-12-31 LAB — MICROSCOPIC EXAMINATION: RBC, Urine: >30 /[HPF] — AB (ref 0–2)

## 2022-12-31 NOTE — Progress Notes (Signed)
I, [facility], acting as a scribe for Caleb Altes, MD., have documented all relevant documentation on the behalf of Caleb Altes, MD, as directed by Caleb Altes, MD while in the presence of Caleb Altes, MD.  12/31/2022 5:39 PM   [facility] 08/16/1955 [id number]  Referring provider: [facility] [address] [place],  [place] [address]  Chief Complaint  Patient presents with   Recurrent UTI   Urologic history: 1. Recurrent UTI   2. BPH with incomplete bladder emptying Tamsulosin 0.4 mg   3. Bilateral Nephrolithiasis Non-obstructing bilateral renal calculi.  HPI: [facility] is a 67 y.o. male presents for a 4 month follow-up.  History of BPH with incomplete bladder emptying and recurrent UTI. PVR at initial presentation was 350 mL. He was placed on alfuzosin with improvement in his PVR to 253 mL. Had been on dutasteride, but unable to tolerate secondary to tiredness and has discontinued this medication. Saw Dr. Sampson [facility] 12/05/22 complaining of recurrent UTI symptoms and urine culture grew enterococcus. He was started on amoxicillin and has 3 more days of therapy. He has a follow-up appointment with Dr. Sampson [facility] next week.  He was unable to give a urine specimen today and states he has consumed 2 bottles of water, but does not have urge to go.    PMH: Past Medical History:  Diagnosis Date   Allergy    Seasonal   BPH (benign prostatic hyperplasia)    Cervical myelopathy (HCC)    Chest wall mass 09/2022   Diabetes mellitus without complication (HCC)    Hepatitis C    History of kidney stones    Hyperlipidemia    Hypertension    Prediabetes    Primary open angle glaucoma (POAG) of left eye    Sepsis secondary to UTI [facility])     Surgical History: Past Surgical History:  Procedure Laterality Date   ANTERIOR CERVICAL DECOMPRESSION/DISCECTOMY FUSION 4 LEVELS N/A 12/26/2020   Procedure: C3-6 ANTERIOR CERVICAL  DECOMPRESSION/DISCECTOMY FUSION 3 LEVELS;  Surgeon: Lucy Chris, MD;  Location: [facility];  Service: Neurosurgery;  Laterality: N/A;   BROW LIFT AND BLEPHAROPLASTY Bilateral 12/21/2019   CATARACT EXTRACTION W/ INTRAOCULAR LENS IMPLANT Left 02/05/2019   COLONOSCOPY WITH PROPOFOL N/A 09/29/2021   Procedure: COLONOSCOPY WITH PROPOFOL;  Surgeon: Regis Bill, MD;  Location: [facility];  Service: Endoscopy;  Laterality: N/A;   KNEE SURGERY Left 2002   Meniscus tear   LITHOTRIPSY  12/13/2020   MASS EXCISION N/A 10/11/2022   Procedure: EXCISION MASS CHEST WALL;  Surgeon: Sung Amabile, DO;  Location: [facility];  Service: General;  Laterality: N/A;   ORIF FEMORAL NECK FRACTURE W/ DHS Left 03/06/2020   SHOULDER ARTHROSCOPY WITH OPEN ROTATOR CUFF REPAIR AND DISTAL CLAVICLE ACROMINECTOMY Left 07/25/2021   Procedure: SHOULDER ARTHROSCOPY WITH OPEN ROTATOR CUFF REPAIR AND DISTAL CLAVICLE ACROMINECTOMY;  Surgeon: Juanell Fairly, MD;  Location: [facility];  Service: Orthopedics;  Laterality: Left;   TOTAL HIP ARTHROPLASTY Left 03/06/2020    Home Medications:  Allergies as of 12/31/2022   No Known Allergies      Medication List        Accurate as of December 31, 2022  5:39 PM. If you have any questions, ask your nurse or doctor.          STOP taking these medications    amoxicillin 500 MG capsule Commonly known as: AMOXIL Stopped by: [facility]  TAKE these medications    alfuzosin 10 MG 24 hr tablet Commonly known as: UROXATRAL Take 1 tablet (10 mg total) by mouth at bedtime.   brimonidine 0.2 % ophthalmic solution Commonly known as: ALPHAGAN Place 1 drop into both eyes 2 (two) times daily.   dorzolamide-timolol 2-0.5 % ophthalmic solution Commonly known as: COSOPT Place 1 drop into both eyes 2 (two) times daily.   dutasteride 0.5 MG capsule Commonly known as: AVODART Take 1 capsule (0.5 mg total) by mouth daily.   latanoprost 0.005 % ophthalmic  solution Commonly known as: XALATAN Place 1 drop into both eyes at bedtime.   lisinopril 40 MG tablet Commonly known as: ZESTRIL Take 40 mg by mouth daily.   traMADol 50 MG tablet Commonly known as: ULTRAM Take 1 tablet (50 mg total) by mouth every 6 (six) hours as needed.   Trulicity 0.75 MG/0.5ML Soaj Generic drug: Dulaglutide Inject 0.75 mg into the skin once a week. Wednesday        Allergies: No Known Allergies  Family History: Family History  Problem Relation Age of Onset   Heart disease Mother    Hypertension Mother    Diabetes Maternal Aunt    Cancer Maternal Uncle        Prostate   Diabetes Paternal Aunt     Social History:  reports that he quit smoking about 3 years ago. His smoking use included cigarettes. He started smoking about 43 years ago. He has a 20 pack-year smoking history. He has never used smokeless tobacco. He reports that he does not currently use drugs after having used the following drugs: Marijuana. He reports that he does not drink alcohol.   Physical Exam: BP (!) 171/108   Pulse 81   Ht 5\' 7"  (1.702 m)   Wt 226 lb (102.5 kg)   BMI 35.40 kg/m   Constitutional:  Alert, No acute distress. HEENT: [place] AT Respiratory: Normal respiratory effort, no increased work of breathing. Psychiatric: Normal mood and affect.   Assessment & Plan:    1. BPH with incomplete bladder emptying Symptomatic improvement on alfuzosin He was unable to give urine today and will schedule a follow-up PVR in 3-4 weeks.  He has a follow-up with Dr. Sampson [facility] next week and UA will be rechecked at that time.  Continue alfuzosin   2. Recurrent UTI May be secondary to incomplete bladder emptying.  For continued infections consider an outlet procedure to improve bladder emptying.   I have reviewed the above documentation for accuracy and completeness, and I agree with the above.   Caleb Altes, MD  [facility] [address],  [address] [place], [place] [address] [phone]

## 2023-01-30 ENCOUNTER — Ambulatory Visit: Payer: Medicare HMO | Admitting: Physician Assistant

## 2023-01-30 ENCOUNTER — Encounter: Payer: Self-pay | Admitting: Physician Assistant

## 2023-01-30 VITALS — BP 152/94 | HR 85

## 2023-01-30 DIAGNOSIS — R339 Retention of urine, unspecified: Secondary | ICD-10-CM | POA: Diagnosis not present

## 2023-01-30 DIAGNOSIS — N401 Enlarged prostate with lower urinary tract symptoms: Secondary | ICD-10-CM

## 2023-01-30 LAB — BLADDER SCAN AMB NON-IMAGING: Scan Result: 186

## 2023-01-30 MED ORDER — ALFUZOSIN HCL ER 10 MG PO TB24
10.0000 mg | ORAL_TABLET | ORAL | 11 refills | Status: DC
Start: 2023-01-30 — End: 2023-05-10

## 2023-01-30 MED ORDER — TADALAFIL 5 MG PO TABS: 2.5000 mg | ORAL_TABLET | ORAL | 11 refills | Status: DC

## 2023-01-30 NOTE — Progress Notes (Signed)
01/30/2023 11:13 AM   [facility] 06-27-1955 [id number]  CC: Chief Complaint  Patient presents with   Follow-up   HPI: [facility] is a 67 y.o. male with PMH recurrent UTI on suppressive Macrobid per Dr. Sampson Goon, BPH with incomplete bladder emptying on alfuzosin, and nephrolithiasis who presents today for follow-up.   Today he reports he was prescribed tadalafil by another provider a while ago and has been taking 2.5 mg 3 times weekly (TTS), alternating this with alfuzosin 10 mg 3 times weekly (MWF).  This regimen, combined with his daily suppressive Macrobid, has given him great urinary symptom relief and overall he states he feels better than he has in a long time.  His BPs have been stable.    He is not on nitrates for chest pain.  PVR 186 mL.  PMH: Past Medical History:  Diagnosis Date   Allergy    Seasonal   BPH (benign prostatic hyperplasia)    Cervical myelopathy (HCC)    Chest wall mass 09/2022   Diabetes mellitus without complication (HCC)    Hepatitis C    History of kidney stones    Hyperlipidemia    Hypertension    Prediabetes    Primary open angle glaucoma (POAG) of left eye    Sepsis secondary to UTI [facility])     Surgical History: Past Surgical History:  Procedure Laterality Date   ANTERIOR CERVICAL DECOMPRESSION/DISCECTOMY FUSION 4 LEVELS N/A 12/26/2020   Procedure: C3-6 ANTERIOR CERVICAL DECOMPRESSION/DISCECTOMY FUSION 3 LEVELS;  Surgeon: Lucy Chris, MD;  Location: [facility];  Service: Neurosurgery;  Laterality: N/A;   BROW LIFT AND BLEPHAROPLASTY Bilateral 12/21/2019   CATARACT EXTRACTION W/ INTRAOCULAR LENS IMPLANT Left 02/05/2019   COLONOSCOPY WITH PROPOFOL N/A 09/29/2021   Procedure: COLONOSCOPY WITH PROPOFOL;  Surgeon: Regis Bill, MD;  Location: [facility];  Service: Endoscopy;  Laterality: N/A;   KNEE SURGERY Left 2002   Meniscus tear   LITHOTRIPSY  12/13/2020   MASS EXCISION N/A 10/11/2022   Procedure: EXCISION MASS CHEST  WALL;  Surgeon: Sung Amabile, DO;  Location: [facility];  Service: General;  Laterality: N/A;   ORIF FEMORAL NECK FRACTURE W/ DHS Left 03/06/2020   SHOULDER ARTHROSCOPY WITH OPEN ROTATOR CUFF REPAIR AND DISTAL CLAVICLE ACROMINECTOMY Left 07/25/2021   Procedure: SHOULDER ARTHROSCOPY WITH OPEN ROTATOR CUFF REPAIR AND DISTAL CLAVICLE ACROMINECTOMY;  Surgeon: Juanell Fairly, MD;  Location: [facility];  Service: Orthopedics;  Laterality: Left;   TOTAL HIP ARTHROPLASTY Left 03/06/2020    Home Medications:  Allergies as of 01/30/2023   No Known Allergies      Medication List        Accurate as of January 30, 2023 11:13 AM. If you have any questions, ask your nurse or doctor.          alfuzosin 10 MG 24 hr tablet Commonly known as: UROXATRAL Take 1 tablet (10 mg total) by mouth 3 (three) times a week. What changed: when to take this Changed by: Carman Ching   brimonidine 0.2 % ophthalmic solution Commonly known as: ALPHAGAN Place 1 drop into both eyes 2 (two) times daily.   dorzolamide-timolol 2-0.5 % ophthalmic solution Commonly known as: COSOPT Place 1 drop into both eyes 2 (two) times daily.   dutasteride 0.5 MG capsule Commonly known as: AVODART Take 1 capsule (0.5 mg total) by mouth daily.   latanoprost 0.005 % ophthalmic solution Commonly known as: XALATAN Place 1 drop into both eyes at bedtime.   lisinopril 40  MG tablet Commonly known as: ZESTRIL Take 40 mg by mouth daily.   nitrofurantoin (macrocrystal-monohydrate) 100 MG capsule Commonly known as: MACROBID SMARTSIG:1.0 Capsule(s) By Mouth Daily   tadalafil 5 MG tablet Commonly known as: CIALIS Take 0.5 tablets (2.5 mg total) by mouth 3 (three) times a week. Started by: Carman Ching   traMADol 50 MG tablet Commonly known as: ULTRAM Take 1 tablet (50 mg total) by mouth every 6 (six) hours as needed.   Trulicity 0.75 MG/0.5ML Soaj Generic drug: Dulaglutide Inject 0.75 mg into the skin  once a week. Wednesday        Allergies:  No Known Allergies  Family History: Family History  Problem Relation Age of Onset   Heart disease Mother    Hypertension Mother    Diabetes Maternal Aunt    Cancer Maternal Uncle        Prostate   Diabetes Paternal Aunt     Social History:   reports that he quit smoking about 3 years ago. His smoking use included cigarettes. He started smoking about 43 years ago. He has a 20 pack-year smoking history. He has never used smokeless tobacco. He reports that he does not currently use drugs after having used the following drugs: Marijuana. He reports that he does not drink alcohol.  Physical Exam: BP (!) 152/94   Pulse 85   Constitutional:  Alert and oriented, no acute distress, nontoxic appearing HEENT: [place], AT Cardiovascular: No clubbing, cyanosis, or edema Respiratory: Normal respiratory effort, no increased work of breathing Skin: No rashes, bruises or suspicious lesions Neurologic: Grossly intact, no focal deficits, moving all 4 extremities Psychiatric: Normal mood and affect  Laboratory Data: Results for orders placed or performed in visit on 01/30/23  BLADDER SCAN AMB NON-IMAGING  Result Value Ref Range   Scan Result 186 ml    Assessment & Plan:   1. Benign prostatic hyperplasia with incomplete bladder emptying He is emptying better today than in the recent past.  Will continue his current drug regimen and see him back in 6 months for symptom recheck. - BLADDER SCAN AMB NON-IMAGING - alfuzosin (UROXATRAL) 10 MG 24 hr tablet; Take 1 tablet (10 mg total) by mouth 3 (three) times a week.  Dispense: 13 tablet; Refill: 11 - tadalafil (CIALIS) 5 MG tablet; Take 0.5 tablets (2.5 mg total) by mouth 3 (three) times a week.  Dispense: 7 tablet; Refill: 11  Return in about 6 months (around 07/31/2023) for IPSS, PVR.  Carman Ching, PA-C  [facility] [place] [address] [place], [place]  [address] [phone]

## 2023-03-08 NOTE — Progress Notes (Unsigned)
Referring Physician:  [facility] [address] [place],  [place] [address]  Primary Physician:  [facility]  History of Present Illness: Caleb Wall has a history of HTN, hyperlipidemia, Hep C, glaucoma. History of cocaine use.   History of ACDF C3-C7 for myelopathy by Dr. Adriana Simas on 12/26/20. He had some residual weakness and numbness postop.   Last seen by me on 12/10/22 for neck and LBP.   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 He has central stenosis C2-C3 with left > right foraminal stenosis. Also with central stenosis C6-C7 with moderate/severe bilateral foraminal stenosis.   He also has known  lumbar spondylosis with right foraminal disc L3-L4 and central stenosis L4-L5 with moderate bilateral foraminal stenosis. Per Whitney's last note, EMG nerve conduction study performed on 09/28/2020 by Dr. Theodis Aguas revealed chronic right L4 radiculopathy upper motor neuron signs were also noted on exam.   He was to follow back up with PMR to discuss more injections. He was started in [facility] and was doing well with this. No further injections done.   He is here for follow up.   He has intermittent LBP with intermittent weakness in right leg. He states he's had this right leg weakness since prior to his cervical surgery. No leg pain.  He has intermittent  neck pain with no arm pain. He's had numbness/tingling on right since surgery-he has this in left as well. He feels like this is worse. He is having some difficulty with hand dexterity and balance- he thinks this may be worse.   Bowel/Bladder Dysfunction: none  Conservative measures:  Physical therapy: none Multimodal medical therapy including regular antiinflammatories: tylenol, ultram, voltaren gel, mobic Injections:  11/12/22: Bilateral S1 TF ESI  05/16/2022: Bilateral S1 transforaminal ESI (50% relief) 11/22/2021: Right L5-S1 transforaminal ESI (75% relief) 07/04/2021: Left sacroiliac  joint injection (no relief) 03/20/2021: Left sacroiliac joint injection (70% relief) 02/15/2020: Left SI joint injection at [facility] (good relief) 12/30/2019: Left L4-5 transforaminal ESI performed at [facility]   Past Surgery:  History of ACDF C3-C7 for myelopathy by Dr. Adriana Simas on 12/26/20  The symptoms are causing a significant impact on the patient's life.   Review of Systems:  A 10 point review of systems is negative, except for the pertinent positives and negatives detailed in the HPI.  Past Medical History: Past Medical History:  Diagnosis Date   Allergy    Seasonal   BPH (benign prostatic hyperplasia)    Cervical myelopathy (HCC)    Chest wall mass 09/2022   Diabetes mellitus without complication (HCC)    Hepatitis C    History of kidney stones    Hyperlipidemia    Hypertension    Prediabetes    Primary open angle glaucoma (POAG) of left eye    Sepsis secondary to UTI [facility])     Past Surgical History: Past Surgical History:  Procedure Laterality Date   ANTERIOR CERVICAL DECOMPRESSION/DISCECTOMY FUSION 4 LEVELS N/A 12/26/2020   Procedure: C3-6 ANTERIOR CERVICAL DECOMPRESSION/DISCECTOMY FUSION 3 LEVELS;  Surgeon: Lucy Chris, MD;  Location: [facility] ORS;  Service: Neurosurgery;  Laterality: N/A;   BROW LIFT AND BLEPHAROPLASTY Bilateral 12/21/2019   CATARACT EXTRACTION W/ INTRAOCULAR LENS IMPLANT Left 02/05/2019   COLONOSCOPY WITH PROPOFOL N/A 09/29/2021   Procedure: COLONOSCOPY WITH PROPOFOL;  Surgeon: Regis Bill, MD;  Location: [facility] ENDOSCOPY;  Service: Endoscopy;  Laterality: N/A;   KNEE SURGERY Left 2002   Meniscus tear   LITHOTRIPSY  12/13/2020  MASS EXCISION N/A 10/11/2022   Procedure: EXCISION MASS CHEST WALL;  Surgeon: Sung Amabile, DO;  Location: [facility] ORS;  Service: General;  Laterality: N/A;   ORIF FEMORAL NECK FRACTURE W/ DHS Left 03/06/2020   SHOULDER ARTHROSCOPY WITH OPEN ROTATOR CUFF REPAIR AND DISTAL CLAVICLE ACROMINECTOMY Left 07/25/2021   Procedure: SHOULDER  ARTHROSCOPY WITH OPEN ROTATOR CUFF REPAIR AND DISTAL CLAVICLE ACROMINECTOMY;  Surgeon: Juanell Fairly, MD;  Location: [facility] ORS;  Service: Orthopedics;  Laterality: Left;   TOTAL HIP ARTHROPLASTY Left 03/06/2020    Allergies: Allergies as of 03/12/2023   (No Known Allergies)    Medications: Outpatient Encounter Medications as of 03/12/2023  Medication Sig   alfuzosin (UROXATRAL) 10 MG 24 hr tablet Take 1 tablet (10 mg total) by mouth 3 (three) times a week.   brimonidine (ALPHAGAN) 0.2 % ophthalmic solution Place 1 drop into both eyes 2 (two) times daily.   dorzolamide-timolol (COSOPT) 22.3-6.8 MG/ML ophthalmic solution Place 1 drop into both eyes 2 (two) times daily.   dutasteride (AVODART) 0.5 MG capsule Take 1 capsule (0.5 mg total) by mouth daily.   latanoprost (XALATAN) 0.005 % ophthalmic solution Place 1 drop into both eyes at bedtime.   lisinopril (ZESTRIL) 40 MG tablet Take 40 mg by mouth daily.   MOUNJARO 10 MG/0.5ML Pen Inject 10 mg into the skin once a week.   nitrofurantoin, macrocrystal-monohydrate, (MACROBID) 100 MG capsule SMARTSIG:1.0 Capsule(s) By Mouth Daily   tadalafil (CIALIS) 5 MG tablet Take 0.5 tablets (2.5 mg total) by mouth 3 (three) times a week.   traMADol (ULTRAM) 50 MG tablet Take 1 tablet (50 mg total) by mouth every 6 (six) hours as needed.   Dulaglutide (TRULICITY) 0.75 MG/0.5ML SOPN Inject 0.75 mg into the skin once a week. Wednesday (Patient not taking: Reported on 03/12/2023)   meloxicam (MOBIC) 15 MG tablet Take 15 mg by mouth daily. (Patient not taking: Reported on 03/12/2023)   No facility-administered encounter medications on file as of 03/12/2023.    Social History: Social History   Tobacco Use   Smoking status: Former    Current packs/day: 0.00    Average packs/day: 0.5 packs/day for 40.0 years (20.0 ttl pk-yrs)    Types: Cigarettes    Start date: 07/1979    Quit date: 07/2019    Years since quitting: 3.6   Smokeless tobacco: Never  Vaping  Use   Vaping status: Never Used  Substance Use Topics   Alcohol use: No   Drug use: Not Currently    Types: Marijuana    Comment: History of cocaine use.     Family Medical History: Family History  Problem Relation Age of Onset   Heart disease Mother    Hypertension Mother    Diabetes Maternal Aunt    Cancer Maternal Uncle        Prostate   Diabetes Paternal Aunt     Physical Examination: Vitals:   03/12/23 1345 03/12/23 1417  BP: (!) 160/80 136/80    Awake, alert, oriented to person, place, and time.  Speech is clear and fluent. Fund of knowledge is appropriate.   Cranial Nerves: Pupils equal round and reactive to light.  Facial tone is symmetric.    No abnormal lesions on exposed skin.   Strength: Side Biceps Triceps Deltoid Interossei Grip Wrist Ext. Wrist Flex.  R 5 5 5 5 5 5 5   L 5 5 5 5 5 5 5    Side Iliopsoas Quads Hamstring PF DF EHL  R 5 5  5 4+ 5 5  L 5 5 5 5 5 5    Reflexes are 3+ and symmetric at the brachioradialis, patella and achilles. Hoffman's is absent.   Bilateral upper and lower extremity sensation is intact to light touch, slightly diminished right lateral thigh compared to left.   He can heel stand. He can toe stand on left, has difficulty toe standing on right.   He cannot heel/toe walk.   Gait is slow.   Medical Decision Making  Imaging: none   Assessment and Plan: Caleb Wall is a pleasant 68 y.o. male has history of ACDF C3-C7 for myelopathy by Dr. Adriana Simas on 12/26/20.   He has intermittent  neck pain with no arm pain. He's had numbness/tingling on right since surgery-he has this in left as well. He feels like this is worse. He is having some difficulty with hand dexterity and balance- he thinks this may be worse.   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 He has central stenosis C2-C3 with left > right foraminal stenosis. Also with central stenosis C6-C7 with moderate/severe bilateral foraminal stenosis.  He has  intermittent LBP with intermittent weakness in right leg. He states he's had this right leg weakness since prior to his cervical surgery. No leg pain.  He has known lumbar spondylosis with right foraminal disc L3-L4 and central stenosis L4-L5 with moderate bilateral foraminal stenosis. Per Whitney's last note, EMG nerve conduction study performed on 09/28/2020 by Dr. Theodis Aguas revealed chronic right L4 radiculopathy upper motor neuron signs were also noted on exam.   Has difficulty standing on toes on right leg.   Treatment options discussed with patient and following plan made:   - He is having more issues with hand dexterity and balance. He is hyper reflexic on exam today. I want him to follow up with Dr. Katrinka Blazing to discuss further options.  - Will check to see if Dr. Katrinka Blazing wants an updated cervical MRI prior to seeing him. He will cervical xrays as well. - Will call him once I review with Dr. Katrinka Blazing.  - He can continue to follow as needed with PMR regarding lumbar spine.   - BP was initially elevated. No symptoms of chest pain, shortness of breath, blurry vision, or headaches. He checks it at home and it generally 120s/70-80s. Recheck looked good. He will recheck at home and call PCP if not improved. If he develops CP, SOB, blurry vision, or headaches, then he will go to ED.   I spent a total of 20 minutes in face-to-face and non-face-to-face activities related to this patient's care today including review of outside records, review of imaging, review of symptoms, physical exam, discussion of differential diagnosis, discussion of treatment options, and documentation.   Drake Leach PA-C Dept. of Neurosurgery

## 2023-03-12 ENCOUNTER — Ambulatory Visit (INDEPENDENT_AMBULATORY_CARE_PROVIDER_SITE_OTHER): Payer: Medicare (Managed Care) | Admitting: Orthopedic Surgery

## 2023-03-12 ENCOUNTER — Encounter: Payer: Self-pay | Admitting: Orthopedic Surgery

## 2023-03-12 VITALS — BP 136/80

## 2023-03-12 DIAGNOSIS — G9589 Other specified diseases of spinal cord: Secondary | ICD-10-CM | POA: Diagnosis not present

## 2023-03-12 DIAGNOSIS — M48061 Spinal stenosis, lumbar region without neurogenic claudication: Secondary | ICD-10-CM

## 2023-03-12 DIAGNOSIS — M47812 Spondylosis without myelopathy or radiculopathy, cervical region: Secondary | ICD-10-CM

## 2023-03-12 DIAGNOSIS — M542 Cervicalgia: Secondary | ICD-10-CM

## 2023-03-12 DIAGNOSIS — M4802 Spinal stenosis, cervical region: Secondary | ICD-10-CM

## 2023-03-12 DIAGNOSIS — Z981 Arthrodesis status: Secondary | ICD-10-CM

## 2023-03-12 DIAGNOSIS — M47816 Spondylosis without myelopathy or radiculopathy, lumbar region: Secondary | ICD-10-CM | POA: Diagnosis not present

## 2023-03-12 NOTE — Patient Instructions (Signed)
It was so nice to see you today. Thank you so much for coming in.    I want you to come back to see Dr. Katrinka Blazing about your neck. I am worried that your balance and using your hands is worse. I will check with him to see if he wants to get an updated MRI of your neck and let you know.   Blood pressure looked good when we checked it again. Keep checking it at home.   Please do not hesitate to call if you have any questions or concerns. You can also message me in MyChart.   Drake Leach PA-C [phone]     The physicians and staff at [facility] are committed to providing excellent care. You may receive a survey asking for feedback about your experience at our office. We value you your feedback and appreciate you taking the time to to fill it out. The [facility] leadership team is also available to discuss your experience in person, feel free to contact us [phone].

## 2023-03-13 NOTE — Addendum Note (Signed)
Addended byDrake Leach on: 03/13/2023 09:23 AM   Modules accepted: Orders

## 2023-03-18 ENCOUNTER — Ambulatory Visit: Admission: RE | Admit: 2023-03-18 | Payer: Medicare (Managed Care) | Source: Ambulatory Visit

## 2023-03-20 ENCOUNTER — Ambulatory Visit
Admission: RE | Admit: 2023-03-20 | Discharge: 2023-03-20 | Disposition: A | Discharge status: Home / Self Care | Payer: Medicare (Managed Care) | Source: Ambulatory Visit | Attending: Orthopedic Surgery | Admitting: Orthopedic Surgery

## 2023-03-20 DIAGNOSIS — M542 Cervicalgia: Secondary | ICD-10-CM | POA: Diagnosis present

## 2023-03-20 DIAGNOSIS — M4802 Spinal stenosis, cervical region: Secondary | ICD-10-CM | POA: Diagnosis present

## 2023-03-20 DIAGNOSIS — Z981 Arthrodesis status: Secondary | ICD-10-CM | POA: Insufficient documentation

## 2023-03-20 DIAGNOSIS — M47812 Spondylosis without myelopathy or radiculopathy, cervical region: Secondary | ICD-10-CM | POA: Diagnosis present

## 2023-04-01 ENCOUNTER — Telehealth: Payer: Self-pay | Admitting: Orthopedic Surgery

## 2023-04-01 NOTE — Telephone Encounter (Signed)
 He had cervical CT done. Please schedule him f/u with Dr. Felipe Horton to discuss results of CT (put in NP slot, cervical pseudoarthrosis per Loyce Ruffini). He also needs to get cervical xrays done prior to this appt. They are ordered.   Thanks.

## 2023-04-02 NOTE — Progress Notes (Unsigned)
Referring Physician:  [facility] [address] [place],  [place] [address]  Primary Physician:  [facility]  History of Present Illness: 04/03/2023 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  Patient has a history of hypertension, hep C, hyperlipidemia, glaucoma.  He had a C3-C6 ACDF on 12/26/2020.  He had some residual weakness and numbness but over the past few months to year has had progressive weakness that was not previously known.  He has significant neck pain that is improved with certain positions.  States that it happens every day.  He is also noticed difficulty with his right lower extremity and right upper extremity with progressive weakness as well.  He was seeing our team for the past few months and had repeat MRI of his cervical spine which shows significant stenosis   Conservative measures:  Physical therapy: none Multimodal medical therapy including regular antiinflammatories: tylenol, ultram, voltaren gel, mobic Injections:  11/12/22: Bilateral S1 TF ESI  05/16/2022: Bilateral S1 transforaminal ESI (50% relief) 11/22/2021: Right L5-S1 transforaminal ESI (75% relief) 07/04/2021: Left sacroiliac joint injection (no relief) 03/20/2021: Left sacroiliac joint injection (70% relief) 02/15/2020: Left SI joint injection at [facility] (good relief) 12/30/2019: Left L4-5 transforaminal ESI performed at [facility]    Past Surgery:  History of ACDF C3-C7 for myelopathy by Dr. Adriana Simas on 12/26/20  Review of Systems:  A 10 point review of systems is negative, except for the pertinent positives and negatives detailed in the HPI.  Past Medical History: Past Medical History:  Diagnosis Date   Allergy    Seasonal   BPH (benign prostatic hyperplasia)    Cervical myelopathy (HCC)    Chest wall mass 09/2022   Diabetes mellitus without complication (HCC)    Hepatitis C    History of  kidney stones    Hyperlipidemia    Hypertension    Prediabetes    Primary open angle glaucoma (POAG) of left eye    Sepsis secondary to UTI [facility])     Past Surgical History: Past Surgical History:  Procedure Laterality Date   ANTERIOR CERVICAL DECOMPRESSION/DISCECTOMY FUSION 4 LEVELS N/A 12/26/2020   Procedure: C3-6 ANTERIOR CERVICAL DECOMPRESSION/DISCECTOMY FUSION 3 LEVELS;  Surgeon: Lucy Chris, MD;  Location: [facility] ORS;  Service: Neurosurgery;  Laterality: N/A;   BROW LIFT AND BLEPHAROPLASTY Bilateral 12/21/2019   CATARACT EXTRACTION W/ INTRAOCULAR LENS IMPLANT Left 02/05/2019   COLONOSCOPY WITH PROPOFOL N/A 09/29/2021   Procedure: COLONOSCOPY WITH PROPOFOL;  Surgeon: Regis Bill, MD;  Location: [facility] ENDOSCOPY;  Service: Endoscopy;  Laterality: N/A;   KNEE SURGERY Left 2002   Meniscus tear   LITHOTRIPSY  12/13/2020   MASS EXCISION N/A 10/11/2022   Procedure: EXCISION MASS CHEST WALL;  Surgeon: Sung Amabile, DO;  Location: [facility] ORS;  Service: General;  Laterality: N/A;   ORIF FEMORAL NECK FRACTURE W/ DHS Left 03/06/2020   SHOULDER ARTHROSCOPY WITH OPEN ROTATOR CUFF REPAIR AND DISTAL CLAVICLE ACROMINECTOMY Left 07/25/2021   Procedure: SHOULDER ARTHROSCOPY WITH OPEN ROTATOR CUFF REPAIR AND DISTAL CLAVICLE ACROMINECTOMY;  Surgeon: Juanell Fairly, MD;  Location: [facility] ORS;  Service: Orthopedics;  Laterality: Left;   TOTAL HIP ARTHROPLASTY Left 03/06/2020    Allergies: Allergies as of 04/03/2023   (No Known Allergies)    Medications:  Current Outpatient Medications:    alfuzosin (UROXATRAL) 10 MG 24 hr tablet, Take 1 tablet (10 mg total) by mouth 3 (three) times a week.,  Disp: 13 tablet, Rfl: 11   brimonidine (ALPHAGAN) 0.2 % ophthalmic solution, Place 1 drop into both eyes 2 (two) times daily., Disp: , Rfl:    dorzolamide-timolol (COSOPT) 22.3-6.8 MG/ML ophthalmic solution, Place 1 drop into both eyes 2 (two) times daily., Disp: , Rfl:    latanoprost (XALATAN) 0.005 %  ophthalmic solution, Place 1 drop into both eyes at bedtime., Disp: , Rfl:    meloxicam (MOBIC) 15 MG tablet, Take 15 mg by mouth daily., Disp: , Rfl:    MOUNJARO 10 MG/0.5ML Pen, Inject 10 mg into the skin once a week., Disp: , Rfl:    nitrofurantoin, macrocrystal-monohydrate, (MACROBID) 100 MG capsule, SMARTSIG:1.0 Capsule(s) By Mouth Daily, Disp: , Rfl:    tadalafil (CIALIS) 5 MG tablet, Take 0.5 tablets (2.5 mg total) by mouth 3 (three) times a week., Disp: 7 tablet, Rfl: 11   traMADol (ULTRAM) 50 MG tablet, Take 1 tablet (50 mg total) by mouth every 6 (six) hours as needed., Disp: 6 tablet, Rfl: 0  Social History: Social History   Tobacco Use   Smoking status: Former    Current packs/day: 0.00    Average packs/day: 0.5 packs/day for 40.0 years (20.0 ttl pk-yrs)    Types: Cigarettes    Start date: 07/1979    Quit date: 07/2019    Years since quitting: 3.7   Smokeless tobacco: Never  Vaping Use   Vaping status: Never Used  Substance Use Topics   Alcohol use: No   Drug use: Not Currently    Types: Marijuana    Comment: History of cocaine use.     Family Medical History: Family History  Problem Relation Age of Onset   Heart disease Mother    Hypertension Mother    Diabetes Maternal Aunt    Cancer Maternal Uncle        Prostate   Diabetes Paternal Aunt     Physical Examination: Vitals:   04/03/23 1013  BP: 114/76    General: Patient is in no apparent distress. Attention to examination is appropriate.  Neck:   Supple.  Full range of motion.  Respiratory: Patient is breathing without any difficulty.   NEUROLOGICAL:     Awake, alert, oriented to person, place, and time.  Speech is clear and fluent.   Cranial Nerves: Pupils equal round and reactive to light.  Facial tone is symmetric.  Facial sensation is symmetric. Shoulder shrug is symmetric. Tongue protrusion is midline.    Strength: Side Biceps Triceps Deltoid Interossei Grip Wrist Ext. Wrist Flex.  R 4 5 4  4 5 5 5   L 5 5 5 4 5 5 5    Side Iliopsoas Quads Hamstring PF DF EHL  R 4 5 5 4 4 5   L 5 5 5 4 4 5    He has hyperreflexia noted in the bilateral upper extremities right worse than left.  He has severe spreading of his biceps reflex triceps reflex and pectoralis reflex on the right, he is hyperreflexic but not as severe on the left.  He has positive Hoffmann sign bilaterally.  He has crossed adductors as well in the lower extremities.  He has decree sensation in significant most on the right  Gait is wide-based, unable to perform tandem gait.  States that he feels wobbly when walking and normal situations.  Imaging: Narrative & Impression  CLINICAL DATA:  Cervical radiculopathy and cervicalgia. History of cervical spine fusion. Right leg weakness.   EXAM: MRI CERVICAL AND LUMBAR SPINE WITHOUT CONTRAST  TECHNIQUE: Multiplanar and multiecho pulse sequences of the cervical spine, to include the craniocervical junction and cervicothoracic junction, and lumbar spine, were obtained without intravenous contrast.   COMPARISON:  Cervical spine radiographs 01/23/2022, cervical spine CT 11/01/2020. Outside MRI of the cervical spine 10/21/2020 and lumbar spine 03/05/2020 from [facility].   FINDINGS: MRI CERVICAL SPINE FINDINGS   Alignment: Straightening without focal angulation or significant listhesis.   Vertebrae: As shown on the most recent radiographs, the patient has undergone C3-6 ACDF since the previous MRI and CT. Allowing for the artifact associated with the hardware, no evidence of acute fracture or traumatic subluxation. 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   Cord: There is chronic multilevel cord flattening, detailed at the individual levels below. Chronic cord atrophy and T2 hyperintensity at C3-4 consistent with myelomalacia, similar to previous MRI. No acute cord findings identified.   Posterior Fossa, vertebral arteries, paraspinal tissues:  Visualized portions of the posterior fossa appear unremarkable.Bilateral vertebral artery flow voids. No significant paraspinal findings.   Disc levels:   C2-3: Progressive spondylosis with loss of disc height and an asymmetric left foraminal disc osteophyte complex. Chronic left greater than right foraminal narrowing. Interval increased spinal stenosis with increased left-sided cord flattening. Chronic severe left and mild right foraminal narrowing.   C3-4: Interval ACDF with moderate residual spinal stenosis and foraminal narrowing bilaterally. Underlying chronic myelomalacia as described above.   C4-5: Interval ACDF with improved patency of the spinal canal and foramina bilaterally. Mild foraminal narrowing bilaterally. No significant residual spinal stenosis.   C5-6: Interval ACDF with improved patency of the spinal canal and foramina bilaterally. Mild foraminal narrowing bilaterally. No significant residual spinal stenosis.   C6-7: Chronic spondylosis with loss of disc height, disc bulging and endplate osteophytes asymmetric to the right. Mild right-sided cord flattening and moderate to severe foraminal narrowing bilaterally are similar to previous MRI.   C7-T1: Chronic disc bulging with bilateral uncinate spurring and facet hypertrophy. No significant central spinal stenosis. Moderate right-greater-than-left foraminal narrowing appears similar to the previous MRI.   Images demonstrate grossly stable spondylosis at T1-2 and T2-3 without resulting cord deformity or high-grade foraminal narrowing.   MRI LUMBAR SPINE FINDINGS   Segmentation: Conventional anatomy assumed, with the last open disc space designated L5-S1.Appears concordant with previous imaging.   Alignment:  Minimal degenerative anterolisthesis at L3-4 and L4-5.   Vertebrae: No worrisome osseous lesion, acute fracture or pars defect. The lumbar pedicles are somewhat short on a congenital basis. The  visualized sacroiliac joints appear unremarkable.   Conus medullaris: Extends to the L1-2 level and appears normal.   Paraspinal and other soft tissues: No significant paraspinal findings.   Disc levels:   Sagittal images demonstrate disc bulging and endplate osteophytes at T11-12 with resulting mild left foraminal narrowing. No cord deformity. The T12-L1 level appears unremarkable.   L1-2: Preserved disc height and hydration. Mild facet hypertrophy. No spinal stenosis or nerve root encroachment.   L2-3: Preserved disc height with mild disc bulging eccentric to the left and mild to moderate facet and ligamentous hypertrophy. Mild left foraminal narrowing without definite nerve root encroachment.   L3-4: Mildly progressive loss of disc height with annular disc bulging and a broad-based right foraminal disc protrusion. Moderate facet and ligamentous hypertrophy. Resulting mild to moderate multifactorial spinal stenosis which appears mildly progressive. Moderate right foraminal narrowing also appears progressive with possible right L3 nerve root encroachment. Stable mild narrowing of the left foramen.   L4-5: Chronic loss  of disc height with annular disc bulging and endplate osteophytes, mildly progressive from previous MRI. Advanced facet and ligamentous hypertrophy. Resulting progressive mild-to-moderate multifactorial spinal stenosis with moderate foraminal narrowing bilaterally.   L5-S1: Relatively preserved disc height with mild disc bulging and asymmetric left-sided facet hypertrophy. The spinal canal and lateral recesses are patent. Mild foraminal narrowing bilaterally.   IMPRESSION: 1. Compared with outside MRI from 10/21/2020, the patient has undergone C3-6 ACDF. There is improved patency of the spinal canal and foramina at the operative levels. 2. Chronic myelomalacia at C3-4 without acute cord findings. 3. Progressive spondylosis at C2-3 with resulting increased  spinal stenosis and left-sided cord flattening. Chronic left-greater-than-right foraminal narrowing. 4. Chronic spondylosis at C6-7 with resulting mild right-sided cord flattening and moderate to severe foraminal narrowing bilaterally, similar to previous MRI. 5. Mildly progressive spondylosis at L3-4 and L4-5. At L3-4, there is a broad-based right foraminal disc protrusion which may contribute to right L3 nerve root encroachment. At L4-5, there is progressive mild-to-moderate multifactorial spinal stenosis and moderate foraminal narrowing bilaterally. 6. No acute findings identified.     Electronically Signed   By: Carey Bullocks M.D.   On: 10/14/2022 16:39     Narrative & Impression  CLINICAL DATA:  For history: Status post cervical spinal fusion. Cervical spondylosis. Spinal stenosis in cervical region. Cervicalgia. Neck pain, chronic. Additional history provided by scanning technologist: Patient reports right arm weakness and numbness, neck pain while sleeping.   EXAM: CT CERVICAL SPINE WITHOUT CONTRAST   TECHNIQUE: Multidetector CT imaging of the cervical spine was performed without intravenous contrast. Multiplanar CT image reconstructions were also generated.   RADIATION DOSE REDUCTION: This exam was performed according to the departmental dose-optimization program which includes automated exposure control, adjustment of the mA and/or kV according to patient size and/or use of iterative reconstruction technique.   COMPARISON:  Cervical spine MRI 10/01/2022.   FINDINGS: Alignment: No significant spondylolisthesis.   Skull base and vertebrae: The basion-dental and atlanto-dental intervals are maintained No evidence of a cervical spine fracture. C6-C7 ventral osteophyte.   Soft tissues and spinal canal: No paraspinal mass or collection.   Disc levels:   Unless otherwise stated, the level by level findings below have not significantly changed from the prior  MRI of 10/01/2022.   Prior C3-C6 ACDF. Solid osseous fusion across the C5-C6 disc space. No appreciable solid bridging osseous fusion across the C4-C5 or C3-C4 disc spaces. Lucency surrounding portions of the bilateral C3 screws, suggesting hardware loosening (for instance as seen on series 4 images 38 and 41).   Disc space narrowing at the non-operative levels, greatest at C6-C7 (advanced) and T2-T3 (moderate-to-advanced).   Degenerative changes at the C1-C2 articulation.   C2-C3: Left center disc protrusion. Associated left-sided disc osteophyte ridge/uncinate hypertrophy. Uncovertebral hypertrophy also present on the right. Moderate spinal canal stenosis (greater on the left). Bilateral bony neural from narrowing (mild right, moderate/severe left).   C3-C4: Prior ACDF. Endplate spurring with bilateral uncovertebral hypertrophy. Moderate spinal canal stenosis. Severe bilateral bony neural foraminal narrowing.   C4-C5: Prior ACDF. Endplate spurring. Bilateral acute hypertrophy. Facet spurring on the right. No significant spinal canal stenosis. Moderate right neural foraminal narrowing on the right.   C5-C6: Prior ACDF. Bilateral uncovertebral hypertrophy. Facet spurring on the right. No significant spinal canal stenosis. Mild relative bony neural foraminal narrowing on the right.   C6-C7: Disc bulge with endplate spurring and bilateral disc osteophyte ridge/uncinate hypertrophy. Moderate spinal canal stenosis (greater on the right). Moderate/severe bilateral  neural foraminal narrowing.   C7-T1: Disc bulge with bilateral disc osteophyte ridge. Mild spinal canal stenosis. Mild bilateral bony neural foraminal narrowing.   Upper chest: No consolidation within the imaged lung apices   Other: Subcentimeter nodule within the left thyroid lobe not meeting consensus criteria for ultrasound follow-up based on size. No follow-up imaging recommended. Reference: J Am Coll Radiol.  2015 Feb;12(2): 143-50.   IMPRESSION: 1. Prior C3-C6 ACDF. Solid osseous fusion across the C5-C6 disc space. No appreciable solid bridging osseous fusion across the C4-C5 or C3-C4 disc spaces. Lucency surrounding portions of the bilateral C3 screws suggesting hardware loosening. Moderate spinal canal stenosis at C3-C4. No significant spinal canal stenosis at the remaining operative levels. Bony neural foraminal narrowing bilaterally at C3-C4 (severe) and on the right at C4-C5 (moderate). 2. Cervical spondylosis at the remaining levels, not significant changed from the prior cervical spine MRI of 10/01/2022. At the non-operative levels, spinal canal stenosis is greatest at C2-C3 and C6-C7 (moderate at these levels). At the non-operative levels, foraminal stenosis is greatest on the left at C2-C3 and bilaterally at C6-C7 (moderate/severe at these sites). 3. Disc space narrowing is greatest at C6-C7 (advanced) and T2-T3 (moderate-to-advanced post pressure     Electronically Signed   By: Jackey Loge D.O.   On: 04/01/2023 08:45   I have personally reviewed the images and agree with the above interpretation.  Medical Decision Making/Assessment and Plan: [name] is a pleasant 68 y.o. male with a history of cervical myelopathy and a C3 through see 6 anterior cervical discectomy in 2022.  He had some residual weakness and numbness on the right but has felt like this has been progressing over the past multiple months.  He has been following conservatively with injections and evaluations.  He had a recent MRI and CT scan done as part of a workup by my teammates Drake Leach, the findings of those resulted in a consultation with myself.  On physical examination he has weakness in the right upper and lower extremity, this appears to be progressive even from when he has seen Drake Leach.  He did previously have weakness prior to his ACDF and it appears that he is just having some progression and  regression of his prior issues.  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  On his imaging he shows pseudoarthrosis at C3-4 and C4-5 with a solid fusion at C5-6, he has a collapse at the C6-7 disc space causing focal kyphosis at this level.  He has draping of the spinal cord over the C6-7 level.  He also has continued and worsening stenosis at the C2-3 and C3-4 level as well.  At this point he appears to be symptomatic with a progressive cervical myelopathy in the setting of adjacent level disease both cranial and caudal to his previous construct.  The hyperreflexia in the pectoralis and biceps tendon are evidence of this, we will plan to have him come in to visit [place] with his spouse to discuss things further.  On his flexion-extension x-rays he does have a kyphotic deformity at C6-7 which is causing the draping of the spinal cord.   In order to correct his current issues he would likely benefit from a front back cervical intervention.  In order to get him some correction at the kyphotic level would recommend an anterior cervical discectomy and fusion with a stand-alone shim  based implant at C6-7 this will help with both decompression and height restoration.  Then given his C3-4 and C4-5 pseudoarthrosis as well as his C2-C4 worsening cervical spinal stenosis we would plan for a posterior cervical decompression and a C2-T2 posterior spinal fusion.  Will discuss this further with his wife present to help with his decision making.  We discussed the risk and benefits of surgery that this would be a major intervention likely requiring approximately 6 months of recovery.   Thank you for involving me in the care of this patient.    Lovenia Kim MD/MSCR Neurosurgery

## 2023-04-03 ENCOUNTER — Ambulatory Visit: Payer: Medicare (Managed Care) | Admitting: Neurosurgery

## 2023-04-03 ENCOUNTER — Ambulatory Visit
Admission: RE | Admit: 2023-04-03 | Discharge: 2023-04-03 | Disposition: A | Discharge status: Home / Self Care | Payer: Medicare (Managed Care) | Attending: Orthopedic Surgery | Admitting: Orthopedic Surgery

## 2023-04-03 ENCOUNTER — Ambulatory Visit
Admission: RE | Admit: 2023-04-03 | Discharge: 2023-04-03 | Disposition: A | Discharge status: Home / Self Care | Payer: Medicare (Managed Care) | Source: Ambulatory Visit | Attending: Orthopedic Surgery | Admitting: Orthopedic Surgery

## 2023-04-03 ENCOUNTER — Encounter: Payer: Self-pay | Admitting: Neurosurgery

## 2023-04-03 VITALS — BP 114/76 | Ht 67.0 in | Wt 226.0 lb

## 2023-04-03 DIAGNOSIS — Z981 Arthrodesis status: Secondary | ICD-10-CM | POA: Insufficient documentation

## 2023-04-03 DIAGNOSIS — M542 Cervicalgia: Secondary | ICD-10-CM | POA: Insufficient documentation

## 2023-04-03 DIAGNOSIS — M4802 Spinal stenosis, cervical region: Secondary | ICD-10-CM

## 2023-04-03 DIAGNOSIS — M4712 Other spondylosis with myelopathy, cervical region: Secondary | ICD-10-CM

## 2023-04-03 DIAGNOSIS — M960 Pseudarthrosis after fusion or arthrodesis: Secondary | ICD-10-CM | POA: Diagnosis not present

## 2023-04-03 DIAGNOSIS — M47812 Spondylosis without myelopathy or radiculopathy, cervical region: Secondary | ICD-10-CM | POA: Diagnosis present

## 2023-04-03 DIAGNOSIS — R29898 Other symptoms and signs involving the musculoskeletal system: Secondary | ICD-10-CM

## 2023-04-03 DIAGNOSIS — M4012 Other secondary kyphosis, cervical region: Secondary | ICD-10-CM

## 2023-04-03 NOTE — Patient Instructions (Signed)
It was a pleasure seeing you today at [facility] neurosurgery in [place].  We discussed your previous cervical fusion and your progressive symptoms.  Seems that your progressive symptoms are likely because some of your fusion has broken down and the level below is also becoming unstable.  We will follow-up on your x-rays once we do that we like to have a repeat visit with you.  Will ask our team to schedule you for a in person visit at 2/21 so we can discuss this with your wife present.  At that time we can discuss booking surgery to go forward with decompression and fusion from the back of your neck.

## 2023-04-12 ENCOUNTER — Ambulatory Visit: Payer: Medicare (Managed Care) | Admitting: Neurosurgery

## 2023-04-12 ENCOUNTER — Encounter: Payer: Self-pay | Admitting: Neurosurgery

## 2023-04-12 ENCOUNTER — Other Ambulatory Visit: Payer: Self-pay

## 2023-04-12 VITALS — BP 130/86 | Ht 67.0 in | Wt 226.0 lb

## 2023-04-12 DIAGNOSIS — M40292 Other kyphosis, cervical region: Secondary | ICD-10-CM | POA: Diagnosis not present

## 2023-04-12 DIAGNOSIS — Z01818 Encounter for other preprocedural examination: Secondary | ICD-10-CM

## 2023-04-12 DIAGNOSIS — M4802 Spinal stenosis, cervical region: Secondary | ICD-10-CM | POA: Diagnosis not present

## 2023-04-12 DIAGNOSIS — S129XXA Fracture of neck, unspecified, initial encounter: Secondary | ICD-10-CM | POA: Insufficient documentation

## 2023-04-12 DIAGNOSIS — Z981 Arthrodesis status: Secondary | ICD-10-CM | POA: Diagnosis not present

## 2023-04-12 DIAGNOSIS — M4712 Other spondylosis with myelopathy, cervical region: Secondary | ICD-10-CM | POA: Diagnosis not present

## 2023-04-12 DIAGNOSIS — S129XXS Fracture of neck, unspecified, sequela: Secondary | ICD-10-CM

## 2023-04-12 NOTE — Progress Notes (Signed)
 Referring Physician:  [facility] [address] [place],  [place] [address]  Primary Physician:  [facility]  History of Present Illness: 04/12/2023 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  Patient has a history of hypertension, hep C, hyperlipidemia, glaucoma.  He had a C3-C6 ACDF on 12/26/2020.    He initially did well with his surgery, never regained full strength but did have improvement in his motor function.  Unfortunately he feels like he has had progressive weakness numbness tingling and loss of hand function especially over the past few months to year.  Has neck pain as position dependent and worsens while he is upright.  He says he gets severe back tiredness and soreness each day where he feels tightness in the back of his neck and cramping.  He also has weakness in his right lower extremity and right upper extremity.  He saw me recently to evaluate him for his progressive symptoms.  At that time I felt like he would likely require a surgical intervention for his proximal junctional kyphosis, and pseudoarthrosis resulting in a positive sagittal balance at the cervical spine, he wanted his family to be present for discussion so is here again in clinic today.  He has not had any major progression since we last saw him a few weeks ago   Conservative measures:  Physical therapy: none Multimodal medical therapy including regular antiinflammatories: tylenol, ultram, voltaren gel, mobic Injections:  11/12/22: Bilateral S1 TF ESI  05/16/2022: Bilateral S1 transforaminal ESI (50% relief) 11/22/2021: Right L5-S1 transforaminal ESI (75% relief) 07/04/2021: Left sacroiliac joint injection (no relief) 03/20/2021: Left sacroiliac joint injection (70% relief) 02/15/2020: Left SI joint injection at [facility] (good relief) 12/30/2019: Left L4-5 transforaminal ESI performed at [facility]     Past Surgery:  History of ACDF C3-C7 for myelopathy by Dr. Adriana Simas on 12/26/20  Review of Systems:  A 10 point review of systems is negative, except for the pertinent positives and negatives detailed in the HPI.  Past Medical History: Past Medical History:  Diagnosis Date   Allergy    Seasonal   BPH (benign prostatic hyperplasia)    Cervical myelopathy (HCC)    Chest wall mass 09/2022   Diabetes mellitus without complication (HCC)    Hepatitis C    History of kidney stones    Hyperlipidemia    Hypertension    Prediabetes    Primary open angle glaucoma (POAG) of left eye    Sepsis secondary to UTI [facility])     Past Surgical History: Past Surgical History:  Procedure Laterality Date   ANTERIOR CERVICAL DECOMPRESSION/DISCECTOMY FUSION 4 LEVELS N/A 12/26/2020   Procedure: C3-6 ANTERIOR CERVICAL DECOMPRESSION/DISCECTOMY FUSION 3 LEVELS;  Surgeon: Lucy Chris, MD;  Location: [facility] ORS;  Service: Neurosurgery;  Laterality: N/A;   BROW LIFT AND BLEPHAROPLASTY Bilateral 12/21/2019   CATARACT EXTRACTION W/ INTRAOCULAR LENS IMPLANT Left 02/05/2019   COLONOSCOPY WITH PROPOFOL N/A 09/29/2021   Procedure: COLONOSCOPY WITH PROPOFOL;  Surgeon: Regis Bill, MD;  Location: [facility] ENDOSCOPY;  Service: Endoscopy;  Laterality: N/A;   KNEE SURGERY Left 2002   Meniscus tear   LITHOTRIPSY  12/13/2020   MASS EXCISION N/A 10/11/2022   Procedure: EXCISION MASS CHEST WALL;  Surgeon: Sung Amabile, DO;  Location: [facility] ORS;  Service: General;  Laterality: N/A;   ORIF FEMORAL NECK FRACTURE W/ DHS Left 03/06/2020   SHOULDER ARTHROSCOPY WITH OPEN ROTATOR CUFF  REPAIR AND DISTAL CLAVICLE ACROMINECTOMY Left 07/25/2021   Procedure: SHOULDER ARTHROSCOPY WITH OPEN ROTATOR CUFF REPAIR AND DISTAL CLAVICLE ACROMINECTOMY;  Surgeon: Juanell Fairly, MD;  Location: [facility] ORS;  Service: Orthopedics;  Laterality: Left;   TOTAL HIP ARTHROPLASTY Left 03/06/2020    Allergies: Allergies as of 04/12/2023   (No Known  Allergies)    Medications:  Current Outpatient Medications:    alfuzosin (UROXATRAL) 10 MG 24 hr tablet, Take 1 tablet (10 mg total) by mouth 3 (three) times a week., Disp: 13 tablet, Rfl: 11   brimonidine (ALPHAGAN) 0.2 % ophthalmic solution, Place 1 drop into both eyes 2 (two) times daily., Disp: , Rfl:    dorzolamide-timolol (COSOPT) 22.3-6.8 MG/ML ophthalmic solution, Place 1 drop into both eyes 2 (two) times daily., Disp: , Rfl:    latanoprost (XALATAN) 0.005 % ophthalmic solution, Place 1 drop into both eyes at bedtime., Disp: , Rfl:    meloxicam (MOBIC) 15 MG tablet, Take 15 mg by mouth daily., Disp: , Rfl:    MOUNJARO 10 MG/0.5ML Pen, Inject 10 mg into the skin once a week., Disp: , Rfl:    nitrofurantoin, macrocrystal-monohydrate, (MACROBID) 100 MG capsule, SMARTSIG:1.0 Capsule(s) By Mouth Daily, Disp: , Rfl:    tadalafil (CIALIS) 5 MG tablet, Take 0.5 tablets (2.5 mg total) by mouth 3 (three) times a week., Disp: 7 tablet, Rfl: 11   traMADol (ULTRAM) 50 MG tablet, Take 1 tablet (50 mg total) by mouth every 6 (six) hours as needed., Disp: 6 tablet, Rfl: 0  Social History: Social History   Tobacco Use   Smoking status: Former    Current packs/day: 0.00    Average packs/day: 0.5 packs/day for 40.0 years (20.0 ttl pk-yrs)    Types: Cigarettes    Start date: 07/1979    Quit date: 07/2019    Years since quitting: 3.7   Smokeless tobacco: Never  Vaping Use   Vaping status: Never Used  Substance Use Topics   Alcohol use: No   Drug use: Not Currently    Types: Marijuana    Comment: History of cocaine use.     Family Medical History: Family History  Problem Relation Age of Onset   Heart disease Mother    Hypertension Mother    Diabetes Maternal Aunt    Cancer Maternal Uncle        Prostate   Diabetes Paternal Aunt     Physical Examination: Vitals:   04/12/23 0935  BP: 130/86    General: Patient is in no apparent distress. Attention to examination is  appropriate.  Neck:   Supple.  Forward carriage, limited extension  Respiratory: Patient is breathing without any difficulty.   NEUROLOGICAL:     Awake, alert, oriented to person, place, and time.  Speech is clear and fluent.   Cranial Nerves: Pupils equal round and reactive to light.  Facial tone is symmetric.  Facial sensation is symmetric. Shoulder shrug is symmetric. Tongue protrusion is midline.    Strength: Side Biceps Triceps Deltoid Interossei Grip Wrist Ext. Wrist Flex.  R 4 5 4 4 5 5 5   L 5 5 5 4 5 5 5    Side Iliopsoas Quads Hamstring PF DF EHL  R 4 5 5 4 4 5   L 5 5 5 4 4 5    He has hyperreflexia noted in the bilateral upper extremities right worse than left.  He has severe spreading of his biceps reflex triceps reflex and pectoralis reflex on the right, he is hyperreflexic  but not as severe on the left.  He has positive Hoffmann sign bilaterally.  He has crossed adductors as well in the lower extremities.  He has decreased sensation in significant most on the right  Gait is wide-based and unsteady,, unable to perform tandem gait.  Turns slowly states that he feels wobbly when walking and normal situations.  Imaging: Narrative & Impression  CLINICAL DATA:  Cervical radiculopathy and cervicalgia. History of cervical spine fusion. Right leg weakness.   EXAM: MRI CERVICAL AND LUMBAR SPINE WITHOUT CONTRAST   TECHNIQUE: Multiplanar and multiecho pulse sequences of the cervical spine, to include the craniocervical junction and cervicothoracic junction, and lumbar spine, were obtained without intravenous contrast.   COMPARISON:  Cervical spine radiographs 01/23/2022, cervical spine CT 11/01/2020. Outside MRI of the cervical spine 10/21/2020 and lumbar spine 03/05/2020 from [facility].   FINDINGS: MRI CERVICAL SPINE FINDINGS   Alignment: Straightening without focal angulation or significant listhesis.   Vertebrae: As shown on the most recent radiographs, the  patient has undergone C3-6 ACDF since the previous MRI and CT. Allowing for the artifact associated with the hardware, no evidence of acute fracture or traumatic subluxation. 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   Cord: There is chronic multilevel cord flattening, detailed at the individual levels below. Chronic cord atrophy and T2 hyperintensity at C3-4 consistent with myelomalacia, similar to previous MRI. No acute cord findings identified.   Posterior Fossa, vertebral arteries, paraspinal tissues: Visualized portions of the posterior fossa appear unremarkable.Bilateral vertebral artery flow voids. No significant paraspinal findings.   Disc levels:   C2-3: Progressive spondylosis with loss of disc height and an asymmetric left foraminal disc osteophyte complex. Chronic left greater than right foraminal narrowing. Interval increased spinal stenosis with increased left-sided cord flattening. Chronic severe left and mild right foraminal narrowing.   C3-4: Interval ACDF with moderate residual spinal stenosis and foraminal narrowing bilaterally. Underlying chronic myelomalacia as described above.   C4-5: Interval ACDF with improved patency of the spinal canal and foramina bilaterally. Mild foraminal narrowing bilaterally. No significant residual spinal stenosis.   C5-6: Interval ACDF with improved patency of the spinal canal and foramina bilaterally. Mild foraminal narrowing bilaterally. No significant residual spinal stenosis.   C6-7: Chronic spondylosis with loss of disc height, disc bulging and endplate osteophytes asymmetric to the right. Mild right-sided cord flattening and moderate to severe foraminal narrowing bilaterally are similar to previous MRI.   C7-T1: Chronic disc bulging with bilateral uncinate spurring and facet hypertrophy. No significant central spinal stenosis. Moderate right-greater-than-left foraminal narrowing appears similar to the previous  MRI.   Images demonstrate grossly stable spondylosis at T1-2 and T2-3 without resulting cord deformity or high-grade foraminal narrowing.   MRI LUMBAR SPINE FINDINGS   Segmentation: Conventional anatomy assumed, with the last open disc space designated L5-S1.Appears concordant with previous imaging.   Alignment:  Minimal degenerative anterolisthesis at L3-4 and L4-5.   Vertebrae: No worrisome osseous lesion, acute fracture or pars defect. The lumbar pedicles are somewhat short on a congenital basis. The visualized sacroiliac joints appear unremarkable.   Conus medullaris: Extends to the L1-2 level and appears normal.   Paraspinal and other soft tissues: No significant paraspinal findings.   Disc levels:   Sagittal images demonstrate disc bulging and endplate osteophytes at T11-12 with resulting mild left foraminal narrowing. No cord deformity. The T12-L1 level appears unremarkable.   L1-2: Preserved disc height and hydration. Mild facet hypertrophy. No spinal stenosis or nerve root encroachment.  L2-3: Preserved disc height with mild disc bulging eccentric to the left and mild to moderate facet and ligamentous hypertrophy. Mild left foraminal narrowing without definite nerve root encroachment.   L3-4: Mildly progressive loss of disc height with annular disc bulging and a broad-based right foraminal disc protrusion. Moderate facet and ligamentous hypertrophy. Resulting mild to moderate multifactorial spinal stenosis which appears mildly progressive. Moderate right foraminal narrowing also appears progressive with possible right L3 nerve root encroachment. Stable mild narrowing of the left foramen.   L4-5: Chronic loss of disc height with annular disc bulging and endplate osteophytes, mildly progressive from previous MRI. Advanced facet and ligamentous hypertrophy. Resulting progressive mild-to-moderate multifactorial spinal stenosis with moderate foraminal narrowing  bilaterally.   L5-S1: Relatively preserved disc height with mild disc bulging and asymmetric left-sided facet hypertrophy. The spinal canal and lateral recesses are patent. Mild foraminal narrowing bilaterally.   IMPRESSION: 1. Compared with outside MRI from 10/21/2020, the patient has undergone C3-6 ACDF. There is improved patency of the spinal canal and foramina at the operative levels. 2. Chronic myelomalacia at C3-4 without acute cord findings. 3. Progressive spondylosis at C2-3 with resulting increased spinal stenosis and left-sided cord flattening. Chronic left-greater-than-right foraminal narrowing. 4. Chronic spondylosis at C6-7 with resulting mild right-sided cord flattening and moderate to severe foraminal narrowing bilaterally, similar to previous MRI. 5. Mildly progressive spondylosis at L3-4 and L4-5. At L3-4, there is a broad-based right foraminal disc protrusion which may contribute to right L3 nerve root encroachment. At L4-5, there is progressive mild-to-moderate multifactorial spinal stenosis and moderate foraminal narrowing bilaterally. 6. No acute findings identified.     Electronically Signed   By: Carey Bullocks M.D.   On: 10/14/2022 16:39     Narrative & Impression  CLINICAL DATA:  For history: Status post cervical spinal fusion. Cervical spondylosis. Spinal stenosis in cervical region. Cervicalgia. Neck pain, chronic. Additional history provided by scanning technologist: Patient reports right arm weakness and numbness, neck pain while sleeping.   EXAM: CT CERVICAL SPINE WITHOUT CONTRAST   TECHNIQUE: Multidetector CT imaging of the cervical spine was performed without intravenous contrast. Multiplanar CT image reconstructions were also generated.   RADIATION DOSE REDUCTION: This exam was performed according to the departmental dose-optimization program which includes automated exposure control, adjustment of the mA and/or kV according to patient  size and/or use of iterative reconstruction technique.   COMPARISON:  Cervical spine MRI 10/01/2022.   FINDINGS: Alignment: No significant spondylolisthesis.   Skull base and vertebrae: The basion-dental and atlanto-dental intervals are maintained No evidence of a cervical spine fracture. C6-C7 ventral osteophyte.   Soft tissues and spinal canal: No paraspinal mass or collection.   Disc levels:   Unless otherwise stated, the level by level findings below have not significantly changed from the prior MRI of 10/01/2022.   Prior C3-C6 ACDF. Solid osseous fusion across the C5-C6 disc space. No appreciable solid bridging osseous fusion across the C4-C5 or C3-C4 disc spaces. Lucency surrounding portions of the bilateral C3 screws, suggesting hardware loosening (for instance as seen on series 4 images 38 and 41).   Disc space narrowing at the non-operative levels, greatest at C6-C7 (advanced) and T2-T3 (moderate-to-advanced).   Degenerative changes at the C1-C2 articulation.   C2-C3: Left center disc protrusion. Associated left-sided disc osteophyte ridge/uncinate hypertrophy. Uncovertebral hypertrophy also present on the right. Moderate spinal canal stenosis (greater on the left). Bilateral bony neural from narrowing (mild right, moderate/severe left).   C3-C4: Prior ACDF. Endplate spurring with bilateral  uncovertebral hypertrophy. Moderate spinal canal stenosis. Severe bilateral bony neural foraminal narrowing.   C4-C5: Prior ACDF. Endplate spurring. Bilateral acute hypertrophy. Facet spurring on the right. No significant spinal canal stenosis. Moderate right neural foraminal narrowing on the right.   C5-C6: Prior ACDF. Bilateral uncovertebral hypertrophy. Facet spurring on the right. No significant spinal canal stenosis. Mild relative bony neural foraminal narrowing on the right.   C6-C7: Disc bulge with endplate spurring and bilateral disc osteophyte ridge/uncinate  hypertrophy. Moderate spinal canal stenosis (greater on the right). Moderate/severe bilateral neural foraminal narrowing.   C7-T1: Disc bulge with bilateral disc osteophyte ridge. Mild spinal canal stenosis. Mild bilateral bony neural foraminal narrowing.   Upper chest: No consolidation within the imaged lung apices   Other: Subcentimeter nodule within the left thyroid lobe not meeting consensus criteria for ultrasound follow-up based on size. No follow-up imaging recommended. Reference: J Am Coll Radiol. 2015 Feb;12(2): 143-50.   IMPRESSION: 1. Prior C3-C6 ACDF. Solid osseous fusion across the C5-C6 disc space. No appreciable solid bridging osseous fusion across the C4-C5 or C3-C4 disc spaces. Lucency surrounding portions of the bilateral C3 screws suggesting hardware loosening. Moderate spinal canal stenosis at C3-C4. No significant spinal canal stenosis at the remaining operative levels. Bony neural foraminal narrowing bilaterally at C3-C4 (severe) and on the right at C4-C5 (moderate). 2. Cervical spondylosis at the remaining levels, not significant changed from the prior cervical spine MRI of 10/01/2022. At the non-operative levels, spinal canal stenosis is greatest at C2-C3 and C6-C7 (moderate at these levels). At the non-operative levels, foraminal stenosis is greatest on the left at C2-C3 and bilaterally at C6-C7 (moderate/severe at these sites). 3. Disc space narrowing is greatest at C6-C7 (advanced) and T2-T3 (moderate-to-advanced post pressure     Electronically Signed   By: Jackey Loge D.O.   On: 04/01/2023 08:45   He had cervical spine x-rays which show a proximal junctional kyphosis at the C6-7 level, also no appreciable fusion at the C3-4 C4-5 levels.  I have personally reviewed the images and agree with the above interpretation.  Medical Decision Making/Assessment and Plan: Mr. Windmiller is a pleasant 68 y.o. male with a history of cervical myelopathy and a C3  through C6 anterior cervical discectomy in 2022.  He had some residual weakness and numbness on the right but has felt like this has been progressing over the past multiple months.  He has been following conservatively with injections and evaluations.  He had a recent MRI and CT scan done as part of a workup by my teammates Drake Leach, the findings of those resulted in a consultation with myself.  When I saw him I was concerned about the proximal junctional kyphosis, pseudoarthrosis, progressive cervical myelopathy.  In this setting I was concerned about him needing surgery going forward.  He came in today with his spouse to ask more questions and discuss surgery.  We reviewed his imaging including his new x-rays which demonstrate that his neck is in a positive sagittal balance or in a deformity like posture.  Given the focal kyphosis at C6-7 we feel he would benefit from an anterior cervical discectomy, I would perform this with a stand-alone interbody cage with shims rather than screws to aid in the posterior portion of the procedure.  For the posterior portion of the procedure from C2-T2 I performed a posterior spinal fusion, there would likely be few levels with some facet cuts to aid in more extension.  He needs to be decompressed from C2-C4  where he continues to have stenosis likely causing his myelopathy progression.  We did discuss that this would be a major procedure, given his previous failure to fuse we would utilize bone morphogenic protein, the risks and benefits of this were discussed with him and his family.  We did discuss that this would take approximately 6 months to recover from as it was a major surgical intervention.  Notably he could continue to progress, as he would not be fused at the cervical cranial junction.  We also discussed that as with any spine surgery there is risk of paralysis, CSF leak, weakness numbness tingling, failure to improve his symptoms.    Thank you for involving me in  the care of this patient.    Lovenia Kim MD/MSCR Neurosurgery

## 2023-04-12 NOTE — H&P (View-Only) (Signed)
 Referring Physician:  [facility] [address] [place],  [place] [address]  Primary Physician:  [facility]  History of Present Illness: 04/12/2023 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  Patient has a history of hypertension, hep C, hyperlipidemia, glaucoma.  He had a C3-C6 ACDF on 12/26/2020.    He initially did well with his surgery, never regained full strength but did have improvement in his motor function.  Unfortunately he feels like he has had progressive weakness numbness tingling and loss of hand function especially over the past few months to year.  Has neck pain as position dependent and worsens while he is upright.  He says he gets severe back tiredness and soreness each day where he feels tightness in the back of his neck and cramping.  He also has weakness in his right lower extremity and right upper extremity.  He saw me recently to evaluate him for his progressive symptoms.  At that time I felt like he would likely require a surgical intervention for his proximal junctional kyphosis, and pseudoarthrosis resulting in a positive sagittal balance at the cervical spine, he wanted his family to be present for discussion so is here again in clinic today.  He has not had any major progression since we last saw him a few weeks ago   Conservative measures:  Physical therapy: none Multimodal medical therapy including regular antiinflammatories: tylenol, ultram, voltaren gel, mobic Injections:  11/12/22: Bilateral S1 TF ESI  05/16/2022: Bilateral S1 transforaminal ESI (50% relief) 11/22/2021: Right L5-S1 transforaminal ESI (75% relief) 07/04/2021: Left sacroiliac joint injection (no relief) 03/20/2021: Left sacroiliac joint injection (70% relief) 02/15/2020: Left SI joint injection at [facility] (good relief) 12/30/2019: Left L4-5 transforaminal ESI performed at [facility]     Past Surgery:  History of ACDF C3-C7 for myelopathy by Dr. Adriana Simas on 12/26/20  Review of Systems:  A 10 point review of systems is negative, except for the pertinent positives and negatives detailed in the HPI.  Past Medical History: Past Medical History:  Diagnosis Date   Allergy    Seasonal   BPH (benign prostatic hyperplasia)    Cervical myelopathy (HCC)    Chest wall mass 09/2022   Diabetes mellitus without complication (HCC)    Hepatitis C    History of kidney stones    Hyperlipidemia    Hypertension    Prediabetes    Primary open angle glaucoma (POAG) of left eye    Sepsis secondary to UTI [facility])     Past Surgical History: Past Surgical History:  Procedure Laterality Date   ANTERIOR CERVICAL DECOMPRESSION/DISCECTOMY FUSION 4 LEVELS N/A 12/26/2020   Procedure: C3-6 ANTERIOR CERVICAL DECOMPRESSION/DISCECTOMY FUSION 3 LEVELS;  Surgeon: Lucy Chris, MD;  Location: [facility] ORS;  Service: Neurosurgery;  Laterality: N/A;   BROW LIFT AND BLEPHAROPLASTY Bilateral 12/21/2019   CATARACT EXTRACTION W/ INTRAOCULAR LENS IMPLANT Left 02/05/2019   COLONOSCOPY WITH PROPOFOL N/A 09/29/2021   Procedure: COLONOSCOPY WITH PROPOFOL;  Surgeon: Regis Bill, MD;  Location: [facility] ENDOSCOPY;  Service: Endoscopy;  Laterality: N/A;   KNEE SURGERY Left 2002   Meniscus tear   LITHOTRIPSY  12/13/2020   MASS EXCISION N/A 10/11/2022   Procedure: EXCISION MASS CHEST WALL;  Surgeon: Sung Amabile, DO;  Location: [facility] ORS;  Service: General;  Laterality: N/A;   ORIF FEMORAL NECK FRACTURE W/ DHS Left 03/06/2020   SHOULDER ARTHROSCOPY WITH OPEN ROTATOR CUFF  REPAIR AND DISTAL CLAVICLE ACROMINECTOMY Left 07/25/2021   Procedure: SHOULDER ARTHROSCOPY WITH OPEN ROTATOR CUFF REPAIR AND DISTAL CLAVICLE ACROMINECTOMY;  Surgeon: Juanell Fairly, MD;  Location: [facility] ORS;  Service: Orthopedics;  Laterality: Left;   TOTAL HIP ARTHROPLASTY Left 03/06/2020    Allergies: Allergies as of 04/12/2023   (No Known  Allergies)    Medications:  Current Outpatient Medications:    alfuzosin (UROXATRAL) 10 MG 24 hr tablet, Take 1 tablet (10 mg total) by mouth 3 (three) times a week., Disp: 13 tablet, Rfl: 11   brimonidine (ALPHAGAN) 0.2 % ophthalmic solution, Place 1 drop into both eyes 2 (two) times daily., Disp: , Rfl:    dorzolamide-timolol (COSOPT) 22.3-6.8 MG/ML ophthalmic solution, Place 1 drop into both eyes 2 (two) times daily., Disp: , Rfl:    latanoprost (XALATAN) 0.005 % ophthalmic solution, Place 1 drop into both eyes at bedtime., Disp: , Rfl:    meloxicam (MOBIC) 15 MG tablet, Take 15 mg by mouth daily., Disp: , Rfl:    MOUNJARO 10 MG/0.5ML Pen, Inject 10 mg into the skin once a week., Disp: , Rfl:    nitrofurantoin, macrocrystal-monohydrate, (MACROBID) 100 MG capsule, SMARTSIG:1.0 Capsule(s) By Mouth Daily, Disp: , Rfl:    tadalafil (CIALIS) 5 MG tablet, Take 0.5 tablets (2.5 mg total) by mouth 3 (three) times a week., Disp: 7 tablet, Rfl: 11   traMADol (ULTRAM) 50 MG tablet, Take 1 tablet (50 mg total) by mouth every 6 (six) hours as needed., Disp: 6 tablet, Rfl: 0  Social History: Social History   Tobacco Use   Smoking status: Former    Current packs/day: 0.00    Average packs/day: 0.5 packs/day for 40.0 years (20.0 ttl pk-yrs)    Types: Cigarettes    Start date: 07/1979    Quit date: 07/2019    Years since quitting: 3.7   Smokeless tobacco: Never  Vaping Use   Vaping status: Never Used  Substance Use Topics   Alcohol use: No   Drug use: Not Currently    Types: Marijuana    Comment: History of cocaine use.     Family Medical History: Family History  Problem Relation Age of Onset   Heart disease Mother    Hypertension Mother    Diabetes Maternal Aunt    Cancer Maternal Uncle        Prostate   Diabetes Paternal Aunt     Physical Examination: Vitals:   04/12/23 0935  BP: 130/86    General: Patient is in no apparent distress. Attention to examination is  appropriate.  Neck:   Supple.  Forward carriage, limited extension  Respiratory: Patient is breathing without any difficulty.   NEUROLOGICAL:     Awake, alert, oriented to person, place, and time.  Speech is clear and fluent.   Cranial Nerves: Pupils equal round and reactive to light.  Facial tone is symmetric.  Facial sensation is symmetric. Shoulder shrug is symmetric. Tongue protrusion is midline.    Strength: Side Biceps Triceps Deltoid Interossei Grip Wrist Ext. Wrist Flex.  R 4 5 4 4 5 5 5   L 5 5 5 4 5 5 5    Side Iliopsoas Quads Hamstring PF DF EHL  R 4 5 5 4 4 5   L 5 5 5 4 4 5    He has hyperreflexia noted in the bilateral upper extremities right worse than left.  He has severe spreading of his biceps reflex triceps reflex and pectoralis reflex on the right, he is hyperreflexic  but not as severe on the left.  He has positive Hoffmann sign bilaterally.  He has crossed adductors as well in the lower extremities.  He has decreased sensation in significant most on the right  Gait is wide-based and unsteady,, unable to perform tandem gait.  Turns slowly states that he feels wobbly when walking and normal situations.  Imaging: Narrative & Impression  CLINICAL DATA:  Cervical radiculopathy and cervicalgia. History of cervical spine fusion. Right leg weakness.   EXAM: MRI CERVICAL AND LUMBAR SPINE WITHOUT CONTRAST   TECHNIQUE: Multiplanar and multiecho pulse sequences of the cervical spine, to include the craniocervical junction and cervicothoracic junction, and lumbar spine, were obtained without intravenous contrast.   COMPARISON:  Cervical spine radiographs 01/23/2022, cervical spine CT 11/01/2020. Outside MRI of the cervical spine 10/21/2020 and lumbar spine 03/05/2020 from [facility].   FINDINGS: MRI CERVICAL SPINE FINDINGS   Alignment: Straightening without focal angulation or significant listhesis.   Vertebrae: As shown on the most recent radiographs, the  patient has undergone C3-6 ACDF since the previous MRI and CT. Allowing for the artifact associated with the hardware, no evidence of acute fracture or traumatic subluxation. 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   Cord: There is chronic multilevel cord flattening, detailed at the individual levels below. Chronic cord atrophy and T2 hyperintensity at C3-4 consistent with myelomalacia, similar to previous MRI. No acute cord findings identified.   Posterior Fossa, vertebral arteries, paraspinal tissues: Visualized portions of the posterior fossa appear unremarkable.Bilateral vertebral artery flow voids. No significant paraspinal findings.   Disc levels:   C2-3: Progressive spondylosis with loss of disc height and an asymmetric left foraminal disc osteophyte complex. Chronic left greater than right foraminal narrowing. Interval increased spinal stenosis with increased left-sided cord flattening. Chronic severe left and mild right foraminal narrowing.   C3-4: Interval ACDF with moderate residual spinal stenosis and foraminal narrowing bilaterally. Underlying chronic myelomalacia as described above.   C4-5: Interval ACDF with improved patency of the spinal canal and foramina bilaterally. Mild foraminal narrowing bilaterally. No significant residual spinal stenosis.   C5-6: Interval ACDF with improved patency of the spinal canal and foramina bilaterally. Mild foraminal narrowing bilaterally. No significant residual spinal stenosis.   C6-7: Chronic spondylosis with loss of disc height, disc bulging and endplate osteophytes asymmetric to the right. Mild right-sided cord flattening and moderate to severe foraminal narrowing bilaterally are similar to previous MRI.   C7-T1: Chronic disc bulging with bilateral uncinate spurring and facet hypertrophy. No significant central spinal stenosis. Moderate right-greater-than-left foraminal narrowing appears similar to the previous  MRI.   Images demonstrate grossly stable spondylosis at T1-2 and T2-3 without resulting cord deformity or high-grade foraminal narrowing.   MRI LUMBAR SPINE FINDINGS   Segmentation: Conventional anatomy assumed, with the last open disc space designated L5-S1.Appears concordant with previous imaging.   Alignment:  Minimal degenerative anterolisthesis at L3-4 and L4-5.   Vertebrae: No worrisome osseous lesion, acute fracture or pars defect. The lumbar pedicles are somewhat short on a congenital basis. The visualized sacroiliac joints appear unremarkable.   Conus medullaris: Extends to the L1-2 level and appears normal.   Paraspinal and other soft tissues: No significant paraspinal findings.   Disc levels:   Sagittal images demonstrate disc bulging and endplate osteophytes at T11-12 with resulting mild left foraminal narrowing. No cord deformity. The T12-L1 level appears unremarkable.   L1-2: Preserved disc height and hydration. Mild facet hypertrophy. No spinal stenosis or nerve root encroachment.  L2-3: Preserved disc height with mild disc bulging eccentric to the left and mild to moderate facet and ligamentous hypertrophy. Mild left foraminal narrowing without definite nerve root encroachment.   L3-4: Mildly progressive loss of disc height with annular disc bulging and a broad-based right foraminal disc protrusion. Moderate facet and ligamentous hypertrophy. Resulting mild to moderate multifactorial spinal stenosis which appears mildly progressive. Moderate right foraminal narrowing also appears progressive with possible right L3 nerve root encroachment. Stable mild narrowing of the left foramen.   L4-5: Chronic loss of disc height with annular disc bulging and endplate osteophytes, mildly progressive from previous MRI. Advanced facet and ligamentous hypertrophy. Resulting progressive mild-to-moderate multifactorial spinal stenosis with moderate foraminal narrowing  bilaterally.   L5-S1: Relatively preserved disc height with mild disc bulging and asymmetric left-sided facet hypertrophy. The spinal canal and lateral recesses are patent. Mild foraminal narrowing bilaterally.   IMPRESSION: 1. Compared with outside MRI from 10/21/2020, the patient has undergone C3-6 ACDF. There is improved patency of the spinal canal and foramina at the operative levels. 2. Chronic myelomalacia at C3-4 without acute cord findings. 3. Progressive spondylosis at C2-3 with resulting increased spinal stenosis and left-sided cord flattening. Chronic left-greater-than-right foraminal narrowing. 4. Chronic spondylosis at C6-7 with resulting mild right-sided cord flattening and moderate to severe foraminal narrowing bilaterally, similar to previous MRI. 5. Mildly progressive spondylosis at L3-4 and L4-5. At L3-4, there is a broad-based right foraminal disc protrusion which may contribute to right L3 nerve root encroachment. At L4-5, there is progressive mild-to-moderate multifactorial spinal stenosis and moderate foraminal narrowing bilaterally. 6. No acute findings identified.     Electronically Signed   By: Carey Bullocks M.D.   On: 10/14/2022 16:39     Narrative & Impression  CLINICAL DATA:  For history: Status post cervical spinal fusion. Cervical spondylosis. Spinal stenosis in cervical region. Cervicalgia. Neck pain, chronic. Additional history provided by scanning technologist: Patient reports right arm weakness and numbness, neck pain while sleeping.   EXAM: CT CERVICAL SPINE WITHOUT CONTRAST   TECHNIQUE: Multidetector CT imaging of the cervical spine was performed without intravenous contrast. Multiplanar CT image reconstructions were also generated.   RADIATION DOSE REDUCTION: This exam was performed according to the departmental dose-optimization program which includes automated exposure control, adjustment of the mA and/or kV according to patient  size and/or use of iterative reconstruction technique.   COMPARISON:  Cervical spine MRI 10/01/2022.   FINDINGS: Alignment: No significant spondylolisthesis.   Skull base and vertebrae: The basion-dental and atlanto-dental intervals are maintained No evidence of a cervical spine fracture. C6-C7 ventral osteophyte.   Soft tissues and spinal canal: No paraspinal mass or collection.   Disc levels:   Unless otherwise stated, the level by level findings below have not significantly changed from the prior MRI of 10/01/2022.   Prior C3-C6 ACDF. Solid osseous fusion across the C5-C6 disc space. No appreciable solid bridging osseous fusion across the C4-C5 or C3-C4 disc spaces. Lucency surrounding portions of the bilateral C3 screws, suggesting hardware loosening (for instance as seen on series 4 images 38 and 41).   Disc space narrowing at the non-operative levels, greatest at C6-C7 (advanced) and T2-T3 (moderate-to-advanced).   Degenerative changes at the C1-C2 articulation.   C2-C3: Left center disc protrusion. Associated left-sided disc osteophyte ridge/uncinate hypertrophy. Uncovertebral hypertrophy also present on the right. Moderate spinal canal stenosis (greater on the left). Bilateral bony neural from narrowing (mild right, moderate/severe left).   C3-C4: Prior ACDF. Endplate spurring with bilateral  uncovertebral hypertrophy. Moderate spinal canal stenosis. Severe bilateral bony neural foraminal narrowing.   C4-C5: Prior ACDF. Endplate spurring. Bilateral acute hypertrophy. Facet spurring on the right. No significant spinal canal stenosis. Moderate right neural foraminal narrowing on the right.   C5-C6: Prior ACDF. Bilateral uncovertebral hypertrophy. Facet spurring on the right. No significant spinal canal stenosis. Mild relative bony neural foraminal narrowing on the right.   C6-C7: Disc bulge with endplate spurring and bilateral disc osteophyte ridge/uncinate  hypertrophy. Moderate spinal canal stenosis (greater on the right). Moderate/severe bilateral neural foraminal narrowing.   C7-T1: Disc bulge with bilateral disc osteophyte ridge. Mild spinal canal stenosis. Mild bilateral bony neural foraminal narrowing.   Upper chest: No consolidation within the imaged lung apices   Other: Subcentimeter nodule within the left thyroid lobe not meeting consensus criteria for ultrasound follow-up based on size. No follow-up imaging recommended. Reference: J Am Coll Radiol. 2015 Feb;12(2): 143-50.   IMPRESSION: 1. Prior C3-C6 ACDF. Solid osseous fusion across the C5-C6 disc space. No appreciable solid bridging osseous fusion across the C4-C5 or C3-C4 disc spaces. Lucency surrounding portions of the bilateral C3 screws suggesting hardware loosening. Moderate spinal canal stenosis at C3-C4. No significant spinal canal stenosis at the remaining operative levels. Bony neural foraminal narrowing bilaterally at C3-C4 (severe) and on the right at C4-C5 (moderate). 2. Cervical spondylosis at the remaining levels, not significant changed from the prior cervical spine MRI of 10/01/2022. At the non-operative levels, spinal canal stenosis is greatest at C2-C3 and C6-C7 (moderate at these levels). At the non-operative levels, foraminal stenosis is greatest on the left at C2-C3 and bilaterally at C6-C7 (moderate/severe at these sites). 3. Disc space narrowing is greatest at C6-C7 (advanced) and T2-T3 (moderate-to-advanced post pressure     Electronically Signed   By: Jackey Loge D.O.   On: 04/01/2023 08:45   He had cervical spine x-rays which show a proximal junctional kyphosis at the C6-7 level, also no appreciable fusion at the C3-4 C4-5 levels.  I have personally reviewed the images and agree with the above interpretation.  Medical Decision Making/Assessment and Plan: Mr. Windmiller is a pleasant 68 y.o. male with a history of cervical myelopathy and a C3  through C6 anterior cervical discectomy in 2022.  He had some residual weakness and numbness on the right but has felt like this has been progressing over the past multiple months.  He has been following conservatively with injections and evaluations.  He had a recent MRI and CT scan done as part of a workup by my teammates Drake Leach, the findings of those resulted in a consultation with myself.  When I saw him I was concerned about the proximal junctional kyphosis, pseudoarthrosis, progressive cervical myelopathy.  In this setting I was concerned about him needing surgery going forward.  He came in today with his spouse to ask more questions and discuss surgery.  We reviewed his imaging including his new x-rays which demonstrate that his neck is in a positive sagittal balance or in a deformity like posture.  Given the focal kyphosis at C6-7 we feel he would benefit from an anterior cervical discectomy, I would perform this with a stand-alone interbody cage with shims rather than screws to aid in the posterior portion of the procedure.  For the posterior portion of the procedure from C2-T2 I performed a posterior spinal fusion, there would likely be few levels with some facet cuts to aid in more extension.  He needs to be decompressed from C2-C4  where he continues to have stenosis likely causing his myelopathy progression.  We did discuss that this would be a major procedure, given his previous failure to fuse we would utilize bone morphogenic protein, the risks and benefits of this were discussed with him and his family.  We did discuss that this would take approximately 6 months to recover from as it was a major surgical intervention.  Notably he could continue to progress, as he would not be fused at the cervical cranial junction.  We also discussed that as with any spine surgery there is risk of paralysis, CSF leak, weakness numbness tingling, failure to improve his symptoms.    Thank you for involving me in  the care of this patient.    Lovenia Kim MD/MSCR Neurosurgery

## 2023-04-12 NOTE — Patient Instructions (Signed)
 Please see below for information in regards to your upcoming surgery:   Planned surgery: C6-7 anterior cervical discectomy and fusion, C2-T2 posterior spinal fusion, C2-C4 laminectomy   Surgery date: 04/30/23 at [facility] (Medical Mall: [address] [place], [place] [address]) - you will find out your arrival time the business day before your surgery.   Pre-op appointment at [facility] Pre-admit Testing: we will call you with a date/time for this. If you are scheduled for an in person appointment, Pre-admit Testing is located on the first floor of the [facility] building, [address] [facility], [address]. Please bring all prescriptions in the original prescription bottles to your appointment. During this appointment, they will advise you which medications you can take the morning of surgery, and which medications you will need to hold for surgery. Labs (such as blood work, EKG) may be done at your pre-op appointment. You are not required to fast for these labs. Should you need to change your pre-op appointment, please call Pre-admit testing at [phone].     Diabetes medications: Per anesthesia guidelines (due to the increased risk of aspiration caused by delayed gastric emptying): Tirzepatide (Mounjaro, Zepbound): hold for 7 days prior to surgery     NSAIDS (Non-steroidal anti-inflammatory drugs): because you are having a fusion, please avoid taking any NSAIDS (examples: ibuprofen, motrin, aleve, naproxen, meloxicam, diclofenac) for 3 months after surgery. Celebrex is an exception and is OK to take, if prescribed. Tylenol is not an NSAID.    Common restrictions after surgery: No bending, lifting, or twisting ("BLT"). Avoid lifting objects heavier than 10 pounds for the first 6 weeks after surgery. Where possible, avoid household activities that involve lifting, bending, reaching, pushing, or pulling such as laundry, vacuuming, grocery shopping, and  childcare. Try to arrange for help from friends and family for these activities while you heal. Do not drive while taking prescription pain medication. Weeks 6 through 12 after surgery: avoid lifting more than 25 pounds.    X-rays after surgery: Because you are having a fusion or arthroplasty: for appointments after your 2 week follow-up: please arrive at the [facility] outpatient imaging center ([address], [place]) or [facility] one hour prior to your appointment for x-rays. This applies to every appointment after your 2 week follow-up. Failure to do so may result in your appointment being rescheduled.   How to contact us:  If you have any questions/concerns before or after surgery, you can reach [place] at [phone], or you can send a mychart message. We can be reached by phone or mychart 8am-4pm, Monday-Friday.  *Please note: Calls after 4pm are forwarded to a third party answering service. Mychart messages are not routinely monitored during evenings, weekends, and holidays. Please call our office to contact the answering service for urgent concerns during non-business hours.   If you have FMLA/disability paperwork, please drop it off or fax it to [phone], attention Patty.   Appointments/FMLA & disability paperwork: Joycelyn Rua, & Flonnie Hailstone Registered Nurse/Surgery scheduler: Royston Cowper Medical Assistants: Nash Mantis Physician Assistants: Joan Flores, PA-C, Manning Charity, PA-C & Drake Leach, PA-C Surgeons: Venetia Night, MD & Ernestine Mcmurray, MD

## 2023-04-19 ENCOUNTER — Other Ambulatory Visit: Payer: Self-pay

## 2023-04-19 ENCOUNTER — Encounter
Admission: RE | Admit: 2023-04-19 | Discharge: 2023-04-19 | Disposition: A | Discharge status: Home / Self Care | Payer: Medicare (Managed Care) | Source: Ambulatory Visit | Attending: Neurosurgery | Admitting: Neurosurgery

## 2023-04-19 VITALS — BP 126/78 | HR 79 | Resp 16 | Ht 67.0 in | Wt 230.8 lb

## 2023-04-19 DIAGNOSIS — Z01818 Encounter for other preprocedural examination: Secondary | ICD-10-CM | POA: Diagnosis present

## 2023-04-19 DIAGNOSIS — E119 Type 2 diabetes mellitus without complications: Secondary | ICD-10-CM | POA: Diagnosis not present

## 2023-04-19 DIAGNOSIS — I10 Essential (primary) hypertension: Secondary | ICD-10-CM | POA: Insufficient documentation

## 2023-04-19 DIAGNOSIS — Z01812 Encounter for preprocedural laboratory examination: Secondary | ICD-10-CM

## 2023-04-19 HISTORY — DX: Spondylosis without myelopathy or radiculopathy, cervical region: M47.812

## 2023-04-19 HISTORY — DX: Fracture of neck, unspecified, initial encounter: S12.9XXA

## 2023-04-19 HISTORY — DX: Other secondary kyphosis, cervical region: M40.12

## 2023-04-19 HISTORY — DX: Other spondylosis with myelopathy, cervical region: M47.12

## 2023-04-19 HISTORY — DX: Anemia, unspecified: D64.9

## 2023-04-19 LAB — CBC
HCT: 35 % — ABNORMAL LOW (ref 39.0–52.0)
Hemoglobin: 11.8 g/dL — ABNORMAL LOW (ref 13.0–17.0)
MCH: 28.6 pg (ref 26.0–34.0)
MCHC: 33.7 g/dL (ref 30.0–36.0)
MCV: 84.7 fL (ref 80.0–100.0)
Platelets: 201 10*3/uL (ref 150–400)
RBC: 4.13 MIL/uL — ABNORMAL LOW (ref 4.22–5.81)
RDW: 13.2 % (ref 11.5–15.5)
WBC: 5.9 10*3/uL (ref 4.0–10.5)
nRBC: 0 % (ref 0.0–0.2)

## 2023-04-19 LAB — SURGICAL PCR SCREEN
MRSA, PCR: NEGATIVE
Staphylococcus aureus: NEGATIVE

## 2023-04-19 LAB — TYPE AND SCREEN
ABO/RH(D): AB POS
Antibody Screen: NEGATIVE

## 2023-04-19 NOTE — Patient Instructions (Addendum)
 Your procedure is scheduled on: 04/30/23 - Tuesday Report to the Registration Desk on the 1st floor of the [facility]. To find out your arrival time, please call [phone] between 1PM - 3PM on: 04/29/23 - Monday If your arrival time is 6:00 am, do not arrive before that time as the [facility] entrance doors do not open until 6:00 am.  REMEMBER: Instructions that are not followed completely may result in serious medical risk, up to and including death; or upon the discretion of your surgeon and anesthesiologist your surgery may need to be rescheduled.  Do not eat food after midnight the night before surgery.  No gum chewing or hard candies.  You may drink water up to 2 hours before you are scheduled to arrive for your surgery. Do not drink anything within 2 hours of your scheduled arrival time.   NSAIDS (Non-steroidal anti-inflammatory drugs): because you are having a fusion, please avoid taking any NSAIDS (examples: ibuprofen, BC HEADACHE POWDER, motrin, aleve, naproxen, meloxicam, diclofenac) for 3 months after surgery. Celebrex is an exception and is OK to take, if prescribed. Tylenol is not an NSAID. You may take Tylenol if needed for pain up until the day of surgery.  Stop ANY OVER THE COUNTER supplements until after surgery. Multivitamin  HOLD Tirzepatide [facility]): hold for 7 days prior to surgery   HOLD tadalafil (CIALIS) beginning 04/28/23.  ON THE DAY OF SURGERY ONLY TAKE THESE MEDICATIONS WITH SIPS OF WATER:  nitrofurantoin  dorzolamide-timolol (COSOPT)  alfuzosin (UROXATRAL)    No Alcohol for 24 hours before or after surgery.  No Smoking including e-cigarettes for 24 hours before surgery.  No chewable tobacco products for at least 6 hours before surgery.  No nicotine patches on the day of surgery.  Do not use any "recreational" drugs for at least a week (preferably 2 weeks) before your surgery.  Please be advised that the combination of cocaine and anesthesia  may have negative outcomes, up to and including death. If you test positive for cocaine, your surgery will be cancelled.  On the morning of surgery brush your teeth with toothpaste and water, you may rinse your mouth with mouthwash if you wish. Do not swallow any toothpaste or mouthwash.  Use CHG Soap or wipes as directed on instruction sheet.  Do not wear jewelry, make-up, hairpins, clips or nail polish.  For welded (permanent) jewelry: bracelets, anklets, waist bands, etc.  Please have this removed prior to surgery.  If it is not removed, there is a chance that hospital personnel will need to cut it off on the day of surgery.  Do not wear lotions, powders, or perfumes.   Do not shave body hair from the neck down 48 hours before surgery.  Contact lenses, hearing aids and dentures may not be worn into surgery.  Do not bring valuables to the hospital. [facility] is not responsible for any missing/lost belongings or valuables.   Notify your doctor if there is any change in your medical condition (cold, fever, infection).  Wear comfortable clothing (specific to your surgery type) to the hospital.  After surgery, you can help prevent lung complications by doing breathing exercises.  Take deep breaths and cough every 1-2 hours. Your doctor may order a device called an Incentive Spirometer to help you take deep breaths. When coughing or sneezing, hold a pillow firmly against your incision with both hands. This is called "splinting." Doing this helps protect your incision. It also decreases belly discomfort.  If  you are being admitted to the hospital overnight, leave your suitcase in the car. After surgery it may be brought to your room.  In case of increased patient census, it may be necessary for you, the patient, to continue your postoperative care in the Same Day Surgery department.  If you are being discharged the day of surgery, you will not be allowed to drive home. You will need a  responsible individual to drive you home and stay with you for 24 hours after surgery.   If you are taking public transportation, you will need to have a responsible individual with you.  Please call the Pre-admissions Testing Dept. at [phone] if you have any questions about these instructions.  Surgery Visitation Policy:  Patients having surgery or a procedure may have two visitors.  Children under the age of 39 must have an adult with them who is not the patient.  Temporary Visitor Restrictions Due to increasing cases of flu, RSV and COVID-19: Children ages 97 and under will not be able to visit patients in [facility] hospitals under most circumstances.  Inpatient Visitation:    Visiting hours are 7 a.m. to 8 p.m. Up to four visitors are allowed at one time in a patient room. The visitors may rotate out with other people during the day.  One visitor age 15 or older may stay with the patient overnight and must be in the room by 8 p.m.    Pre-operative 5 CHG Bath Instructions   You can play a key role in reducing the risk of infection after surgery. Your skin needs to be as free of germs as possible. You can reduce the number of germs on your skin by washing with CHG (chlorhexidine gluconate) soap before surgery. CHG is an antiseptic soap that kills germs and continues to kill germs even after washing.   DO NOT use if you have an allergy to chlorhexidine/CHG or antibacterial soaps. If your skin becomes reddened or irritated, stop using the CHG and notify one of our RNs at [phone].   Please shower with the CHG soap starting 4 days before surgery using the following schedule:   03/07 - 03/11.    Please keep in mind the following:  DO NOT shave, including legs and underarms, starting the day of your first shower.   You may shave your face at any point before/day of surgery.  Place clean sheets on your bed the day you start using CHG soap. Use a clean washcloth (not  used since being washed) for each shower. DO NOT sleep with pets once you start using the CHG.   CHG Shower Instructions:  If you choose to wash your hair and private area, wash first with your normal shampoo/soap.  After you use shampoo/soap, rinse your hair and body thoroughly to remove shampoo/soap residue.  Turn the water OFF and apply about 3 tablespoons (45 ml) of CHG soap to a CLEAN washcloth.  Apply CHG soap ONLY FROM YOUR NECK DOWN TO YOUR TOES (washing for 3-5 minutes)  DO NOT use CHG soap on face, private areas, open wounds, or sores.  Pay special attention to the area where your surgery is being performed.  If you are having back surgery, having someone wash your back for you may be helpful. Wait 2 minutes after CHG soap is applied, then you may rinse off the CHG soap.  Pat dry with a clean towel  Put on clean clothes/pajamas   If you choose to wear  lotion, please use ONLY the CHG-compatible lotions on the back of this paper.     Additional instructions for the day of surgery: DO NOT APPLY any lotions, deodorants, cologne, or perfumes.   Put on clean/comfortable clothes.  Brush your teeth.  Ask your nurse before applying any prescription medications to the skin.      CHG Compatible Lotions   Aveeno Moisturizing lotion  Cetaphil Moisturizing Cream  Cetaphil Moisturizing Lotion  Clairol Herbal Essence Moisturizing Lotion, Dry Skin  Clairol Herbal Essence Moisturizing Lotion, Extra Dry Skin  Clairol Herbal Essence Moisturizing Lotion, Normal Skin  Curel Age Defying Therapeutic Moisturizing Lotion with Alpha Hydroxy  Curel Extreme Care Body Lotion  Curel Soothing Hands Moisturizing Hand Lotion  Curel Therapeutic Moisturizing Cream, Fragrance-Free  Curel Therapeutic Moisturizing Lotion, Fragrance-Free  Curel Therapeutic Moisturizing Lotion, Original Formula  Eucerin Daily Replenishing Lotion  Eucerin Dry Skin Therapy Plus Alpha Hydroxy Crme  Eucerin Dry Skin Therapy  Plus Alpha Hydroxy Lotion  Eucerin Original Crme  Eucerin Original Lotion  Eucerin Plus Crme Eucerin Plus Lotion  Eucerin TriLipid Replenishing Lotion  Keri Anti-Bacterial Hand Lotion  Keri Deep Conditioning Original Lotion Dry Skin Formula Softly Scented  Keri Deep Conditioning Original Lotion, Fragrance Free Sensitive Skin Formula  Keri Lotion Fast Absorbing Fragrance Free Sensitive Skin Formula  Keri Lotion Fast Absorbing Softly Scented Dry Skin Formula  Keri Original Lotion  Keri Skin Renewal Lotion Keri Silky Smooth Lotion  Keri Silky Smooth Sensitive Skin Lotion  Nivea Body Creamy Conditioning Oil  Nivea Body Extra Enriched Teacher, adult education Moisturizing Lotion Nivea Crme  Nivea Skin Firming Lotion  NutraDerm 30 Skin Lotion  NutraDerm Skin Lotion  NutraDerm Therapeutic Skin Cream  NutraDerm Therapeutic Skin Lotion  ProShield Protective Hand Cream  Provon moisturizing lotion

## 2023-04-24 NOTE — Progress Notes (Signed)
  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 In review of his medication reconciliation it was noted that patient is on a prescribed GLP-1 medication. Per guidelines issued by the American Society of Anesthesiologists (ASA), it is recommended that these medications be held for 7 days prior to the patient undergoing any type of elective surgical procedure. The patient is taking the following GLP-1 medication:  []  SEMAGLUTIDE   []  EXENATIDE  []  LIRAGLUTIDE   []  LIXISENATIDE  []  DULAGLUTIDE     [x]  TIRZEPATIDE (GLP-1/GIP)  Reached out to prescribing provider Sampson Goon, MD) to make them aware of the guidelines from anesthesia. Given that this patient takes the prescribed GLP-1 medication for his  diabetes diagnosis, rather than for weight loss, recommendations from the prescribing provider were solicited. Prescribing provider made aware of the following so that informed decision/POC can be developed for this patient that may be taking medications belonging to these drug classes:  Oral GLP-1 medications will be held 1 day prior to surgery.  Injectable GLP-1 medications will be held 7 days prior to surgery.  Metformin is routinely held 48  hours prior to surgery due to renal concerns, potential need for contrasted imaging perioperatively, and the potential for tissue hypoxia leading to drug induced lactic acidosis.  All SGLT2i medications are held 72 hours prior to surgery as they can be associated with the increased potential for developing euglycemic diabetic ketoacidosis (EDKA).   Impression and Plan:  Caleb Wall is on a prescribed GLP-1 medication, which induces the known side effect of decreased gastric emptying. Efforts are bring made to mitigate the risk of perioperative hyperglycemic events, as elevated blood glucose levels have been found to contribute to intra/postoperative complications. Additionally, hyperglycemic extremes can potentially necessitate the postponing of a patient's elective case in order to better optimize perioperative glycemic control, again with the aforementioned guidelines in place. With this in mind, recommendations have been sought from the prescribing provider, who has cleared patient to proceed with holding the prescribed GLP-1/GIP as per the guidelines from the ASA.   Provider recommending: no further recommendations received from the prescribing provider.  Copy of signed clearance and recommendations placed on patient's chart for inclusion in their medical record and for review by the surgical/anesthetic team on the day of his procedure.   Quentin Mulling, MSN, APRN, FNP-C, CEN [facility]  Perioperative Services Nurse Practitioner Phone: [phone] Fax: [phone] 04/24/23 4:55 PM  NOTE: This note has been prepared using Dragon dictation software. Despite my best ability to proofread, there is always the potential that unintentional transcriptional errors may still occur from this process.

## 2023-04-29 MED ORDER — ORAL CARE MOUTH RINSE: 15.0000 mL | Freq: Once | OROMUCOSAL | Status: AC

## 2023-04-29 MED ORDER — SODIUM CHLORIDE 0.9% FLUSH: 3.0000 mL | INTRAVENOUS | Status: DC | PRN

## 2023-04-29 MED ORDER — CEFAZOLIN SODIUM-DEXTROSE 2-4 GM/100ML-% IV SOLN
2.0000 g | INTRAVENOUS | Status: AC
  Administered 2023-04-30 (×2): 2 g via INTRAVENOUS

## 2023-04-29 MED ORDER — CHLORHEXIDINE GLUCONATE 0.12 % MT SOLN
15.0000 mL | Freq: Once | OROMUCOSAL | Status: AC
  Administered 2023-04-30: 15 mL via OROMUCOSAL

## 2023-04-29 MED ORDER — CEFAZOLIN IN SODIUM CHLORIDE 2-0.9 GM/100ML-% IV SOLN
2.0000 g | Freq: Once | INTRAVENOUS | Status: DC
  Filled 2023-04-29: qty 100

## 2023-04-29 MED ORDER — SODIUM CHLORIDE 0.9% FLUSH: 3.0000 mL | Freq: Two times a day (BID) | INTRAVENOUS | Status: DC

## 2023-04-30 ENCOUNTER — Inpatient Hospital Stay: Payer: Medicare (Managed Care) | Admitting: Urgent Care

## 2023-04-30 ENCOUNTER — Encounter: Payer: Self-pay | Admitting: Neurosurgery

## 2023-04-30 ENCOUNTER — Inpatient Hospital Stay: Payer: Medicare (Managed Care)

## 2023-04-30 ENCOUNTER — Other Ambulatory Visit: Payer: Self-pay

## 2023-04-30 ENCOUNTER — Encounter
Admission: RE | Disposition: A | Discharge status: Other Acute Inpatient Hospital | Payer: Self-pay | Source: Home / Self Care | Attending: Neurosurgery

## 2023-04-30 ENCOUNTER — Inpatient Hospital Stay
Admission: RE | Admit: 2023-04-30 | Discharge: 2023-05-03 | DRG: 430 | Disposition: A | Discharge status: Other Acute Inpatient Hospital | Payer: Medicare (Managed Care) | Attending: Neurosurgery | Admitting: Neurosurgery

## 2023-04-30 DIAGNOSIS — Y838 Other surgical procedures as the cause of abnormal reaction of the patient, or of later complication, without mention of misadventure at the time of the procedure: Secondary | ICD-10-CM | POA: Diagnosis not present

## 2023-04-30 DIAGNOSIS — Z79899 Other long term (current) drug therapy: Secondary | ICD-10-CM

## 2023-04-30 DIAGNOSIS — Z96642 Presence of left artificial hip joint: Secondary | ICD-10-CM | POA: Diagnosis present

## 2023-04-30 DIAGNOSIS — Z7985 Long-term (current) use of injectable non-insulin antidiabetic drugs: Secondary | ICD-10-CM | POA: Diagnosis not present

## 2023-04-30 DIAGNOSIS — M4802 Spinal stenosis, cervical region: Secondary | ICD-10-CM | POA: Diagnosis present

## 2023-04-30 DIAGNOSIS — E1165 Type 2 diabetes mellitus with hyperglycemia: Secondary | ICD-10-CM | POA: Diagnosis present

## 2023-04-30 DIAGNOSIS — E785 Hyperlipidemia, unspecified: Secondary | ICD-10-CM | POA: Diagnosis present

## 2023-04-30 DIAGNOSIS — G8389 Other specified paralytic syndromes: Secondary | ICD-10-CM | POA: Diagnosis not present

## 2023-04-30 DIAGNOSIS — M960 Pseudarthrosis after fusion or arthrodesis: Secondary | ICD-10-CM | POA: Diagnosis present

## 2023-04-30 DIAGNOSIS — R739 Hyperglycemia, unspecified: Secondary | ICD-10-CM | POA: Diagnosis not present

## 2023-04-30 DIAGNOSIS — I10 Essential (primary) hypertension: Secondary | ICD-10-CM | POA: Diagnosis present

## 2023-04-30 DIAGNOSIS — E669 Obesity, unspecified: Secondary | ICD-10-CM | POA: Diagnosis present

## 2023-04-30 DIAGNOSIS — M4712 Other spondylosis with myelopathy, cervical region: Secondary | ICD-10-CM | POA: Diagnosis present

## 2023-04-30 DIAGNOSIS — G9581 Conus medullaris syndrome: Secondary | ICD-10-CM | POA: Diagnosis present

## 2023-04-30 DIAGNOSIS — G9589 Other specified diseases of spinal cord: Secondary | ICD-10-CM | POA: Diagnosis present

## 2023-04-30 DIAGNOSIS — Y92239 Unspecified place in hospital as the place of occurrence of the external cause: Secondary | ICD-10-CM | POA: Diagnosis not present

## 2023-04-30 DIAGNOSIS — D62 Acute posthemorrhagic anemia: Secondary | ICD-10-CM | POA: Diagnosis not present

## 2023-04-30 DIAGNOSIS — Z87891 Personal history of nicotine dependence: Secondary | ICD-10-CM

## 2023-04-30 DIAGNOSIS — M5001 Cervical disc disorder with myelopathy,  high cervical region: Secondary | ICD-10-CM | POA: Diagnosis present

## 2023-04-30 DIAGNOSIS — G9782 Other postprocedural complications and disorders of nervous system: Secondary | ICD-10-CM | POA: Diagnosis not present

## 2023-04-30 DIAGNOSIS — E119 Type 2 diabetes mellitus without complications: Secondary | ICD-10-CM | POA: Diagnosis not present

## 2023-04-30 DIAGNOSIS — Z981 Arthrodesis status: Secondary | ICD-10-CM

## 2023-04-30 DIAGNOSIS — R292 Abnormal reflex: Secondary | ICD-10-CM | POA: Diagnosis present

## 2023-04-30 DIAGNOSIS — M4722 Other spondylosis with radiculopathy, cervical region: Secondary | ICD-10-CM | POA: Diagnosis present

## 2023-04-30 DIAGNOSIS — K5900 Constipation, unspecified: Secondary | ICD-10-CM | POA: Diagnosis present

## 2023-04-30 DIAGNOSIS — Z8042 Family history of malignant neoplasm of prostate: Secondary | ICD-10-CM

## 2023-04-30 DIAGNOSIS — H401120 Primary open-angle glaucoma, left eye, stage unspecified: Secondary | ICD-10-CM | POA: Diagnosis present

## 2023-04-30 DIAGNOSIS — T84038A Mechanical loosening of other internal prosthetic joint, initial encounter: Secondary | ICD-10-CM | POA: Diagnosis present

## 2023-04-30 DIAGNOSIS — R29898 Other symptoms and signs involving the musculoskeletal system: Secondary | ICD-10-CM | POA: Diagnosis not present

## 2023-04-30 DIAGNOSIS — Y831 Surgical operation with implant of artificial internal device as the cause of abnormal reaction of the patient, or of later complication, without mention of misadventure at the time of the procedure: Secondary | ICD-10-CM | POA: Diagnosis present

## 2023-04-30 DIAGNOSIS — Z87440 Personal history of urinary (tract) infections: Secondary | ICD-10-CM

## 2023-04-30 DIAGNOSIS — M2578 Osteophyte, vertebrae: Secondary | ICD-10-CM | POA: Diagnosis present

## 2023-04-30 DIAGNOSIS — Z87442 Personal history of urinary calculi: Secondary | ICD-10-CM

## 2023-04-30 DIAGNOSIS — Z6836 Body mass index (BMI) 36.0-36.9, adult: Secondary | ICD-10-CM

## 2023-04-30 DIAGNOSIS — Z713 Dietary counseling and surveillance: Secondary | ICD-10-CM

## 2023-04-30 DIAGNOSIS — Z833 Family history of diabetes mellitus: Secondary | ICD-10-CM

## 2023-04-30 DIAGNOSIS — R3911 Hesitancy of micturition: Secondary | ICD-10-CM | POA: Diagnosis not present

## 2023-04-30 DIAGNOSIS — Z791 Long term (current) use of non-steroidal anti-inflammatories (NSAID): Secondary | ICD-10-CM

## 2023-04-30 DIAGNOSIS — N400 Enlarged prostate without lower urinary tract symptoms: Secondary | ICD-10-CM | POA: Diagnosis present

## 2023-04-30 DIAGNOSIS — Z7982 Long term (current) use of aspirin: Secondary | ICD-10-CM

## 2023-04-30 DIAGNOSIS — Z01818 Encounter for other preprocedural examination: Secondary | ICD-10-CM

## 2023-04-30 DIAGNOSIS — N401 Enlarged prostate with lower urinary tract symptoms: Secondary | ICD-10-CM | POA: Diagnosis not present

## 2023-04-30 DIAGNOSIS — Z961 Presence of intraocular lens: Secondary | ICD-10-CM | POA: Diagnosis present

## 2023-04-30 DIAGNOSIS — D72829 Elevated white blood cell count, unspecified: Secondary | ICD-10-CM | POA: Diagnosis not present

## 2023-04-30 DIAGNOSIS — K5901 Slow transit constipation: Secondary | ICD-10-CM | POA: Diagnosis not present

## 2023-04-30 DIAGNOSIS — G959 Disease of spinal cord, unspecified: Secondary | ICD-10-CM | POA: Diagnosis present

## 2023-04-30 DIAGNOSIS — S129XXA Fracture of neck, unspecified, initial encounter: Principal | ICD-10-CM | POA: Diagnosis present

## 2023-04-30 DIAGNOSIS — Z8619 Personal history of other infectious and parasitic diseases: Secondary | ICD-10-CM

## 2023-04-30 DIAGNOSIS — Z9889 Other specified postprocedural states: Secondary | ICD-10-CM

## 2023-04-30 DIAGNOSIS — Z8249 Family history of ischemic heart disease and other diseases of the circulatory system: Secondary | ICD-10-CM

## 2023-04-30 DIAGNOSIS — Z9842 Cataract extraction status, left eye: Secondary | ICD-10-CM

## 2023-04-30 DIAGNOSIS — Z794 Long term (current) use of insulin: Secondary | ICD-10-CM | POA: Diagnosis not present

## 2023-04-30 HISTORY — PX: ANTERIOR CERVICAL DECOMP/DISCECTOMY FUSION: SHX1161

## 2023-04-30 HISTORY — PX: POSTERIOR CERVICAL LAMINECTOMY: SHX2248

## 2023-04-30 HISTORY — PX: POSTERIOR CERVICAL FUSION/FORAMINOTOMY: SHX5038

## 2023-04-30 LAB — GLUCOSE, CAPILLARY
Glucose-Capillary: 87 mg/dL (ref 70–99)
Glucose-Capillary: 143 mg/dL — ABNORMAL HIGH (ref 70–99)

## 2023-04-30 SURGERY — ANTERIOR CERVICAL DECOMPRESSION/DISCECTOMY FUSION 1 LEVEL
Anesthesia: General

## 2023-04-30 MED ORDER — DROPERIDOL 2.5 MG/ML IJ SOLN: 0.6250 mg | Freq: Once | INTRAMUSCULAR | Status: DC | PRN

## 2023-04-30 MED ORDER — DORZOLAMIDE HCL-TIMOLOL MAL 2-0.5 % OP SOLN
1.0000 [drp] | Freq: Two times a day (BID) | OPHTHALMIC | Status: DC
  Administered 2023-04-30 – 2023-05-03 (×6): 1 [drp] via OPHTHALMIC
  Filled 2023-04-30: qty 10

## 2023-04-30 MED ORDER — PROPOFOL 10 MG/ML IV BOLUS
INTRAVENOUS | Status: AC
  Filled 2023-04-30: qty 40

## 2023-04-30 MED ORDER — SENNOSIDES-DOCUSATE SODIUM 8.6-50 MG PO TABS: 1.0000 | ORAL_TABLET | Freq: Every evening | ORAL | Status: DC | PRN

## 2023-04-30 MED ORDER — ACETAMINOPHEN 10 MG/ML IV SOLN
INTRAVENOUS | Status: DC | PRN
  Administered 2023-04-30: 1000 mg via INTRAVENOUS

## 2023-04-30 MED ORDER — SURGIFLO WITH THROMBIN (HEMOSTATIC MATRIX KIT) OPTIME
TOPICAL | Status: DC | PRN
Start: 2023-04-30 — End: 2023-04-30
  Administered 2023-04-30 (×3): 1 via TOPICAL

## 2023-04-30 MED ORDER — ONDANSETRON HCL 4 MG/2ML IJ SOLN
INTRAMUSCULAR | Status: DC | PRN
  Administered 2023-04-30: 4 mg via INTRAVENOUS

## 2023-04-30 MED ORDER — 0.9 % SODIUM CHLORIDE (POUR BTL) OPTIME
TOPICAL | Status: DC | PRN
  Administered 2023-04-30: 1000 mL

## 2023-04-30 MED ORDER — METHOCARBAMOL 1000 MG/10ML IJ SOLN
INTRAMUSCULAR | Status: AC
  Filled 2023-04-30: qty 10

## 2023-04-30 MED ORDER — LIDOCAINE HCL (CARDIAC) PF 100 MG/5ML IV SOSY
PREFILLED_SYRINGE | INTRAVENOUS | Status: DC | PRN
  Administered 2023-04-30: 100 mg via INTRAVENOUS

## 2023-04-30 MED ORDER — KETAMINE HCL 50 MG/5ML IJ SOSY
PREFILLED_SYRINGE | INTRAMUSCULAR | Status: DC | PRN
  Administered 2023-04-30 (×2): 10 mg via INTRAVENOUS
  Administered 2023-04-30: 30 mg via INTRAVENOUS

## 2023-04-30 MED ORDER — DEXAMETHASONE SODIUM PHOSPHATE 10 MG/ML IJ SOLN
INTRAMUSCULAR | Status: AC
  Filled 2023-04-30: qty 1

## 2023-04-30 MED ORDER — LIDOCAINE HCL (PF) 2 % IJ SOLN
INTRAMUSCULAR | Status: AC
  Filled 2023-04-30: qty 5

## 2023-04-30 MED ORDER — ALFUZOSIN HCL ER 10 MG PO TB24
10.0000 mg | ORAL_TABLET | Freq: Every day | ORAL | Status: DC
  Administered 2023-04-30 – 2023-05-03 (×4): 10 mg via ORAL
  Filled 2023-04-30 (×4): qty 1

## 2023-04-30 MED ORDER — DEXMEDETOMIDINE HCL IN NACL 80 MCG/20ML IV SOLN
INTRAVENOUS | Status: DC | PRN
  Administered 2023-04-30 (×3): 8 ug via INTRAVENOUS

## 2023-04-30 MED ORDER — PHENYLEPHRINE HCL-NACL 20-0.9 MG/250ML-% IV SOLN
INTRAVENOUS | Status: DC | PRN
Start: 2023-04-30 — End: 2023-04-30
  Administered 2023-04-30: 80 ug/min via INTRAVENOUS

## 2023-04-30 MED ORDER — SODIUM CHLORIDE (PF) 0.9 % IJ SOLN
INTRAMUSCULAR | Status: AC
  Filled 2023-04-30: qty 20

## 2023-04-30 MED ORDER — DOCUSATE SODIUM 100 MG PO CAPS
100.0000 mg | ORAL_CAPSULE | Freq: Two times a day (BID) | ORAL | Status: DC
  Administered 2023-04-30 – 2023-05-03 (×6): 100 mg via ORAL
  Filled 2023-04-30 (×6): qty 1

## 2023-04-30 MED ORDER — METHOCARBAMOL 500 MG PO TABS
500.0000 mg | ORAL_TABLET | Freq: Four times a day (QID) | ORAL | Status: DC | PRN
  Administered 2023-05-03: 500 mg via ORAL
  Filled 2023-04-30: qty 1

## 2023-04-30 MED ORDER — LATANOPROST 0.005 % OP SOLN
1.0000 [drp] | Freq: Every day | OPHTHALMIC | Status: DC
Start: 2023-05-01 — End: 2023-05-03
  Administered 2023-04-30 – 2023-05-02 (×3): 1 [drp] via OPHTHALMIC
  Filled 2023-04-30: qty 2.5

## 2023-04-30 MED ORDER — MAGNESIUM CITRATE PO SOLN: 1.0000 | Freq: Once | ORAL | Status: DC | PRN

## 2023-04-30 MED ORDER — PHENYLEPHRINE HCL-NACL 20-0.9 MG/250ML-% IV SOLN
INTRAVENOUS | Status: AC
  Filled 2023-04-30: qty 250

## 2023-04-30 MED ORDER — OXYCODONE HCL 5 MG PO TABS
10.0000 mg | ORAL_TABLET | ORAL | Status: DC | PRN
  Administered 2023-04-30 – 2023-05-03 (×8): 10 mg via ORAL
  Filled 2023-04-30 (×9): qty 2

## 2023-04-30 MED ORDER — GABAPENTIN 300 MG PO CAPS
300.0000 mg | ORAL_CAPSULE | Freq: Three times a day (TID) | ORAL | Status: DC
  Administered 2023-04-30 – 2023-05-03 (×8): 300 mg via ORAL
  Filled 2023-04-30 (×10): qty 1

## 2023-04-30 MED ORDER — PROPOFOL 10 MG/ML IV BOLUS
INTRAVENOUS | Status: AC
  Filled 2023-04-30: qty 20

## 2023-04-30 MED ORDER — CEFAZOLIN SODIUM-DEXTROSE 2-4 GM/100ML-% IV SOLN
INTRAVENOUS | Status: AC
  Filled 2023-04-30: qty 100

## 2023-04-30 MED ORDER — SODIUM CHLORIDE (PF) 0.9 % IJ SOLN
INTRAMUSCULAR | Status: DC | PRN
  Administered 2023-04-30: 60 mL

## 2023-04-30 MED ORDER — ALUM & MAG HYDROXIDE-SIMETH 200-200-20 MG/5ML PO SUSP: 30.0000 mL | Freq: Four times a day (QID) | ORAL | Status: DC | PRN

## 2023-04-30 MED ORDER — REMIFENTANIL HCL 1 MG IV SOLR
INTRAVENOUS | Status: AC
  Filled 2023-04-30: qty 1000

## 2023-04-30 MED ORDER — MIDAZOLAM HCL 2 MG/2ML IJ SOLN
INTRAMUSCULAR | Status: AC
  Filled 2023-04-30: qty 2

## 2023-04-30 MED ORDER — ACETAMINOPHEN 650 MG RE SUPP: 650.0000 mg | RECTAL | Status: DC | PRN

## 2023-04-30 MED ORDER — FENTANYL CITRATE (PF) 100 MCG/2ML IJ SOLN
INTRAMUSCULAR | Status: AC
  Filled 2023-04-30: qty 2

## 2023-04-30 MED ORDER — LACTATED RINGERS IV SOLN: INTRAVENOUS | Status: DC | PRN

## 2023-04-30 MED ORDER — SODIUM CHLORIDE 0.9 % IV SOLN
250.0000 mL | INTRAVENOUS | Status: AC
  Administered 2023-04-30: 250 mL via INTRAVENOUS

## 2023-04-30 MED ORDER — ACETAMINOPHEN 10 MG/ML IV SOLN
INTRAVENOUS | Status: AC
  Filled 2023-04-30: qty 100

## 2023-04-30 MED ORDER — MENTHOL 3 MG MT LOZG: 1.0000 | LOZENGE | OROMUCOSAL | Status: DC | PRN

## 2023-04-30 MED ORDER — PROPOFOL 1000 MG/100ML IV EMUL
INTRAVENOUS | Status: AC
  Filled 2023-04-30: qty 100

## 2023-04-30 MED ORDER — KETAMINE HCL 50 MG/5ML IJ SOSY
PREFILLED_SYRINGE | INTRAMUSCULAR | Status: AC
  Filled 2023-04-30: qty 5

## 2023-04-30 MED ORDER — BISACODYL 5 MG PO TBEC: 10.0000 mg | DELAYED_RELEASE_TABLET | Freq: Every day | ORAL | Status: DC | PRN

## 2023-04-30 MED ORDER — GLYCOPYRROLATE 0.2 MG/ML IJ SOLN
INTRAMUSCULAR | Status: AC
Start: 2023-04-30 — End: ?
  Filled 2023-04-30: qty 1

## 2023-04-30 MED ORDER — PANTOPRAZOLE SODIUM 40 MG IV SOLR
40.0000 mg | Freq: Every day | INTRAVENOUS | Status: DC
  Administered 2023-04-30 – 2023-05-02 (×3): 40 mg via INTRAVENOUS
  Filled 2023-04-30 (×3): qty 10

## 2023-04-30 MED ORDER — METHOCARBAMOL 1000 MG/10ML IJ SOLN
500.0000 mg | Freq: Four times a day (QID) | INTRAMUSCULAR | Status: DC | PRN
  Administered 2023-04-30: 500 mg via INTRAVENOUS
  Filled 2023-04-30: qty 10

## 2023-04-30 MED ORDER — NITROFURANTOIN MONOHYD MACRO 100 MG PO CAPS
100.0000 mg | ORAL_CAPSULE | Freq: Every day | ORAL | Status: DC
  Administered 2023-04-30 – 2023-05-03 (×4): 100 mg via ORAL
  Filled 2023-04-30 (×4): qty 1

## 2023-04-30 MED ORDER — PHENOL 1.4 % MT LIQD: 1.0000 | OROMUCOSAL | Status: DC | PRN

## 2023-04-30 MED ORDER — SUCCINYLCHOLINE CHLORIDE 200 MG/10ML IV SOSY
PREFILLED_SYRINGE | INTRAVENOUS | Status: DC | PRN
  Administered 2023-04-30: 120 mg via INTRAVENOUS

## 2023-04-30 MED ORDER — OXYCODONE HCL 5 MG PO TABS
ORAL_TABLET | ORAL | Status: AC
  Filled 2023-04-30: qty 2

## 2023-04-30 MED ORDER — SODIUM CHLORIDE 0.9% FLUSH: 3.0000 mL | INTRAVENOUS | Status: DC | PRN

## 2023-04-30 MED ORDER — HYDROMORPHONE HCL 1 MG/ML IJ SOLN
0.5000 mg | INTRAMUSCULAR | Status: DC | PRN
  Administered 2023-05-01 – 2023-05-02 (×2): 0.5 mg via INTRAVENOUS
  Filled 2023-04-30 (×2): qty 0.5

## 2023-04-30 MED ORDER — ACETAMINOPHEN 500 MG PO TABS
1000.0000 mg | ORAL_TABLET | Freq: Four times a day (QID) | ORAL | Status: AC
  Administered 2023-04-30 – 2023-05-01 (×4): 1000 mg via ORAL
  Filled 2023-04-30 (×4): qty 2

## 2023-04-30 MED ORDER — GLYCOPYRROLATE 0.2 MG/ML IJ SOLN
INTRAMUSCULAR | Status: DC | PRN
  Administered 2023-04-30 (×2): .1 mg via INTRAVENOUS

## 2023-04-30 MED ORDER — ACETAMINOPHEN 325 MG PO TABS
650.0000 mg | ORAL_TABLET | ORAL | Status: DC | PRN
  Administered 2023-05-02 – 2023-05-03 (×4): 650 mg via ORAL
  Filled 2023-04-30 (×4): qty 2

## 2023-04-30 MED ORDER — ALBUMIN HUMAN 5 % IV SOLN
INTRAVENOUS | Status: AC
  Filled 2023-04-30: qty 250

## 2023-04-30 MED ORDER — ONDANSETRON HCL 4 MG PO TABS: 4.0000 mg | ORAL_TABLET | Freq: Four times a day (QID) | ORAL | Status: DC | PRN

## 2023-04-30 MED ORDER — ONDANSETRON HCL 4 MG/2ML IJ SOLN
INTRAMUSCULAR | Status: AC
  Filled 2023-04-30: qty 2

## 2023-04-30 MED ORDER — IRRISEPT - 450ML BOTTLE WITH 0.05% CHG IN STERILE WATER, USP 99.95% OPTIME
TOPICAL | Status: DC | PRN
Start: 2023-04-30 — End: 2023-04-30
  Administered 2023-04-30: 450 mL

## 2023-04-30 MED ORDER — CEFAZOLIN SODIUM 1 G IJ SOLR
INTRAMUSCULAR | Status: AC
  Filled 2023-04-30: qty 20

## 2023-04-30 MED ORDER — ENOXAPARIN SODIUM 40 MG/0.4ML IJ SOSY
40.0000 mg | PREFILLED_SYRINGE | INTRAMUSCULAR | Status: DC
  Administered 2023-05-01 – 2023-05-03 (×3): 40 mg via SUBCUTANEOUS
  Filled 2023-04-30 (×3): qty 0.4

## 2023-04-30 MED ORDER — OXYCODONE HCL 5 MG PO TABS
5.0000 mg | ORAL_TABLET | ORAL | Status: DC | PRN
  Administered 2023-05-02 – 2023-05-03 (×5): 5 mg via ORAL
  Filled 2023-04-30 (×6): qty 1

## 2023-04-30 MED ORDER — BUPIVACAINE-EPINEPHRINE (PF) 0.5% -1:200000 IJ SOLN
INTRAMUSCULAR | Status: DC | PRN
  Administered 2023-04-30: 16 mL

## 2023-04-30 MED ORDER — CHLORHEXIDINE GLUCONATE 0.12 % MT SOLN
OROMUCOSAL | Status: AC
  Filled 2023-04-30: qty 15

## 2023-04-30 MED ORDER — SUCCINYLCHOLINE CHLORIDE 200 MG/10ML IV SOSY
PREFILLED_SYRINGE | INTRAVENOUS | Status: AC
  Filled 2023-04-30: qty 10

## 2023-04-30 MED ORDER — REMIFENTANIL HCL 1 MG IV SOLR
INTRAVENOUS | Status: DC | PRN
  Administered 2023-04-30: .05 ug/kg/min via INTRAVENOUS

## 2023-04-30 MED ORDER — BRIMONIDINE TARTRATE 0.2 % OP SOLN
1.0000 [drp] | Freq: Two times a day (BID) | OPHTHALMIC | Status: DC
Start: 2023-05-01 — End: 2023-05-03
  Administered 2023-04-30 – 2023-05-03 (×6): 1 [drp] via OPHTHALMIC
  Filled 2023-04-30: qty 5

## 2023-04-30 MED ORDER — SENNA 8.6 MG PO TABS
1.0000 | ORAL_TABLET | Freq: Two times a day (BID) | ORAL | Status: DC
  Administered 2023-04-30 – 2023-05-03 (×6): 8.6 mg via ORAL
  Filled 2023-04-30 (×6): qty 1

## 2023-04-30 MED ORDER — PROPOFOL 10 MG/ML IV BOLUS
INTRAVENOUS | Status: DC | PRN
  Administered 2023-04-30: 140 ug/kg/min via INTRAVENOUS

## 2023-04-30 MED ORDER — BUPIVACAINE HCL (PF) 0.5 % IJ SOLN
INTRAMUSCULAR | Status: AC
  Filled 2023-04-30: qty 30

## 2023-04-30 MED ORDER — PHENYLEPHRINE 80 MCG/ML (10ML) SYRINGE FOR IV PUSH (FOR BLOOD PRESSURE SUPPORT)
PREFILLED_SYRINGE | INTRAVENOUS | Status: AC
  Filled 2023-04-30: qty 10

## 2023-04-30 MED ORDER — MIDAZOLAM HCL 2 MG/2ML IJ SOLN
INTRAMUSCULAR | Status: DC | PRN
  Administered 2023-04-30: 2 mg via INTRAVENOUS

## 2023-04-30 MED ORDER — BACITRACIN ZINC 500 UNIT/GM EX OINT
TOPICAL_OINTMENT | CUTANEOUS | Status: DC | PRN
  Administered 2023-04-30: 1 via TOPICAL

## 2023-04-30 MED ORDER — BACITRACIN ZINC 500 UNIT/GM EX OINT
TOPICAL_OINTMENT | CUTANEOUS | Status: AC
  Filled 2023-04-30: qty 28.35

## 2023-04-30 MED ORDER — DEXAMETHASONE SODIUM PHOSPHATE 10 MG/ML IJ SOLN
INTRAMUSCULAR | Status: DC | PRN
  Administered 2023-04-30: 10 mg via INTRAVENOUS

## 2023-04-30 MED ORDER — ONDANSETRON HCL 4 MG/2ML IJ SOLN
4.0000 mg | Freq: Four times a day (QID) | INTRAMUSCULAR | Status: DC | PRN
  Administered 2023-05-01: 4 mg via INTRAVENOUS
  Filled 2023-04-30: qty 2

## 2023-04-30 MED ORDER — BUPIVACAINE-EPINEPHRINE (PF) 0.5% -1:200000 IJ SOLN
INTRAMUSCULAR | Status: AC
  Filled 2023-04-30: qty 20

## 2023-04-30 MED ORDER — FENTANYL CITRATE (PF) 100 MCG/2ML IJ SOLN
INTRAMUSCULAR | Status: DC | PRN
  Administered 2023-04-30 (×2): 50 ug via INTRAVENOUS

## 2023-04-30 MED ORDER — VANCOMYCIN HCL 1000 MG IV SOLR
INTRAVENOUS | Status: AC
  Filled 2023-04-30: qty 20

## 2023-04-30 MED ORDER — TADALAFIL 5 MG PO TABS
2.5000 mg | ORAL_TABLET | Freq: Every day | ORAL | Status: DC
  Administered 2023-05-01 – 2023-05-02 (×2): 2.5 mg via ORAL
  Filled 2023-04-30 (×4): qty 1

## 2023-04-30 MED ORDER — SODIUM CHLORIDE 0.9% FLUSH
3.0000 mL | Freq: Two times a day (BID) | INTRAVENOUS | Status: DC
  Administered 2023-04-30 – 2023-05-03 (×6): 3 mL via INTRAVENOUS

## 2023-04-30 MED ORDER — PHENYLEPHRINE 80 MCG/ML (10ML) SYRINGE FOR IV PUSH (FOR BLOOD PRESSURE SUPPORT)
PREFILLED_SYRINGE | INTRAVENOUS | Status: DC | PRN
  Administered 2023-04-30 (×3): 80 ug via INTRAVENOUS
  Administered 2023-04-30: 160 ug via INTRAVENOUS
  Administered 2023-04-30 (×2): 80 ug via INTRAVENOUS
  Administered 2023-04-30: 160 ug via INTRAVENOUS
  Administered 2023-04-30: 80 ug via INTRAVENOUS
  Administered 2023-04-30 (×2): 160 ug via INTRAVENOUS
  Administered 2023-04-30: 80 ug via INTRAVENOUS

## 2023-04-30 MED ORDER — ALBUMIN HUMAN 5 % IV SOLN: INTRAVENOUS | Status: DC | PRN

## 2023-04-30 MED ORDER — FENTANYL CITRATE (PF) 100 MCG/2ML IJ SOLN
25.0000 ug | INTRAMUSCULAR | Status: DC | PRN
  Administered 2023-04-30 (×2): 50 ug via INTRAVENOUS

## 2023-04-30 MED ORDER — CEFAZOLIN SODIUM-DEXTROSE 2-4 GM/100ML-% IV SOLN
2.0000 g | Freq: Four times a day (QID) | INTRAVENOUS | Status: AC
  Administered 2023-04-30 – 2023-05-01 (×2): 2 g via INTRAVENOUS
  Filled 2023-04-30 (×2): qty 100

## 2023-04-30 MED ORDER — BUPIVACAINE LIPOSOME 1.3 % IJ SUSP
INTRAMUSCULAR | Status: AC
Start: 2023-04-30 — End: ?
  Filled 2023-04-30: qty 20

## 2023-04-30 SURGICAL SUPPLY — 76 items
ALLOGRAFT BONE FIBER KORE 1CC (Bone Implant) IMPLANT
ALLOGRAFT BONESTRIP KORE 2.5X5 (Bone Implant) IMPLANT
ANCHOR GUIDE 12 COALITION (Anchor) IMPLANT
BASIN KIT SINGLE STR (MISCELLANEOUS) ×2 IMPLANT
BIT DRILL NS LF DISP RELINE C (BIT) IMPLANT
BIT DRL NS LF DISP RELINE C (BIT) ×1 IMPLANT
BRUSH SCRUB EZ 4% CHG (MISCELLANEOUS) ×2 IMPLANT
BUR NEURO DRILL SOFT 3.0X3.8M (BURR) ×2 IMPLANT
CONN RELINE C OPEN OFFSET 11 (Connector) IMPLANT
DERMABOND ADVANCED .7 DNX12 (GAUZE/BANDAGES/DRESSINGS) ×2 IMPLANT
DRAIN CHANNEL JP 10F RND 20C F (MISCELLANEOUS) IMPLANT
DRAPE C-ARM XRAY 36X54 (DRAPES) ×4 IMPLANT
DRAPE LAPAROTOMY 77X122 PED (DRAPES) ×2 IMPLANT
DRAPE MICROSCOPE SPINE 48X150 (DRAPES) ×1 IMPLANT
DRSG OPSITE POSTOP 3X4 (GAUZE/BANDAGES/DRESSINGS) ×2 IMPLANT
DRSG TEGADERM 2-3/8X2-3/4 SM (GAUZE/BANDAGES/DRESSINGS) ×2 IMPLANT
ELECT EZSTD 165MM 6.5IN (MISCELLANEOUS) ×1 IMPLANT
ELECT REM PT RETURN 9FT ADLT (ELECTROSURGICAL) ×1 IMPLANT
ELECTRODE EZSTD 165MM 6.5IN (MISCELLANEOUS) ×2 IMPLANT
ELECTRODE REM PT RTRN 9FT ADLT (ELECTROSURGICAL) ×2 IMPLANT
EVACUATOR 1/8 PVC DRAIN (DRAIN) IMPLANT
EVACUATOR SILICONE 100CC (DRAIN) IMPLANT
FEE INTRAOP CADWELL SUPPLY NCS (MISCELLANEOUS) IMPLANT
FEE INTRAOP MONITOR IMPULS NCS (MISCELLANEOUS) IMPLANT
GAUZE 4X4 16PLY ~~LOC~~+RFID DBL (SPONGE) IMPLANT
GLOVE BIOGEL PI IND STRL 7.0 (GLOVE) ×2 IMPLANT
GLOVE BIOGEL PI IND STRL 8 (GLOVE) ×4 IMPLANT
GLOVE SURG SYN 7.0 (GLOVE) ×1 IMPLANT
GLOVE SURG SYN 7.0 PF PI (GLOVE) ×2 IMPLANT
GLOVE SURG SYN 7.5 E (GLOVE) ×2 IMPLANT
GLOVE SURG SYN 7.5 PF PI (GLOVE) ×4 IMPLANT
GOWN SRG LRG LVL 4 IMPRV REINF (GOWNS) ×2 IMPLANT
GOWN SRG XL LVL 3 NONREINFORCE (GOWNS) ×2 IMPLANT
GOWN STRL REUS W/ TWL XL LVL3 (GOWN DISPOSABLE) ×1 IMPLANT
HOLDER FOLEY CATH W/STRAP (MISCELLANEOUS) IMPLANT
INTRAOP CADWELL SUPPLY FEE NCS (MISCELLANEOUS) ×1 IMPLANT
INTRAOP MONITOR FEE IMPULS NCS (MISCELLANEOUS) ×1 IMPLANT
JET LAVAGE IRRISEPT WOUND (IRRIGATION / IRRIGATOR) ×1 IMPLANT
KIT INFUSE MEDIUM (Orthopedic Implant) IMPLANT
KIT TURNOVER KIT A (KITS) ×1 IMPLANT
LAVAGE JET IRRISEPT WOUND (IRRIGATION / IRRIGATOR) IMPLANT
MANIFOLD NEPTUNE II (INSTRUMENTS) ×1 IMPLANT
MARKER SKIN DUAL TIP RULER LAB (MISCELLANEOUS) ×2 IMPLANT
NS IRRIG 1000ML POUR BTL (IV SOLUTION) ×2 IMPLANT
NS IRRIG 500ML POUR BTL (IV SOLUTION) ×2 IMPLANT
PACK LAMINECTOMY ARMC (PACKS) ×2 IMPLANT
PAD ARMBOARD 7.5X6 YLW CONV (MISCELLANEOUS) ×4 IMPLANT
PIN CASPAR 14 (PIN) ×2 IMPLANT
PIN CASPAR 14MM (PIN) ×1 IMPLANT
PIN MAYFIELD SKULL DISP (PIN) ×2 IMPLANT
ROD PB RELINE C 3.5X100 (Rod) IMPLANT
SCREW FA RELINE C 4X16 (Screw) ×2 IMPLANT
SCREW LOCK RELINE C OPEN (Screw) IMPLANT
SCREW MA RELINE C 3.5X14 (Screw) ×8 IMPLANT
SCREW MA RELINE C 4.5X26 (Screw) ×2 IMPLANT
SCREW RELINE C MA 5.5X25 (Screw) ×2 IMPLANT
SCREW SP FA RELINE-C 4X16 (Screw) IMPLANT
SCREW SP MA RELINE-C 3.5X14 (Screw) IMPLANT
SCREW SP MA RELINE-C 4.5X26 (Screw) IMPLANT
SCREW SP MA TH RELINE-C 5.5X25 (Screw) IMPLANT
SPACER LORD COAL 14X7X12 7D (Spacer) IMPLANT
SPACER LORD MIS 14X6X12 7D (Spacer) IMPLANT
SPONGE KITTNER 5P (MISCELLANEOUS) ×1 IMPLANT
STAPLER SKIN PROX 35W (STAPLE) ×2 IMPLANT
SURGIFLO W/THROMBIN 8M KIT (HEMOSTASIS) ×2 IMPLANT
SUT ETHILON 3-0 (SUTURE) IMPLANT
SUT STRATA 3-0 30 PS-1 (SUTURE) IMPLANT
SUT VIC AB 0 CT1 27XCR 8 STRN (SUTURE) ×6 IMPLANT
SUT VIC AB 2-0 CT1 18 (SUTURE) ×4 IMPLANT
SUT VIC AB 3-0 SH 8-18 (SUTURE) ×2 IMPLANT
SYR 20ML LL LF (SYRINGE) ×2 IMPLANT
SYR 30ML LL (SYRINGE) ×4 IMPLANT
SYR 3ML LL SCALE MARK (SYRINGE) ×2 IMPLANT
TAPE CLOTH 3X10 WHT NS LF (GAUZE/BANDAGES/DRESSINGS) ×6 IMPLANT
TRAP FLUID SMOKE EVACUATOR (MISCELLANEOUS) ×2 IMPLANT
TRAY FOLEY SLVR 16FR LF STAT (SET/KITS/TRAYS/PACK) IMPLANT

## 2023-04-30 NOTE — Anesthesia Procedure Notes (Signed)
 Procedure Name: Intubation Date/Time: 04/30/2023 10:06 AM  Performed by: Morene Crocker, CRNAPre-anesthesia Checklist: Patient identified, Patient being monitored, Timeout performed, Emergency Drugs available and Suction available Patient Re-evaluated:Patient Re-evaluated prior to induction Oxygen Delivery Method: Circle system utilized Preoxygenation: Pre-oxygenation with 100% oxygen Induction Type: IV induction Ventilation: Mask ventilation without difficulty Laryngoscope Size: 3 and McGrath Grade View: Grade I Tube type: Oral Tube size: 7.0 mm Number of attempts: 1 Airway Equipment and Method: Stylet Placement Confirmation: ETT inserted through vocal cords under direct vision, positive ETCO2 and breath sounds checked- equal and bilateral Secured at: 22 cm Tube secured with: Tape Dental Injury: Teeth and Oropharynx as per pre-operative assessment  Comments: Smooth atraumatic intubation, no complications noted.

## 2023-04-30 NOTE — Progress Notes (Signed)
 Patient awake/alert x4. Dr. Katrinka Blazing examined patient post procedure at bedside in pacu. Patient able to move bilateral lower ext, able to slightly bend bil knee's, push bilateral, right weaker then left. Upper ext right weaker then left, left hand thumb with weakness, same as pre-op.  Anterior neck with dermabond, 2x2 dressing with JP drain. Posterior with dermabond/honeycomb and hemovac. Medicated for pain as ordered. Family updated

## 2023-04-30 NOTE — Anesthesia Preprocedure Evaluation (Signed)
 Anesthesia Evaluation  Patient identified by MRN, date of birth, ID band Patient awake    Reviewed: Allergy & Precautions, NPO status , Patient's Chart, lab work & pertinent test results  History of Anesthesia Complications Negative for: history of anesthetic complications  Airway Mallampati: III  TM Distance: >3 FB Neck ROM: full    Dental  (+) Dental Advidsory Given, Teeth Intact   Pulmonary neg pulmonary ROS, former smoker   Pulmonary exam normal        Cardiovascular hypertension, (-) angina (-) Past MI, (-) Cardiac Stents and (-) CABG negative cardio ROS Normal cardiovascular exam(-) dysrhythmias      Neuro/Psych negative neurological ROS  negative psych ROS   GI/Hepatic negative GI ROS,,,(+) Hepatitis -, C  Endo/Other  diabetes    Renal/GU      Musculoskeletal   Abdominal   Peds  Hematology negative hematology ROS (+)   Anesthesia Other Findings Past Medical History: No date: Allergy     Comment:  Seasonal No date: BPH (benign prostatic hyperplasia) No date: Cervical myelopathy (HCC) 09/2022: Chest wall mass No date: Diabetes mellitus without complication (HCC) No date: Hepatitis C No date: History of kidney stones No date: Hyperlipidemia No date: Hypertension No date: Prediabetes No date: Primary open angle glaucoma (POAG) of left eye No date: Sepsis secondary to UTI [facility])  Past Surgical History: 12/26/2020: ANTERIOR CERVICAL DECOMPRESSION/DISCECTOMY FUSION 4  LEVELS; N/A     Comment:  Procedure: C3-6 ANTERIOR CERVICAL               DECOMPRESSION/DISCECTOMY FUSION 3 LEVELS;  Surgeon: Lucy Chris, MD;  Location: [facility] ORS;  Service: Neurosurgery;               Laterality: N/A; 12/21/2019: BROW LIFT AND BLEPHAROPLASTY; Bilateral 02/05/2019: CATARACT EXTRACTION W/ INTRAOCULAR LENS IMPLANT; Left 09/29/2021: COLONOSCOPY WITH PROPOFOL; N/A     Comment:  Procedure: COLONOSCOPY WITH  PROPOFOL;  Surgeon:               Regis Bill, MD;  Location: [facility] ENDOSCOPY;                Service: Endoscopy;  Laterality: N/A; 2002: KNEE SURGERY; Left     Comment:  Meniscus tear 12/13/2020: LITHOTRIPSY 03/06/2020: ORIF FEMORAL NECK FRACTURE W/ DHS; Left 07/25/2021: SHOULDER ARTHROSCOPY WITH OPEN ROTATOR CUFF REPAIR AND  DISTAL CLAVICLE ACROMINECTOMY; Left     Comment:  Procedure: SHOULDER ARTHROSCOPY WITH OPEN ROTATOR CUFF               REPAIR AND DISTAL CLAVICLE ACROMINECTOMY;  Surgeon:               Juanell Fairly, MD;  Location: [facility] ORS;  Service:               Orthopedics;  Laterality: Left; 03/06/2020: TOTAL HIP ARTHROPLASTY; Left  BMI    Body Mass Index: 35.87 kg/m      Reproductive/Obstetrics negative OB ROS                             Anesthesia Physical Anesthesia Plan  ASA: 3  Anesthesia Plan: General   Post-op Pain Management:    Induction: Intravenous  PONV Risk Score and Plan: 2 and Midazolam, Dexamethasone, Ondansetron and Treatment may vary due to age or medical condition  Airway Management Planned: Oral ETT  Additional Equipment:  Intra-op Plan:   Post-operative Plan: Extubation in OR  Informed Consent: I have reviewed the patients History and Physical, chart, labs and discussed the procedure including the risks, benefits and alternatives for the proposed anesthesia with the patient or authorized representative who has indicated his/her understanding and acceptance.     Dental Advisory Given  Plan Discussed with: Anesthesiologist, CRNA and Surgeon  Anesthesia Plan Comments: (Patient consented for risks of anesthesia including but not limited to:  - adverse reactions to medications - damage to eyes, teeth, lips or other oral mucosa - nerve damage due to positioning  - sore throat or hoarseness - Damage to heart, brain, nerves, lungs, other parts of body or loss of life  Patient voiced understanding.)        Anesthesia Quick Evaluation

## 2023-04-30 NOTE — Interval H&P Note (Signed)
 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  The various methods of treatment have been discussed with the patient and family. After consideration of risks, benefits and other options for treatment, the patient has consented to  Procedure(s): C6-7 ANTERIOR CERVICAL DISCECTOMY AND FUSION (N/A) C2-T2 POSTERIOR SPINAL FUSION (N/A) C2-C4 LAMINECTOMY (N/A) as a surgical intervention.  The patient's history has been reviewed, patient examined, no change in status, stable for surgery.  I have reviewed the patient's chart and labs.  Questions were answered to the patient's satisfaction.    Heart and lungs clear   Lovenia Kim

## 2023-04-30 NOTE — Anesthesia Postprocedure Evaluation (Signed)
 Anesthesia Post Note  Patient: Caleb Wall  Procedure(s) Performed: C6-7 ANTERIOR CERVICAL DISCECTOMY AND FUSION C2-T2 POSTERIOR SPINAL FUSION C2-C4 LAMINECTOMY  Patient location during evaluation: PACU Anesthesia Type: General Level of consciousness: awake and alert Pain management: pain level controlled Vital Signs Assessment: post-procedure vital signs reviewed and stable Respiratory status: spontaneous breathing, nonlabored ventilation, respiratory function stable and patient connected to nasal cannula oxygen Cardiovascular status: blood pressure returned to baseline and stable Postop Assessment: no apparent nausea or vomiting Anesthetic complications: no   No notable events documented.   Last Vitals:  Vitals:   04/30/23 1900 04/30/23 1907  BP: 116/80   Pulse: 86 86  Resp: 20 19  Temp:  36.6 C  SpO2: 100% 100%    Last Pain:  Vitals:   04/30/23 0922  TempSrc: Temporal  PainSc: 0-No pain                 Louie Boston

## 2023-04-30 NOTE — Transfer of Care (Signed)
 Immediate Anesthesia Transfer of Care Note  Patient: Caleb Wall  Procedure(s) Performed: C6-7 ANTERIOR CERVICAL DISCECTOMY AND FUSION C2-T2 POSTERIOR SPINAL FUSION C2-C4 LAMINECTOMY  Patient Location: PACU  Anesthesia Type:General  Level of Consciousness: drowsy and patient cooperative  Airway & Oxygen Therapy: Patient Spontanous Breathing and Patient connected to face mask oxygen  Post-op Assessment: Report given to RN and Post -op Vital signs reviewed and stable  Post vital signs: Reviewed and stable  Last Vitals:  Vitals Value Taken Time  BP 110/82 04/30/23 1820  Temp    Pulse 86 04/30/23 1821  Resp 17 04/30/23 1821  SpO2 100 % 04/30/23 1821  Vitals shown include unfiled device data.  Last Pain:  Vitals:   04/30/23 0922  TempSrc: Temporal  PainSc: 0-No pain         Complications: No notable events documented.

## 2023-05-01 DIAGNOSIS — Z981 Arthrodesis status: Secondary | ICD-10-CM

## 2023-05-01 DIAGNOSIS — G959 Disease of spinal cord, unspecified: Secondary | ICD-10-CM

## 2023-05-01 DIAGNOSIS — M4802 Spinal stenosis, cervical region: Secondary | ICD-10-CM | POA: Diagnosis not present

## 2023-05-01 DIAGNOSIS — E119 Type 2 diabetes mellitus without complications: Secondary | ICD-10-CM

## 2023-05-01 DIAGNOSIS — Z794 Long term (current) use of insulin: Secondary | ICD-10-CM

## 2023-05-01 LAB — BASIC METABOLIC PANEL
Anion gap: 9 (ref 5–15)
BUN: 17 mg/dL (ref 8–23)
CO2: 19 mmol/L — ABNORMAL LOW (ref 22–32)
Calcium: 8.2 mg/dL — ABNORMAL LOW (ref 8.9–10.3)
Chloride: 107 mmol/L (ref 98–111)
Creatinine, Ser: 0.96 mg/dL (ref 0.61–1.24)
GFR, Estimated: >60 mL/min (ref >60)
Glucose, Bld: 120 mg/dL — ABNORMAL HIGH (ref 70–99)
Potassium: 3.6 mmol/L (ref 3.5–5.1)
Sodium: 135 mmol/L (ref 135–145)

## 2023-05-01 MED ORDER — ORAL CARE MOUTH RINSE: 15.0000 mL | OROMUCOSAL | Status: DC | PRN

## 2023-05-01 NOTE — Progress Notes (Signed)

## 2023-05-01 NOTE — Evaluation (Signed)
 Physical Therapy Evaluation Patient Details Name: Caleb Wall MRN: [id number] DOB: 19-Feb-1956 Today's Date: 05/01/2023  History of Present Illness  68 y/o male s/p C6-7 ACDF, C2-T2 posterior fusio, C2-C4 laminectomy 3/11.  H/o C3-6 ACDF 12/2020.  Clinical Impression  Pt sleeping on arrival, somewhat difficult to arouse, once awake was eager to work with PT but struggled to consistently stay awake and on task (reports that this can happen to him with oxy, nursing aware).  Pt needed significant assist with transitions to/from supine/sitting but did show good effort, struggled to rise from standard height bed but with +2" and assist he did attain standing and managed to stay on his feet for ~5 minutes with static standing, weight shifts, a few small side steps and then 2X b/l marching in place with eventual buckling in R knee, due to weakness and lethargy ambulation/etc away from the bed was deferred.  Pt motivated but limited.  He will benefit from continued PT to address functional limitations.        If plan is discharge home, recommend the following: Two people to help with walking and/or transfers;A lot of help with bathing/dressing/bathroom;Assistance with cooking/housework;Assist for transportation;Help with stairs or ramp for entrance   Can travel by private vehicle        Equipment Recommendations  (TBD at next venue of care)  Recommendations for Other Services       Functional Status Assessment Patient has had a recent decline in their functional status and demonstrates the ability to make significant improvements in function in a reasonable and predictable amount of time.     Precautions / Restrictions Precautions Precautions: Cervical Restrictions Weight Bearing Restrictions Per Provider Order: No      Mobility  Bed Mobility Overal bed mobility: Needs Assistance Bed Mobility: Supine to Sit, Sit to Supine     Supine to sit: Mod assist Sit to supine: Max assist    General bed mobility comments: Pt showed good effort but was ultimately very limited, needed heavy assist with all aspects of trying to get to/from supine    Transfers Overall transfer level: Needs assistance Equipment used: Rolling walker (2 wheels) Transfers: Sit to/from Stand Sit to Stand: Mod assist, From elevated surface           General transfer comment: unable to rise to standing from standard height bed and modA, further cuing for set up and sequencing and ~2" elevation with modA to rise    Ambulation/Gait               General Gait Details: pt's mentation, pain and weakness all preculded ambulation away from the bed today but he did manage to take a few side steps along EOB with heavy cuing and plenty of UE use on walker.  Attempted marching in place, manage X 2 b/l (knee to RW cross bar) but had buckling and further attempts deferred for safety and pain considerations  Stairs            Wheelchair Mobility     Tilt Bed    Modified Rankin (Stroke Patients Only)       Balance Overall balance assessment: History of Falls, Needs assistance Sitting-balance support: Bilateral upper extremity supported, Feet supported Sitting balance-Leahy Scale: Fair   Postural control: Right lateral lean Standing balance support: Bilateral upper extremity supported, During functional activity, Reliant on assistive device for balance Standing balance-Leahy Scale: Poor  Pertinent Vitals/Pain Pain Assessment Pain Score: 6  (reports nearly 10/10 with any movement) Pain Location: Neck Pain Descriptors / Indicators: Sharp, Operative site guarding    Home Living Family/patient expects to be discharged to:: Unsure Living Arrangements: Spouse/significant other Available Help at Discharge: Family Type of Home: House Home Access: Stairs to enter Entrance Stairs-Rails: Can reach both Entrance Stairs-Number of Steps: 4   Home  Layout: One level Home Equipment: Grab bars - toilet;Grab bars - tub/shower;Hand held shower head;Shower seat;Cane - single point      Prior Function Prior Level of Function : Independent/Modified Independent;History of Falls (last six months);Driving             Mobility Comments: Cane PRN when legs feel weak ADLs Comments: Indep     Extremity/Trunk Assessment   Upper Extremity Assessment Upper Extremity Assessment: Generalized weakness (expected post surgical hesitancy, able to functionally use for mobility, mentation limited formal testing)    Lower Extremity Assessment Lower Extremity Assessment: Generalized weakness;Overall [facility] for tasks assessed (functional and seemingly near symmetrical LE strength)       Communication   Communication Communication: No apparent difficulties    Cognition Arousal: Suspect due to medications, Obtunded Behavior During Therapy:  (pt very sleepy from pain meds, eager to work with PT but difficulty staying on task)   PT - Cognitive impairments: Difficult to assess Difficult to assess due to: Level of arousal                       Following commands: Impaired Following commands impaired: Follows multi-step commands inconsistently     Cueing Cueing Techniques: Verbal cues, Gestural cues     General Comments      Exercises     Assessment/Plan    PT Assessment Patient needs continued PT services  PT Problem List         PT Treatment Interventions DME instruction;Gait training;Stair training;Functional mobility training;Therapeutic activities;Therapeutic exercise;Balance training;Patient/family education;Neuromuscular re-education    PT Goals (Current goals can be found in the Care Plan section)  Acute Rehab PT Goals Patient Stated Goal: Control pain PT Goal Formulation: With patient Time For Goal Achievement: 05/14/23 Potential to Achieve Goals: Good    Frequency Min 3X/week     Co-evaluation                AM-PAC PT "6 Clicks" Mobility  Outcome Measure Help needed turning from your back to your side while in a flat bed without using bedrails?: A Lot Help needed moving from lying on your back to sitting on the side of a flat bed without using bedrails?: A Lot Help needed moving to and from a bed to a chair (including a wheelchair)?: A Lot Help needed standing up from a chair using your arms (e.g., wheelchair or bedside chair)?: A Lot Help needed to walk in hospital room?: Total Help needed climbing 3-5 steps with a railing? : Total 6 Click Score: 10    End of Session Equipment Utilized During Treatment: Gait belt Activity Tolerance: Patient tolerated treatment well;Patient limited by pain;Patient limited by lethargy Patient left: with bed alarm set;with call bell/phone within reach;with family/visitor present Nurse Communication: Mobility status PT Visit Diagnosis: Muscle weakness (generalized) (M62.81);Difficulty in walking, not elsewhere classified (R26.2);Unsteadiness on feet (R26.81);Pain;Other symptoms and signs involving the nervous system (R29.898) Pain - part of body:  (cervicalgia)    Time: 0102-7253 PT Time Calculation (min) (ACUTE ONLY): 28 min   Charges:   PT Evaluation $  PT Eval Low Complexity: 1 Low PT Treatments $Therapeutic Activity: 8-22 mins PT General Charges $$ ACUTE PT VISIT: 1 Visit         Malachi Pro, DPT 05/01/2023, 4:09 PM

## 2023-05-01 NOTE — Progress Notes (Signed)
   Neurosurgery Progress Note  History: Caleb Wall is s/p C6-7 ACDF and C2-4 laminectomies, C2-T2 PSF  POD1: NAEO. Pt reported expected pain in his neck  Physical Exam: Vitals:   04/30/23 2257 05/01/23 0434  BP: (!) 159/93 139/81  Pulse: 97 90  Resp: 19 17  Temp: 98.7 F (37.1 C) (!) 97.2 F (36.2 C)  SpO2: 99% 98%    AA Ox3 CNI Strength: Side Biceps Triceps Deltoid Interossei Grip Wrist Ext. Wrist Flex.  R 4 5 4 4 5 5 5   L 5 5 5 4 5 5 5     Side Iliopsoas Quads Hamstring PF DF EHL  R 4 5 5 4 4 5   L 5 5 5 4 4 5     Anterior drain 41 since surgery Posterior drain 100 since surgery Data:  Other tests/results: NA  Assessment/Plan:  Caleb Wall is a 68 y.o presenting with cervical stenosis and myelopathy status post C6-7 ACDF and C2-4 laminectomies, C2-T2 PSF  - mobilize - pain control - DVT prophylaxis - continue drains for now - PTOT  Manning Charity PA-C Department of Neurosurgery

## 2023-05-01 NOTE — Plan of Care (Signed)
  Problem: Education: Goal: Knowledge of General Education information will improve Description: Including pain rating scale, medication(s)/side effects and non-pharmacologic comfort measures Outcome: Progressing   Problem: Health Behavior/Discharge Planning: Goal: Ability to manage health-related needs will improve Outcome: Progressing   Problem: Clinical Measurements: Goal: Ability to maintain clinical measurements within normal limits will improve Outcome: Progressing Goal: Will remain free from infection Outcome: Progressing Goal: Diagnostic test results will improve Outcome: Progressing Goal: Respiratory complications will improve Outcome: Progressing Goal: Cardiovascular complication will be avoided Outcome: Progressing   Problem: Activity: Goal: Risk for activity intolerance will decrease Outcome: Progressing   Problem: Nutrition: Goal: Adequate nutrition will be maintained Outcome: Progressing   Problem: Coping: Goal: Level of anxiety will decrease Outcome: Progressing   Problem: Elimination: Goal: Will not experience complications related to bowel motility Outcome: Progressing Goal: Will not experience complications related to urinary retention Outcome: Progressing   Problem: Pain Managment: Goal: General experience of comfort will improve and/or be controlled Outcome: Progressing   Problem: Skin Integrity: Goal: Risk for impaired skin integrity will decrease Outcome: Progressing   Problem: Education: Goal: Ability to verbalize activity precautions or restrictions will improve Outcome: Progressing Goal: Knowledge of the prescribed therapeutic regimen will improve Outcome: Progressing Goal: Understanding of discharge needs will improve Outcome: Progressing   Problem: Activity: Goal: Ability to avoid complications of mobility impairment will improve Outcome: Progressing Goal: Ability to tolerate increased activity will improve Outcome:  Progressing Goal: Will remain free from falls Outcome: Progressing   Problem: Bowel/Gastric: Goal: Gastrointestinal status for postoperative course will improve Outcome: Progressing   Problem: Clinical Measurements: Goal: Ability to maintain clinical measurements within normal limits will improve Outcome: Progressing Goal: Postoperative complications will be avoided or minimized Outcome: Progressing Goal: Diagnostic test results will improve Outcome: Progressing   Problem: Pain Management: Goal: Pain level will decrease Outcome: Progressing   Problem: Skin Integrity: Goal: Will show signs of wound healing Outcome: Progressing   Problem: Health Behavior/Discharge Planning: Goal: Identification of resources available to assist in meeting health care needs will improve Outcome: Progressing   Problem: Bladder/Genitourinary: Goal: Urinary functional status for postoperative course will improve Outcome: Progressing

## 2023-05-01 NOTE — Plan of Care (Signed)
  Problem: Clinical Measurements: Goal: Diagnostic test results will improve Outcome: Progressing   Problem: Activity: Goal: Risk for activity intolerance will decrease Outcome: Progressing   Problem: Nutrition: Goal: Adequate nutrition will be maintained Outcome: Progressing   Problem: Coping: Goal: Level of anxiety will decrease Outcome: Progressing   Problem: Elimination: Goal: Will not experience complications related to urinary retention Outcome: Progressing   Problem: Pain Managment: Goal: General experience of comfort will improve and/or be controlled Outcome: Progressing   Problem: Safety: Goal: Ability to remain free from injury will improve Outcome: Progressing   Problem: Education: Goal: Ability to verbalize activity precautions or restrictions will improve Outcome: Progressing   Problem: Activity: Goal: Ability to tolerate increased activity will improve Outcome: Progressing   Problem: Pain Management: Goal: Pain level will decrease Outcome: Progressing   Problem: Bladder/Genitourinary: Goal: Urinary functional status for postoperative course will improve Outcome: Progressing

## 2023-05-01 NOTE — Progress Notes (Signed)
 Inpatient Rehab Coordinator Note:  I spoke with pt's spouse, [name], over the phone to discuss CIR recommendations and goals/expectations of CIR stay.  We reviewed 3 hrs/day of therapy, physician follow up, and average length of stay 2 weeks (dependent upon progress) with goals of supervision to min assist.  On discussion it appears she is able to provide up to min assist for mobility and will be able to provide 24/7 care.  Pt's daughter is also able to assist PRN.  We reviewed need for insurance approval with [facility].  I will ask for PMR physician to assess and await PT evaluation.    Estill Dooms, PT, DPT Admissions Coordinator [phone] 05/01/23  1:34 PM

## 2023-05-01 NOTE — Progress Notes (Signed)
 Occupational Therapy Evaluation Patient Details Name: Caleb Wall MRN: [id number] DOB: 1955/11/23 Today's Date: 05/01/2023   History of Present Illness   [name] presents to acute care s/o post op day #1 C6-7 ANTERIOR CERVICAL DISCECTOMY AND FUSION  C2-T2 POSTERIOR SPINAL FUSION  C2-C4 LAMINECTOMY     Clinical Impressions Pt seen for OT evaluation this date, POD#1 from above spinal surgery. Prior to hospital admission, pt was MODI/independent with mobility, ADL, and IADL. Pt amb with SPC PRN depending on strength in LE that day. Wife reported hx of falls in the last 6 months. Pt lives with spouse in a single family home with 4 steps to enter. Pt spouse reports she is not able to provide 24/7 assist/support due to unable to assist in movement. Pt's daughter might be able to assist but not 100% on this date. Currently pt is MAXA with all aspects of self care tasks. Pt log rolling technique utilized during bed mobility. Pt required MAXA side lying <>sitting on EOB, and MAXA +2 for sitting to side lying. While sitting on EOB pt stated feeling nauseas, RN in room to assess. Pt STS from elevated bed height with MODA+2 with RW. Pt adamant on standing up, Pt stood for ~4mins before overcome with pain and closed eyes and began sitting back down. MAXA +2 to return pt in bed. Pt educated in cervical precautions, self care skills, AE, and home/routines modifications to maximize safety and functional independence while minimizing falls risk and maintaining precautions. Pt and wife verbalized understanding of all education/training provided. Handout provided to support recall and carry over of learned precautions/techniques for bed mobility, functional transfers, and self care skills. Pt would benefit from skilled OT services to address noted impairments and functional limitations (see below for any additional details) in order to maximize safety and independence while minimizing falls risk and caregiver  burden. OT will follow acutely.      If plan is discharge home, recommend the following:   A lot of help with walking and/or transfers;A lot of help with bathing/dressing/bathroom;Assistance with feeding;Assistance with cooking/housework;Direct supervision/assist for medications management;Assist for transportation     Functional Status Assessment   Patient has had a recent decline in their functional status and demonstrates the ability to make significant improvements in function in a reasonable and predictable amount of time.     Equipment Recommendations   Other (comment) (Rolling walker)     Recommendations for Other Services         Precautions/Restrictions   Precautions Precautions: Cervical     Mobility Bed Mobility Overal bed mobility: Needs Assistance Bed Mobility: Sidelying to Sit, Rolling, Sit to Sidelying Rolling: Max assist, +2 for physical assistance, Used rails Sidelying to sit: Used rails, Max assist     Sit to sidelying: Max assist, +2 for physical assistance General bed mobility comments: Log rolling technique utilized    Transfers Overall transfer level: Needs assistance Equipment used: Rolling walker (2 wheels) Transfers: Sit to/from Stand Sit to Stand: Mod assist, +2 physical assistance, From elevated surface           General transfer comment: Pt STS from elevated bed height with MODA+2 with RW. Pt stood for ~42mins before overcome with pain and closed eyes and began sitting back down. MAXA +2 to return pt in bed.      Balance Overall balance assessment: History of Falls, Needs assistance Sitting-balance support: Bilateral upper extremity supported, Feet supported Sitting balance-Leahy Scale: Fair   Postural control: Right  lateral lean Standing balance support: Bilateral upper extremity supported, During functional activity, Reliant on assistive device for balance Standing balance-Leahy Scale: Poor                              ADL either performed or assessed with clinical judgement   ADL Overall ADL's : Needs assistance/impaired Eating/Feeding: Minimal assistance;Sitting   Grooming: Wash/dry face;Maximal assistance (Wife completed while in bed)               Lower Body Dressing: Maximal assistance;Sit to/from stand               Functional mobility during ADLs: Rolling walker (2 wheels);+2 for physical assistance;Minimal assistance (RW; Min A for STS, MAXA to remain standing due to onset nausa, and increased level of pain when standing.) General ADL Comments: MAXA donning briefs in standing while pt holding onto walker, MODA x1 for standing stability.      Pertinent Vitals/Pain Pain Assessment Pain Assessment: 0-10 Pain Score: 10-Worst pain ever Pain Location: Neck Pain Descriptors / Indicators: Sharp, Operative site guarding Pain Intervention(s): Repositioned, Monitored during session, Limited activity within patient's tolerance, Premedicated before session     Extremity/Trunk Assessment Upper Extremity Assessment Upper Extremity Assessment: Generalized weakness   Lower Extremity Assessment Lower Extremity Assessment: Generalized weakness       Communication Communication Communication: No apparent difficulties   Cognition Arousal: Suspect due to medications Behavior During Therapy: WFL for tasks assessed/performed Cognition: No apparent impairments             OT - Cognition Comments: Pt A/Ox4, drowy during session from medications.                 Following commands: Impaired Following commands impaired: Follows multi-step commands inconsistently     Cueing  General Comments   Cueing Techniques: Verbal cues;Gestural cues  Pt dressing and drains d/c/i pre/post session   Exercises Exercises: Other exercises Other Exercises Other Exercises: Edu: role of OT, possible d/c options, log rolling technique, cervical precations   Shoulder Instructions       Home Living Family/patient expects to be discharged to:: Private residence Living Arrangements: Spouse/significant other Available Help at Discharge: Family (Daughter/wife) Type of Home: House Home Access: Stairs to enter Secretary/administrator of Steps: 4 Entrance Stairs-Rails: Can reach both (They are unstable) Home Layout: One level     Bathroom Shower/Tub: Tub/shower unit         Home Equipment: Grab bars - toilet;Grab bars - tub/shower;Hand held shower head;Shower seat;Cane - single point   Additional Comments: a/o x4      Prior Functioning/Environment Prior Level of Function : Independent/Modified Independent;History of Falls (last six months);Driving             Mobility Comments: Cane PRN when legs feel weak ADLs Comments: Indep    OT Problem List: Decreased strength;Decreased range of motion;Decreased activity tolerance;Impaired balance (sitting and/or standing);Decreased knowledge of use of DME or AE   OT Treatment/Interventions: DME and/or AE instruction;Energy conservation;Therapeutic activities;Patient/family education;Balance training;Self-care/ADL training;Therapeutic exercise      OT Goals(Current goals can be found in the care plan section)   Acute Rehab OT Goals Patient Stated Goal: to get better OT Goal Formulation: With patient Time For Goal Achievement: 05/15/23 Potential to Achieve Goals: Good   OT Frequency:  Min 2X/week    Co-evaluation              AM-PAC OT "  6 Clicks" Daily Activity     Outcome Measure Help from another person eating meals?: A Little Help from another person taking care of personal grooming?: A Lot Help from another person toileting, which includes using toliet, bedpan, or urinal?: A Lot Help from another person bathing (including washing, rinsing, drying)?: A Lot Help from another person to put on and taking off regular upper body clothing?: A Little Help from another person to put on and taking off  regular lower body clothing?: A Lot 6 Click Score: 14   End of Session Equipment Utilized During Treatment: Rolling walker (2 wheels);Gait belt Nurse Communication: Mobility status;Patient requests pain meds  Activity Tolerance: Patient limited by pain Patient left: in bed;with call bell/phone within reach;with bed alarm set;with nursing/sitter in room;with family/visitor present;with SCD's reapplied  OT Visit Diagnosis: Unsteadiness on feet (R26.81);Other abnormalities of gait and mobility (R26.89);Repeated falls (R29.6);Muscle weakness (generalized) (M62.81);History of falling (Z91.81);Pain Pain - part of body:  (Neck)                Time: 1610-9604 OT Time Calculation (min): 34 min Charges:  OT General Charges $OT Visit: 1 Visit OT Evaluation $OT Eval Low Complexity: 1 Low OT Treatments $Self Care/Home Management : 8-22 mins  Glenard Haring M.S. OTR/L  05/01/23, 10:40 AM

## 2023-05-01 NOTE — Consult Note (Signed)
 Physical Medicine and Rehabilitation Consult Reason for Consult: C6-C7 anterior cervical discectomy and fusion and C2-T2 posterior spinal fusion of C2-C4 Referring Physician: Ernestine Mcmurray, MD   HPI: Caleb Wall is a 68 y.o. male who is s/p C6-C7 ACDF and C2-C4 laminectomies and C2-T2 PSIF. Course has been complicated by postoperative pain but wife is concerned about oversedation from pain medication. Patient is currently able to stand for 2 min MaxA x2. Physical Medicine & Rehabilitation was consulted to assess candidacy for CIR.    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    Other secondary kyphosis, cervical region    Prediabetes    Primary open angle glaucoma (POAG) of left eye    Pseudoarthrosis of cervical spine (HCC)    Sepsis secondary to UTI (HCC)    Spondylosis of cervical spine    Past Surgical History:  Procedure Laterality Date   ANTERIOR CERVICAL DECOMPRESSION/DISCECTOMY FUSION 4 LEVELS N/A 12/26/2020   Procedure: C3-6 ANTERIOR CERVICAL DECOMPRESSION/DISCECTOMY FUSION 3 LEVELS;  Surgeon: Lucy Chris, MD;  Location: [facility] ORS;  Service: Neurosurgery;  Laterality: N/A;   BROW LIFT AND BLEPHAROPLASTY Bilateral 12/21/2019   CATARACT EXTRACTION W/ INTRAOCULAR LENS IMPLANT Left 02/05/2019   COLONOSCOPY WITH PROPOFOL N/A 09/29/2021   Procedure: COLONOSCOPY WITH PROPOFOL;  Surgeon: Regis Bill, MD;  Location: [facility] ENDOSCOPY;  Service: Endoscopy;  Laterality: N/A;   KNEE SURGERY Left 2002   Meniscus tear   LITHOTRIPSY  12/13/2020   MASS EXCISION N/A 10/11/2022   Procedure: EXCISION MASS CHEST WALL;  Surgeon: Sung Amabile, DO;  Location: [facility]  ORS;  Service: General;  Laterality: N/A;   ORIF FEMORAL NECK FRACTURE W/ DHS Left 03/06/2020   SHOULDER ARTHROSCOPY WITH OPEN ROTATOR CUFF REPAIR AND DISTAL CLAVICLE ACROMINECTOMY Left 07/25/2021   Procedure: SHOULDER ARTHROSCOPY WITH OPEN ROTATOR CUFF REPAIR AND DISTAL CLAVICLE ACROMINECTOMY;  Surgeon: Juanell Fairly, MD;  Location: [facility] ORS;  Service: Orthopedics;  Laterality: Left;   TOTAL HIP ARTHROPLASTY Left 03/06/2020   Family History  Problem Relation Age of Onset   Heart disease Mother    Hypertension Mother    Diabetes Maternal Aunt    Cancer Maternal Uncle        Prostate   Diabetes Paternal Aunt    Social History:  reports that he quit smoking about 3 years ago. His smoking use included cigarettes. He started smoking about 43 years ago. He has a 20 pack-year smoking history. He has never used smokeless tobacco. He reports that he does not currently use drugs after having used the following drugs: Marijuana. He reports that he does not drink alcohol. Allergies: No Known Allergies Medications Prior to Admission  Medication Sig Dispense Refill   alfuzosin (UROXATRAL) 10 MG 24 hr tablet Take 1 tablet (10 mg total) by mouth 3 (three) times a week. (Patient taking differently: Take 10 mg by mouth daily.) 13 tablet 11   Aspirin-Salicylamide-Caffeine (BC HEADACHE POWDER PO) Take 1 packet by mouth daily as needed (pain).     bisacodyl (DULCOLAX) 5 MG EC tablet Take 10 mg by mouth daily as  needed for moderate constipation.     brimonidine (ALPHAGAN) 0.2 % ophthalmic solution Place 1 drop into both eyes 2 (two) times daily.     dorzolamide-timolol (COSOPT) 22.3-6.8 MG/ML ophthalmic solution Place 1 drop into both eyes 2 (two) times daily.     latanoprost (XALATAN) 0.005 % ophthalmic solution Place 1 drop into both eyes at bedtime.     meloxicam (MOBIC) 15 MG tablet Take 15 mg by mouth daily.     MOUNJARO 10 MG/0.5ML Pen Inject 10 mg into the skin once a week.     nitrofurantoin,  macrocrystal-monohydrate, (MACROBID) 100 MG capsule Take 100 mg by mouth daily.     tadalafil (CIALIS) 5 MG tablet Take 0.5 tablets (2.5 mg total) by mouth 3 (three) times a week. (Patient taking differently: Take 5 mg by mouth as directed. Patient takes at bedtime Sunday, Wednesday, Friday) 7 tablet 11   traMADol (ULTRAM) 50 MG tablet Take 1 tablet (50 mg total) by mouth every 6 (six) hours as needed. 6 tablet 0   acetaminophen (TYLENOL) 500 MG tablet Take 1,000 mg by mouth every 6 (six) hours as needed.      Home: Home Living Family/patient expects to be discharged to:: Private residence Living Arrangements: Spouse/significant other Available Help at Discharge: Family (Daughter/wife) Type of Home: House Home Access: Stairs to enter Secretary/administrator of Steps: 4 Entrance Stairs-Rails: Can reach both (They are unstable) Home Layout: One level Bathroom Shower/Tub: Tub/shower unit Home Equipment: Grab bars - toilet, Grab bars - tub/shower, Hand held shower head, Shower seat, Cane - single point Additional Comments: a/o x4  Functional History: Prior Function Prior Level of Function : Independent/Modified Independent, History of Falls (last six months), Driving Mobility Comments: Cane PRN when legs feel weak ADLs Comments: Indep Functional Status:  Mobility: Bed Mobility Overal bed mobility: Needs Assistance Bed Mobility: Sidelying to Sit, Rolling, Sit to Sidelying Rolling: Max assist, +2 for physical assistance, Used rails Sidelying to sit: Used rails, Max assist Sit to sidelying: Max assist, +2 for physical assistance General bed mobility comments: Log rolling technique utilized Transfers Overall transfer level: Needs assistance Equipment used: Rolling walker (2 wheels) Transfers: Sit to/from Stand Sit to Stand: Mod assist, +2 physical assistance, From elevated surface General transfer comment: Pt STS from elevated bed height with MODA+2 with RW. Pt stood for ~26mins before  overcome with pain and closed eyes and began sitting back down. MAXA +2 to return pt in bed.      ADL: ADL Overall ADL's : Needs assistance/impaired Eating/Feeding: Minimal assistance, Sitting Grooming: Wash/dry face, Maximal assistance (Wife completed while in bed) Lower Body Dressing: Maximal assistance, Sit to/from stand Functional mobility during ADLs: Rolling walker (2 wheels), +2 for physical assistance, Minimal assistance (RW; Min A for STS, MAXA to remain standing due to onset nausa, and increased level of pain when standing.) General ADL Comments: MAXA donning briefs in standing while pt holding onto walker, MODA x1 for standing stability.  Cognition: Cognition Orientation Level: Oriented X4 Cognition Arousal: Suspect due to medications Behavior During Therapy: WFL for tasks assessed/performed  Blood pressure 139/81, pulse 90, temperature 98.1 F (36.7 C), temperature source Oral, resp. rate 16, height 5\' 7"  (1.702 m), weight 100.7 kg, SpO2 97%. Physical Exam Gen: no distress, normal appearing HEENT: oral mucosa pink and moist, NCAT Cardio: Reg rate Chest: normal effort, normal rate of breathing Abd: soft, non-distended Ext: no edema Psych: pleasant, normal affect Skin: intact Neuro: Alert and oriented x3. 4/5 strength throughout.  Results for orders placed or performed during the hospital encounter of 04/30/23 (from the past 24 hours)  Glucose, capillary     Status: Abnormal   Collection Time: 04/30/23  6:16 PM  Result Value Ref Range   Glucose-Capillary 143 (H) 70 - 99 mg/dL  Basic metabolic panel     Status: Abnormal   Collection Time: 05/01/23  4:52 AM  Result Value Ref Range   Sodium 135 135 - 145 mmol/L   Potassium 3.6 3.5 - 5.1 mmol/L   Chloride 107 98 - 111 mmol/L   CO2 19 (L) 22 - 32 mmol/L   Glucose, Bld 120 (H) 70 - 99 mg/dL   BUN 17 8 - 23 mg/dL   Creatinine, Ser 0.86 0.61 - 1.24 mg/dL   Calcium 8.2 (L) 8.9 - 10.3 mg/dL   GFR, Estimated >57 >84  mL/min   Anion gap 9 5 - 15   DG Cervical Spine 2-3 Views Result Date: 04/30/2023 CLINICAL DATA:  Elective surgery. EXAM: CERVICAL SPINE - 2-3 VIEW COMPARISON:  Radiograph 04/03/2023 FINDINGS: Eight fluoroscopic spot views of the cervical spine submitted from the operating room. There has been previous anterior C3 through C6 fusion. Interval posterior fusion extending from C2 through the upper thoracic spine. Fluoroscopy time 46.2 seconds. Dose 22.57 mGy. IMPRESSION: Intraoperative fluoroscopy during cervical fusion. Electronically Signed   By: Narda Rutherford M.D.   On: 04/30/2023 20:23   DG C-Arm 1-60 Min-No Report Result Date: 04/30/2023 Fluoroscopy was utilized by the requesting physician.  No radiographic interpretation.   DG C-Arm 1-60 Min-No Report Result Date: 04/30/2023 Fluoroscopy was utilized by the requesting physician.  No radiographic interpretation.   DG C-Arm 1-60 Min-No Report Result Date: 04/30/2023 Fluoroscopy was utilized by the requesting physician.  No radiographic interpretation.   DG C-Arm 1-60 Min-No Report Result Date: 04/30/2023 Fluoroscopy was utilized by the requesting physician.  No radiographic interpretation.   DG C-Arm 1-60 Min-No Report Result Date: 04/30/2023 Fluoroscopy was utilized by the requesting physician.  No radiographic interpretation.   DG C-Arm 1-60 Min-No Report Result Date: 04/30/2023 Fluoroscopy was utilized by the requesting physician.  No radiographic interpretation.   DG C-Arm 1-60 Min-No Report Result Date: 04/30/2023 Fluoroscopy was utilized by the requesting physician.  No radiographic interpretation.   DG C-Arm 1-60 Min-No Report Result Date: 04/30/2023 Fluoroscopy was utilized by the requesting physician.  No radiographic interpretation.    Assessment/Plan: Diagnosis: C6-C7 anterior cervical discectomy and fusion, C2-T2 posterior spinal fusion, C2-C4 laminectomy Does the need for close, 24 hr/day medical supervision in concert  with the patient's rehab needs make it unreasonable for this patient to be served in a less intensive setting? Yes Co-Morbidities requiring supervision/potential complications:  1) postoperative pain: continue scheduled tylenol 2) Obesity: provide dietary education 3) constipation: continue senna 4) Hyperglycemia 2/2 type 2 diabetes: provide dietary education 5) HTN Due to bladder management, bowel management, safety, skin/wound care, disease management, medication administration, pain management, and patient education, does the patient require 24 hr/day rehab nursing? Yes Does the patient require coordinated care of a physician, rehab nurse, therapy disciplines of PT, OT to address physical and functional deficits in the context of the above medical diagnosis(es)? Yes Addressing deficits in the following areas: balance, endurance, locomotion, strength, transferring, bowel/bladder control, bathing, dressing, feeding, grooming, toileting, and psychosocial support Can the patient actively participate in an intensive therapy program of at least 3 hrs of therapy per day at least 5 days per week? Yes The potential for patient to  make measurable gains while on inpatient rehab is excellent Anticipated functional outcomes upon discharge from inpatient rehab are min assist  with PT, supervision with OT, supervision with SLP. Estimated rehab length of stay to reach the above functional goals is: 14-16 days Anticipated discharge destination: Home Overall Rehab/Functional Prognosis: excellent  POST ACUTE RECOMMENDATIONS: This patient's condition is appropriate for continued rehabilitative care in the following setting: CIR Patient has agreed to participate in recommended program. Yes Note that insurance prior authorization may be required for reimbursement for recommended care.   I have personally performed a face to face diagnostic evaluation of this patient. Additionally, I have examined the patient's  medical record including any pertinent labs and radiographic images.    Thanks,  Horton Chin, MD 05/01/2023

## 2023-05-02 DIAGNOSIS — R29898 Other symptoms and signs involving the musculoskeletal system: Secondary | ICD-10-CM

## 2023-05-02 DIAGNOSIS — M4712 Other spondylosis with myelopathy, cervical region: Secondary | ICD-10-CM

## 2023-05-02 DIAGNOSIS — M4802 Spinal stenosis, cervical region: Secondary | ICD-10-CM | POA: Diagnosis not present

## 2023-05-02 DIAGNOSIS — M960 Pseudarthrosis after fusion or arthrodesis: Secondary | ICD-10-CM | POA: Diagnosis not present

## 2023-05-02 MED ORDER — DEXAMETHASONE SODIUM PHOSPHATE 4 MG/ML IJ SOLN
4.0000 mg | Freq: Four times a day (QID) | INTRAMUSCULAR | Status: AC
  Administered 2023-05-02 – 2023-05-03 (×4): 4 mg via INTRAVENOUS
  Filled 2023-05-02 (×5): qty 1

## 2023-05-02 NOTE — Progress Notes (Signed)
 Physical Therapy Treatment Patient Details Name: Caleb Wall MRN: [id number] DOB: 09-Mar-1955 Today's Date: 05/02/2023   History of Present Illness 68 y/o male s/p C6-7 ACDF, C2-T2 posterior fusio, C2-C4 laminectomy 3/11.  H/o C3-6 ACDF 12/2020.    PT Comments  Pt much more alert and aware today, continues to report significant neck pain but did tolerate mobility/activity better today.  He was able to get up to sitting EOB with only minA, still needing elevated surface and light assist to get to standing.  Poor awareness/control during ambulation, heavy walker use and increasing neck pain with increasing distance/time in standing.  Pt will benefit from ongoing PT to address functional limitations.      If plan is discharge home, recommend the following: A lot of help with bathing/dressing/bathroom;Assistance with cooking/housework;Assist for transportation;Help with stairs or ramp for entrance;A lot of help with walking and/or transfers   Can travel by private vehicle        Equipment Recommendations  Rolling walker (2 wheels)    Recommendations for Other Services       Precautions / Restrictions Precautions Precautions: Cervical Restrictions Weight Bearing Restrictions Per Provider Order: No     Mobility  Bed Mobility Overal bed mobility: Needs Assistance Bed Mobility: Supine to Sit Rolling: Min assist Sidelying to sit: Used rails, Min assist       General bed mobility comments: Pt more alert and able to follow cuing today, heavy use of rail and extra time to get up to sitting EOB    Transfers Overall transfer level: Needs assistance Equipment used: Rolling walker (2 wheels) Transfers: Sit to/from Stand Sit to Stand: Min assist, From elevated surface           General transfer comment: unable to rise to standing from standard height bed  further cuing for set up and sequencing and ~2" elevation with minA to rise    Ambulation/Gait Ambulation/Gait assistance:  Mod assist, Min assist Gait Distance (Feet): 30 Feet Assistive device: Rolling walker (2 wheels)         General Gait Details: Pt c/o neck pain and generally feeling weak, but was able to ambulate ~30 ft with slow, guarded, walker-reliant gait.  He displayed small scale knee buckling that he did self arrrest, but ultimately he fatigued quickly and needed to sit relatively abruptly   Optometrist     Tilt Bed    Modified Rankin (Stroke Patients Only)       Balance Overall balance assessment: History of Falls, Needs assistance Sitting-balance support: Bilateral upper extremity supported, Feet supported Sitting balance-Leahy Scale: Good       Standing balance-Leahy Scale: Fair Standing balance comment: highly reliant on the walker, mild LE buckling                            Communication Communication Communication: No apparent difficulties  Cognition Arousal: Alert Behavior During Therapy: WFL for tasks assessed/performed, Impulsive                           PT - Cognition Comments: Pt much more alert and aware this date Following commands: Impaired Following commands impaired:  (still somewhat impulsive but much improved from yesterday)    Cueing    Exercises      General Comments General comments (skin integrity, edema, etc.): Pt willing  to particiapte, shows moderate impulsivity, inconsistent quality of movement and following cues      Pertinent Vitals/Pain Pain Assessment Pain Assessment: 0-10 Pain Score: 6  Pain Location: Neck (increases with any/all activity)    Home Living                          Prior Function            PT Goals (current goals can now be found in the care plan section) Progress towards PT goals: Progressing toward goals    Frequency    Min 3X/week      PT Plan      Co-evaluation              AM-PAC PT "6 Clicks" Mobility   Outcome Measure   Help needed turning from your back to your side while in a flat bed without using bedrails?: A Little Help needed moving from lying on your back to sitting on the side of a flat bed without using bedrails?: A Little Help needed moving to and from a bed to a chair (including a wheelchair)?: A Lot Help needed standing up from a chair using your arms (e.g., wheelchair or bedside chair)?: A Lot Help needed to walk in hospital room?: A Lot Help needed climbing 3-5 steps with a railing? : Total 6 Click Score: 13    End of Session Equipment Utilized During Treatment: Gait belt Activity Tolerance: Patient limited by fatigue;Patient limited by pain Patient left: with call bell/phone within reach;with chair alarm set Nurse Communication: Mobility status PT Visit Diagnosis: Muscle weakness (generalized) (M62.81);Difficulty in walking, not elsewhere classified (R26.2);Unsteadiness on feet (R26.81);Pain;Other symptoms and signs involving the nervous system (R29.898) Pain - part of body:  (cervicalgia)     Time: 6962-9528 PT Time Calculation (min) (ACUTE ONLY): 18 min  Charges:    $Gait Training: 8-22 mins PT General Charges $$ ACUTE PT VISIT: 1 Visit                     Malachi Pro, DPT 05/02/2023, 1:02 PM

## 2023-05-02 NOTE — Progress Notes (Unsigned)
   REFERRING PHYSICIAN:  [facility] [address] [place],  [place] [address]  DOS: 04/30/23 ACDF C6-C7, C2-C4 laminectomies, C2-T2 PSF  HISTORY OF PRESENT ILLNESS: Caleb Wall is 2 weeks status post above surgery.   He went to rehab postop and was discharged on 05/10/23.  He was given flexeril, neurontin, and oxycodone on discharge.   Numbness/tingling in his arms is better. He still having neck pain. His arm pain is gone! No weakness in his arms.   He stopped neurontin- made his fingers lock up. Feels like flexeril is making it hard for him to void.   He is taking oxycodone- 3-4 per day. He take tylenol prn as well.   PHYSICAL EXAMINATION:  NEUROLOGICAL:  General: In no acute distress.   Awake, alert, oriented to person, place, and time.  Pupils equal round and reactive to light.  Facial tone is symmetric.    Strength: Side Biceps Triceps Deltoid Interossei Grip Wrist Ext. Wrist Flex.  R 4+ 4+ 4 5 5 5 5   L 5 5 4+ 5 5 5 5    Side Iliopsoas Quads Hamstring PF DF EHL  R 5 5 5 5 5 5   L 5 5 5 5 5 5     Incisions c/d/I, staples removed from posterior incision prior to seeing me.    He is using walker to ambulate. Has soft collar.   Imaging:  Nothing new to review.   Assessment / Plan: Schneur Crowson is doing well s/p above surgery. Treatment options reviewed with patient and following plan made:   - We discussed activity escalation and I have advised the patient to lift up to 10 pounds until 6 weeks after surgery (until follow up with Dr. Katrinka Blazing).   - Reviewed wound care.  - Continue current medications including prn oxycodone. Refill given. PMP reviewed and is appropriate.  - Stop neurontin and flexeril due to side effects.  - Continue with collar when up and walking.  - He starts PT tomorrow.  - Follow up as scheduled in 4 weeks and prn. Will need xrays prior.  Advised to contact the office if any questions or concerns arise.   BP was elevated. No  symptoms of chest pain, shortness of breath, blurry vision, or headaches. He will recheck at home and call PCP if not improved. If he develops CP, SOB, blurry vision, or headaches, then he will go to ED.     Drake Leach PA-C Dept of Neurosurgery

## 2023-05-02 NOTE — Progress Notes (Signed)
   Neurosurgery Progress Note  History: Caleb Wall is s/p C6-7 ACDF and C2-4 laminectomies, C2-T2 PSF  POD2: Continues neck soreness and some worsening arm weakness this morning POD1: NAEO. Pt reported expected pain in his neck  Physical Exam: Vitals:   05/02/23 0323 05/02/23 0822  BP: (!) 146/85 (!) 158/74  Pulse: 92 95  Resp: 18 17  Temp: 98.5 F (36.9 C) 100.2 F (37.9 C)  SpO2: 97% 98%    AA Ox3 CNI Strength: Side Biceps Triceps Deltoid Interossei Grip Wrist Ext. Wrist Flex.  R 4 5 3 4 5 5 5   L 5 5 4- 4 5 5 5     Side Iliopsoas Quads Hamstring PF DF EHL  R 4 5 5 4 4 5   L 5 5 5 4 4 5     Anterior drain 20 since surgery Posterior drain 75 since surgery Data:  Other tests/results: NA  Assessment/Plan:  Caleb Wall is a 68 y.o presenting with cervical stenosis and myelopathy status post C6-7 ACDF and C2-4 laminectomies, C2-T2 PSF  - mobilize - pain control - DVT prophylaxis - Will remove anterior drain this morning. Continue posterior drain - started a short course of dex for deltoid weakness. Will continue to monior - PTOT  Manning Charity PA-C Department of Neurosurgery

## 2023-05-02 NOTE — Progress Notes (Signed)
 Occupational Therapy Treatment Patient Details Name: Caleb Wall MRN: [id number] DOB: September 14, 1955 Today's Date: 05/02/2023   History of present illness 68 y/o male s/p C6-7 ACDF, C2-T2 posterior fusio, C2-C4 laminectomy 3/11.  H/o C3-6 ACDF 12/2020.   OT comments  Caleb Wall was seen for OT treatment on this date. Upon arrival to room pt seated in chair, agreeable to tx. Pt requires MIN A + RW for ADL t/f ~20 ft, poor safety awareness and terminated further mobility 2/2 multiple LOBs requiring MOD A to correct. MIN A urinal use in sitting, difficulty locating items, limited neck movement appears to be limiting vision. Pt making good progress toward goals, will continue to follow POC. Discharge recommendation remains appropriate.        If plan is discharge home, recommend the following:  A lot of help with walking and/or transfers;A lot of help with bathing/dressing/bathroom;Assistance with feeding;Assistance with cooking/housework;Direct supervision/assist for medications management;Assist for transportation   Equipment Recommendations  Other (comment) (defer)    Recommendations for Other Services      Precautions / Restrictions Precautions Precautions: Cervical Restrictions Weight Bearing Restrictions Per Provider Order: No       Mobility Bed Mobility Overal bed mobility: Needs Assistance Bed Mobility: Sit to Supine         Sit to sidelying: Min assist      Transfers Overall transfer level: Needs assistance Equipment used: Rolling walker (2 wheels) Transfers: Sit to/from Stand Sit to Stand: Min assist                 Balance Overall balance assessment: Needs assistance Sitting-balance support: No upper extremity supported, Feet supported Sitting balance-Leahy Scale: Good     Standing balance support: Bilateral upper extremity supported Standing balance-Leahy Scale: Poor                             ADL either performed or assessed with  clinical judgement   ADL Overall ADL's : Needs assistance/impaired                                       General ADL Comments: MIN A + RW for ADL t/f ~20 ft, poor safety awareness and terminated further mobility 2/2 multiple LOBs requiring MOD A. MIN A urinal use in sitting     Communication Communication Communication: No apparent difficulties   Cognition Arousal: Alert Behavior During Therapy: WFL for tasks assessed/performed Cognition: No apparent impairments                               Following commands: Impaired Following commands impaired: Follows multi-step commands inconsistently                    Pertinent Vitals/ Pain       Pain Assessment Pain Assessment: Faces Faces Pain Scale: Hurts a little bit Pain Location: Neck Pain Descriptors / Indicators: Sharp Pain Intervention(s): Repositioned, Premedicated before session   Frequency  Min 3X/week        Progress Toward Goals  OT Goals(current goals can now be found in the care plan section)  Progress towards OT goals: Progressing toward goals  Acute Rehab OT Goals OT Goal Formulation: With patient Time For Goal Achievement: 05/15/23 Potential to Achieve Goals: Good ADL Goals Pt Will Perform Grooming:  standing;with supervision Pt Will Perform Lower Body Dressing: with adaptive equipment;with supervision Pt Will Transfer to Toilet: with contact guard assist;ambulating Pt Will Perform Toileting - Clothing Manipulation and hygiene: with min assist;sitting/lateral leans  Plan      Co-evaluation                 AM-PAC OT "6 Clicks" Daily Activity     Outcome Measure   Help from another person eating meals?: A Little Help from another person taking care of personal grooming?: A Lot Help from another person toileting, which includes using toliet, bedpan, or urinal?: A Lot Help from another person bathing (including washing, rinsing, drying)?: A Lot Help from  another person to put on and taking off regular upper body clothing?: A Little Help from another person to put on and taking off regular lower body clothing?: A Lot 6 Click Score: 14    End of Session Equipment Utilized During Treatment: Gait belt;Rolling walker (2 wheels)  OT Visit Diagnosis: Unsteadiness on feet (R26.81);Other abnormalities of gait and mobility (R26.89);Repeated falls (R29.6);Muscle weakness (generalized) (M62.81);History of falling (Z91.81);Pain   Activity Tolerance Patient tolerated treatment well   Patient Left in bed;with call bell/phone within reach;with bed alarm set;with family/visitor present   Nurse Communication Mobility status        Time: 1610-9604 OT Time Calculation (min): 22 min  Charges: OT General Charges $OT Visit: 1 Visit OT Treatments $Self Care/Home Management : 8-22 mins  Kathie Dike, M.S. OTR/L  05/02/23, 1:09 PM  ascom [phone]

## 2023-05-02 NOTE — PMR Pre-admission (Signed)
 PMR Admission Coordinator Pre-Admission Assessment  Patient: Caleb Wall is an 68 y.o., male MRN: [id number] DOB: [birth year] Height: 5\' 7"  (170.2 cm) Weight: 100.7 kg              Insurance Information HMO: yes    PPO:      PCP:      IPA:      80/20:      OTHER:  PRIMARY: [employer]      Policy#: [id number]      Subscriber: pt CM Name: faxed approval      Phone#: [phone]     Fax#: [phone] Pre-Cert#: [id number] auth for CIR via faxed approval with updates due to fax listed above on 3/18 (5 days from admit)      Employer:  Benefits:  Phone #: [phone]     Name:  Eff. Date: 02/20/23     Deduct: $0      Out of Pocket Max: $4000 (met $211.85)      Life Max:   CIR: $275/day for 5 days      SNF: $20/day for 20 days, then $214/day for 40 days Outpatient:      Co-Pay: $20/visit Home Health: 100%      Co-Pay:  DME: 80%     Co-Pay: 20% Providers:  SECONDARY:       Policy#:       Phone#:   Artist:       Phone#:   The Engineer, materials Information Summary" for patients in Inpatient Rehabilitation Facilities with attached "Privacy Act Statement-Health Care Records" was provided and verbally reviewed with: Patient and Family  Emergency Contact Information Contact Information     Name Relation Home Work Mobile   [place] Spouse [phone]  [phone]      Other Contacts   None on File    Current Medical History  Patient Admitting Diagnosis: cervical myelopathy  History of Present Illness: Pt is a 68 y/o male with PMH of cervical myelopathy, HTN, BPH admitted to [facility] on 04/30/23 for anterior cervical discectomy and fusion of C2-T2 and posterior spinal fusion of C2-4 for management of cervical myelopathy.  Hospital course pain management, bowel management, diabetes and HTN management.  Therapy evaluations completed and pt was recommended for CIR.    Glasgow Coma Scale Score: 15  Patient's medical record from [facility] has been reviewed by the rehabilitation  admission coordinator and physician.  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 Yes  Family History  family history includes Cancer in his maternal uncle; Diabetes in his maternal aunt and paternal aunt; Heart disease in his mother; Hypertension in his mother.   Current Medications   Current Facility-Administered Medications:    acetaminophen (TYLENOL) tablet 650 mg, 650 mg,  Oral, Q4H PRN, 650 mg at 05/02/23 0320 **OR** acetaminophen (TYLENOL) suppository 650 mg, 650 mg, Rectal, Q4H PRN, Frisbie, Brooke, PA-C   alfuzosin (UROXATRAL) 24 hr tablet 10 mg, 10 mg, Oral, Daily, Frisbie, Brooke, PA-C, 10 mg at 05/01/23 1144   alum & mag hydroxide-simeth (MAALOX/MYLANTA) 200-200-20 MG/5ML suspension 30 mL, 30 mL, Oral, Q6H PRN, Frisbie, Brooke, PA-C   bisacodyl (DULCOLAX) EC tablet 10 mg, 10 mg, Oral, Daily PRN, Frisbie, Brooke, PA-C   brimonidine (ALPHAGAN) 0.2 % ophthalmic solution 1 drop, 1 drop, Both Eyes, BID, Frisbie, Brooke, PA-C, 1 drop at 05/01/23 2128   docusate sodium (COLACE) capsule 100 mg, 100 mg, Oral, BID, Frisbie, Brooke, PA-C, 100 mg at 05/01/23 2128   dorzolamide-timolol (COSOPT) 2-0.5 % ophthalmic solution 1 drop, 1 drop, Both Eyes, BID, Frisbie, Brooke, PA-C, 1 drop at 05/01/23 2128   enoxaparin (LOVENOX) injection 40 mg, 40 mg, Subcutaneous, Q24H, Frisbie, Brooke, PA-C,  40 mg at 05/01/23 1610   gabapentin (NEURONTIN) capsule 300 mg, 300 mg, Oral, TID, Frisbie, Brooke, PA-C, 300 mg at 05/01/23 2128   HYDROmorphone (DILAUDID) injection 0.5 mg, 0.5 mg, Intravenous, Q3H PRN, Frisbie, Brooke, PA-C, 0.5 mg at 05/01/23 0157   latanoprost (XALATAN) 0.005 % ophthalmic solution 1 drop, 1 drop, Both Eyes, QHS, Frisbie, Brooke, PA-C, 1 drop at 05/01/23 2129   magnesium citrate solution 1 Bottle, 1 Bottle, Oral, Once PRN, Frisbie, Brooke, PA-C   menthol-cetylpyridinium (CEPACOL) lozenge 3 mg, 1 lozenge, Oral, PRN **OR** phenol (CHLORASEPTIC) mouth spray 1 spray, 1 spray, Mouth/Throat, PRN, Frisbie, Brooke, PA-C   methocarbamol (ROBAXIN) tablet 500 mg, 500 mg, Oral, Q6H PRN **OR** methocarbamol (ROBAXIN) injection 500 mg, 500 mg, Intravenous, Q6H PRN, Frisbie, Brooke, PA-C, 500 mg at 04/30/23 2000   nitrofurantoin (macrocrystal-monohydrate) (MACROBID) capsule 100 mg, 100 mg, Oral, Daily, Frisbie, Brooke, PA-C, 100 mg at 05/01/23 1144   ondansetron (ZOFRAN) tablet 4 mg, 4 mg, Oral, Q6H PRN **OR** ondansetron (ZOFRAN) injection 4 mg, 4 mg, Intravenous, Q6H PRN, Frisbie, Brooke, PA-C, 4 mg at 05/01/23 9604   Oral care mouth rinse, 15 mL, Mouth Rinse, PRN, Lovenia Kim, MD   oxyCODONE (Oxy IR/ROXICODONE) immediate release tablet 10 mg, 10 mg, Oral, Q3H PRN, Frisbie, Brooke, PA-C, 10 mg at 05/02/23 5409   oxyCODONE (Oxy IR/ROXICODONE) immediate release tablet 5 mg, 5 mg, Oral, Q3H PRN, Frisbie, Brooke, PA-C   pantoprazole (PROTONIX) injection 40 mg, 40 mg, Intravenous, QHS, Frisbie, Brooke, PA-C, 40 mg at 05/01/23 2128   senna (SENOKOT) tablet 8.6 mg, 1 tablet, Oral, BID, Frisbie, Brooke, PA-C, 8.6 mg at 05/01/23 2128   senna-docusate (Senokot-S) tablet 1 tablet, 1 tablet, Oral, QHS PRN, Frisbie, Brooke, PA-C   sodium chloride flush (NS) 0.9 % injection 3 mL, 3 mL, Intravenous, Q12H, Frisbie, Brooke, PA-C, 3 mL at 05/01/23 2129   sodium chloride flush (NS) 0.9 % injection 3 mL, 3  mL, Intravenous, PRN, Frisbie, Brooke, PA-C   tadalafil (CIALIS) tablet 2.5 mg, 2.5 mg, Oral, Daily, Frisbie, Brooke, PA-C, 2.5 mg at 05/01/23 0000  Patients Current Diet:  Diet Order             Diet Heart Room service appropriate? Yes; Fluid consistency: Thin  Diet effective now                   Precautions / Restrictions Precautions Precautions: Cervical Restrictions Weight Bearing Restrictions Per Provider Order: No   Has the patient had 2 or more falls or a fall with injury in the  past year?Yes  Prior Activity Level Limited Community (1-2x/wk): independent with occasional cane in community, driving  Prior Functional Level Prior Function Prior Level of Function : Independent/Modified Independent, History of Falls (last six months), Driving Mobility Comments: Cane PRN when legs feel weak ADLs Comments: Indep  Self Care: Did the patient need help bathing, dressing, using the toilet or eating?  Independent  Indoor Mobility: Did the patient need assistance with walking from room to room (with or without device)? Independent  Stairs: Did the patient need assistance with internal or external stairs (with or without device)? Independent  Functional Cognition: Did the patient need help planning regular tasks such as shopping or remembering to take medications? Independent  Patient Information Are you of Hispanic, Latino/a,or Spanish origin?: A. No, not of Hispanic, Latino/a, or Spanish origin What is your race?: B. Black or African American Do you need or want an interpreter to communicate with a doctor or health care staff?: 0. No  Patient's Response To:  Health Literacy and Transportation Is the patient able to respond to health literacy and transportation needs?: Yes Health Literacy - How often do you need to have someone help you when you read instructions, pamphlets, or other written material from your doctor or pharmacy?: Never In the past 12 months, has lack of  transportation kept you from medical appointments or from getting medications?: No In the past 12 months, has lack of transportation kept you from meetings, work, or from getting things needed for daily living?: No  Home Assistive Devices / Equipment Home Equipment: Grab bars - toilet, Grab bars - tub/shower, Hand held shower head, Shower seat, Cane - single point  Prior Device Use: Indicate devices/aids used by the patient prior to current illness, exacerbation or injury?  cane  Current Functional Level Cognition  Orientation Level: Oriented X4    Extremity Assessment (includes Sensation/Coordination)  Upper Extremity Assessment: Generalized weakness (expected post surgical hesitancy, able to functionally use for mobility, mentation limited formal testing)  Lower Extremity Assessment: Generalized weakness, Overall WFL for tasks assessed (functional and seemingly near symmetrical LE strength)    ADLs  Overall ADL's : Needs assistance/impaired Eating/Feeding: Minimal assistance, Sitting Grooming: Wash/dry face, Maximal assistance (Wife completed while in bed) Lower Body Dressing: Maximal assistance, Sit to/from stand Functional mobility during ADLs: Rolling walker (2 wheels), +2 for physical assistance, Minimal assistance (RW; Min A for STS, MAXA to remain standing due to onset nausa, and increased level of pain when standing.) General ADL Comments: MAXA donning briefs in standing while pt holding onto walker, MODA x1 for standing stability.    Mobility  Overal bed mobility: Needs Assistance Bed Mobility: Supine to Sit, Sit to Supine Rolling: Max assist, +2 for physical assistance, Used rails Sidelying to sit: Used rails, Max assist Supine to sit: Mod assist Sit to supine: Max assist Sit to sidelying: Max assist, +2 for physical assistance General bed mobility comments: Pt showed good effort but was ultimately very limited, needed heavy assist with all aspects of trying to get to/from  supine    Transfers  Overall transfer level: Needs assistance Equipment used: Rolling walker (2 wheels) Transfers: Sit to/from Stand Sit to Stand: Mod assist, From elevated surface General transfer comment: unable to rise to standing from standard height bed and modA, further cuing for set up and sequencing and ~2" elevation with modA to rise    Ambulation / Gait / Stairs / Wheelchair Mobility  Ambulation/Gait General Gait Details: pt's mentation, pain and weakness all  preculded ambulation away from the bed today but he did manage to take a few side steps along EOB with heavy cuing and plenty of UE use on walker.  Attempted marching in place, manage X 2 b/l (knee to RW cross bar) but had buckling and further attempts deferred for safety and pain considerations    Posture / Balance Balance Overall balance assessment: History of Falls, Needs assistance Sitting-balance support: Bilateral upper extremity supported, Feet supported Sitting balance-Leahy Scale: Fair Postural control: Right lateral lean Standing balance support: Bilateral upper extremity supported, During functional activity, Reliant on assistive device for balance Standing balance-Leahy Scale: Poor    Special needs/care consideration Skin surgical incision and Diabetic management yes     Previous Home Environment (from acute therapy documentation) Living Arrangements: Spouse/significant other Available Help at Discharge: Family Type of Home: House Home Layout: One level Home Access: Stairs to enter Entrance Stairs-Rails: Can reach both Entrance Stairs-Number of Steps: 4 Bathroom Shower/Tub: Tub/shower unit Home Care Services: No Additional Comments: a/o x4  Discharge Living Setting Plans for Discharge Living Setting: Patient's home, Lives with (comment) (spouse) Type of Home at Discharge: House Discharge Home Layout: One level Discharge Home Access: Stairs to enter Entrance Stairs-Rails: Can reach both Entrance  Stairs-Number of Steps: 4 Discharge Bathroom Shower/Tub: Tub/shower unit Discharge Bathroom Toilet: Handicapped height Discharge Bathroom Accessibility: Yes How Accessible: Accessible via walker Does the patient have any problems obtaining your medications?: No  Social/Family/Support Systems Patient Roles: Spouse Anticipated Caregiver: spouse, [name]  [phone] Anticipated Caregiver's Contact Information: see above Ability/Limitations of Caregiver: min assist at the absolute most Caregiver Availability: 24/7 Discharge Plan Discussed with Primary Caregiver: Yes Is Caregiver In Agreement with Plan?: Yes Does Caregiver/Family have Issues with Lodging/Transportation while Pt is in Rehab?: No   Goals Patient/Family Goal for Rehab: PT/OT supervision to min assist, SLP n/a Expected length of stay: 14-16 days Additional Information: Discharge plan: home with spouse 24/7, can provide up to min assist for mobility Pt/Family Agrees to Admission and willing to participate: Yes Program Orientation Provided & Reviewed with Pt/Caregiver Including Roles  & Responsibilities: Yes   Decrease burden of Care through IP rehab admission: n/a   Possible need for SNF placement upon discharge:Not anticipated.  Plan for discharge home with spouse providing 24/7 assist and daughter available to assist PRN.    Patient Condition: This patient's condition remains as documented in the consult dated 05/01/23, in which the Rehabilitation Physician determined and documented that the patient's condition is appropriate for intensive rehabilitative care in an inpatient rehabilitation facility. Will admit to inpatient rehab today.  Preadmission Screen Completed By:  Stephania Fragmin, PT, DPT 05/02/2023 8:42 AM ______________________________________________________________________   Discussed status with Dr. Riley Kill on 05/03/23 at  11:14 AM  and received approval for admission today.  Admission Coordinator:  Stephania Fragmin, time 11:14 AM /Date03/14/25

## 2023-05-02 NOTE — Progress Notes (Signed)
 Inpatient Rehab Admissions Coordinator:   Insurance auth pending, and await confirmation from wife they want to proceed with CIR, will follow.    Estill Dooms, PT, DPT Admissions Coordinator [phone] 05/02/23  10:19 AM

## 2023-05-02 NOTE — Op Note (Addendum)
 Indications: 68 year old man with history of anterior cervical discectomy and fusion with progressive myelo radiculopathy found to have adjacent level disease and pseudoarthrosis of the cervical spine.  Given his progressive myelopathy and loss of function he was taken to the operating room for a anterior cervical discectomy and fusion at the adjacent level in the posterior cervical thoracic decompression and fusion  Findings: Hypermobility noted at the C6-7 level on the anterior approach.  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, cervical, with myelopathy Z98.1 S/P cervical spinal fusionM48.02 Spinal stenosis in cervical regionS12.9XXS Pseudoarthrosis of cervical spine, sequela   EBL: 950 ml IVF: See anesthesia report Drains: Anterior cervical deep drain, posterior cervical subfascial drain Disposition: Extubated and Stable to PACU Complications: none   Preoperative Note:  Risks of surgery discussed include: infection, bleeding, stroke, coma, death, paralysis, CSF leak, nerve/spinal cord injury, numbness, tingling, weakness, complex regional pain syndrome, recurrent stenosis and/or disc herniation, vascular injury, development of instability, neck/back pain, need for further surgery, persistent symptoms, development of deformity, and the risks of anesthesia. The patient understood these risks and agreed to proceed.  Procedure:  1) Anterior cervical diskectomy and fusion at C6-7 2) placement of interbody device, coalition integrated graft 3) posterior segmental instrumentation C2-T2 4) posterior cervical thoracic arthrodesis, C2-T2, allograft, autograft, BMP 5) cervical laminectomy, C2-C4   Procedure: After obtaining informed consent,  the patient taken to the operating room, placed in supine position, general anesthesia induced.  The patient had a small shoulder roll placed behind their shoulders.  The patient received preop antibiotics and IV Decadron.  The patient had a neck incision outlined, was prepped and draped in usual sterile fashion. The incision was injected with local anesthetic.   An incision was opened, dissection taken down medial to the carotid artery and jugular vein, lateral to the trachea and esophagus.  The prevertebral fascia identified and a localizing x-ray demonstrated the correct level.  The longus colli were dissected laterally, and self-retaining retractors placed to open the operative field. The microscope was then brought into the field.  With this complete, we are able to clearly visualize the inferior aspect of his previous plating system.  There was no safe room for a Caspar pin to be placed at this level so used intradiscal distraction. Curettes and pituitary rongeurs used to remove the majority of disk, then the drill was used to remove the posterior osteophyte and begin the foraminotomies. The nerve hook was used to elevate the posterior longitudinal ligament, which was then removed with Kerrison rongeurs. The microblunt nerve hook could be passed out the foramen bilaterally.   Meticulous hemostasis obtained.  A integrated coalition graft was placed at the C6-7 level under fluoroscopic guidance.  Once this was in good position, shims were placed.  The initial graft was not able to be safely tamped into place so had to be downsized to 1 size smaller.  This was tapped into place at the C6-7 level.  The caspar distractor was removed, and bone wax used for hemostasis.  1 shim was placed into each vertebral body, respectively making sure the screws were behind the locking mechanism.  Final AP and lateral radiographs were taken.   With everything in good position, the wound was irrigated copiously and meticulous  hemostasis obtained.  Wound was closed in  2 layers using interrupted inverted 3-0 Vicryl sutures.  A subplatysmal drain was placed  Sponge and pattie counts were correct at the end of the procedure.   We then turned the patient prone and pins for the posterior portion of the procedure.  Once he was well-positioned, he was then prepped and draped in the usual fashion.  Midline incision was planned.  Is infiltrated with local anesthetic.  A secondary timeout was performed.  A midline incision was made from the base of his skull to his thoracic/cervical prominence.  Skin and subcutaneous tissues were dissected.  This was brought all the way down to the subperiosteal level bilaterally from C2-T2.  Self-retaining retractors were placed.  Given the confirmation of the bed and the habitus of the patient we are unable to utilize 3D navigation so continue to go forward with fluoroscopy guided instrumentation.  Starting at the C2 level we placed pars screws bilaterally.  We started this with the pilot hole with an MA drill, we aimed the space off of fluoroscopy and placed them after tapping each level.  We then placed lateral mass screws bilaterally at C3-C6, each of which had good purchase overall.  They were placed in the usual fashion with a pilot hole, ball-tipped probe, and placement of the screw aiming at the upper outer portion of the lateral mass.  Each of these had good purchase.  We then turned our attention to placing the thoracic pedicle screws.  At the intersection between the TP and pars we placed a pilot hole.  We used a pedicle finder to navigate her way down the pedicle by palpation.  We utilized fluoroscopy and made adjustments as needed.  Bilaterally T1-T2 were placed via a pedicle screw.  Once all hardware was in place we verified positioning with x-rays.  Adjustments were made until the x-rays show the screws in the safe positioning.  At this point we turned our attention to the  decompression.  A high-speed drill was used to perform trough laminotomies at the C3 and C4 level and an inferior laminectomy at the C2 level to the level of the pedicles.  The C3 and C4 lamina were removed en bloc and harvested for autograft.  The spinal cord at this point was well decompressed.  We remove the rest of the ligamentum flavum with a Kerrison rongeur at the C2 level.  We then irrigated copiously.  We took care to meticulously prepare the posterior facets and posterior elements that were remaining for arthrodesis with a high-speed drill.  We shaped rods and put them into place at 1 point needing a and a side connector on the right for a smoother connection.  We irrigated copiously, Irrisept was utilized.  Patient's autograft, allograft and BMP were placed in the facet line as well as posterior elements of the C2-T2 levels.  A subfascial drain was placed.  The wound was closed in multiple layers.  The top of the incision was closed with staples.   I performed the procedure with the assistance of [employer], they aided in the exposure, retraction of critical structures, visualization of critical structures, stabilization during instrumentation, and closure.  Implants: Implant Name Type Inv. Item Serial No. Manufacturer Lot No. LRB No. Used Action  ALLOGRAFT BONE FIBER KORE 1CC - [id number] Bone Implant ALLOGRAFT BONE FIBER KORE 1CC [id number] MUSCULOSKELETL TRANSPLANT FNDN  N/A 1 Implanted  ANCHOR GUIDE 12 COALITION - [id number] Anchor ANCHOR GUIDE 12 COALITION  [employer]  N/A 2 Implanted  SPACER LORD COAL [id number] 7D - [id number] Spacer SPACER LORD COAL [id number] 7D  [employer]  N/A 1 Implanted  SPACER LORD MIS [id number] 7D - [id number] Spacer SPACER LORD MIS [id number] 7D  [employer]  N/A 1 Implanted  ALLOGRAFT BONESTRIP KORE 2.5X5 - [id number] Bone Implant ALLOGRAFT BONESTRIP KORE 2.5X5 [id number] [facility] TRANSPLANT FNDN  N/A 1  Implanted  ALLOGRAFT BONESTRIP KORE 2.5X5 - [id number] Bone Implant ALLOGRAFT BONESTRIP KORE 2.5X5 [id number] MUSCULOSKELETL TRANSPLANT FNDN  N/A 1 Implanted  K[employer] - [id number] Orthopedic Implant [employer]  [employer] SOFAMOR DANEK [id number] N/A 1 Implanted  SCREW MA RELINE C 3.5X14 - [id number] Screw SCREW MA RELINE C 3.5X14  [employer]  N/A 8 Implanted  SCREW RELINE C MA 5.5X25 - [id number] Screw SCREW RELINE C MA 5.5X25  [employer]  N/A 2 Implanted  Reline screw 4.0x16 FA    [employer]  N/A 2 Implanted  Reline screw 4.5x26    [employer]  N/A 2 Implanted  reline c Ti rod, 3.5x111mm pre-bent    [employer]  N/A 3 Implanted  Reline C Connector, 11mm Offset Open    [employer]  N/A 1 Implanted  SCREW LOCK RELINE C OPEN - [id number] Screw SCREW LOCK RELINE C OPEN  [employer]  N/A 14 Implanted    Lovenia Kim, MD/MSCR

## 2023-05-03 ENCOUNTER — Inpatient Hospital Stay: Payer: Medicare (Managed Care)

## 2023-05-03 ENCOUNTER — Other Ambulatory Visit: Payer: Self-pay

## 2023-05-03 ENCOUNTER — Inpatient Hospital Stay (HOSPITAL_COMMUNITY)
Admission: AD | Admit: 2023-05-03 | Discharge: 2023-05-10 | DRG: 560 | Disposition: A | Discharge status: Home / Self Care | Payer: Medicare (Managed Care) | Source: Other Acute Inpatient Hospital | Attending: Physical Medicine and Rehabilitation | Admitting: Physical Medicine and Rehabilitation

## 2023-05-03 ENCOUNTER — Encounter (HOSPITAL_COMMUNITY): Payer: Self-pay | Admitting: Physical Medicine and Rehabilitation

## 2023-05-03 DIAGNOSIS — Z96642 Presence of left artificial hip joint: Secondary | ICD-10-CM | POA: Diagnosis present

## 2023-05-03 DIAGNOSIS — Z87891 Personal history of nicotine dependence: Secondary | ICD-10-CM

## 2023-05-03 DIAGNOSIS — N400 Enlarged prostate without lower urinary tract symptoms: Secondary | ICD-10-CM | POA: Diagnosis present

## 2023-05-03 DIAGNOSIS — Z8249 Family history of ischemic heart disease and other diseases of the circulatory system: Secondary | ICD-10-CM

## 2023-05-03 DIAGNOSIS — I10 Essential (primary) hypertension: Secondary | ICD-10-CM | POA: Diagnosis present

## 2023-05-03 DIAGNOSIS — Z7985 Long-term (current) use of injectable non-insulin antidiabetic drugs: Secondary | ICD-10-CM

## 2023-05-03 DIAGNOSIS — H401120 Primary open-angle glaucoma, left eye, stage unspecified: Secondary | ICD-10-CM | POA: Diagnosis present

## 2023-05-03 DIAGNOSIS — Z79899 Other long term (current) drug therapy: Secondary | ICD-10-CM | POA: Diagnosis not present

## 2023-05-03 DIAGNOSIS — R739 Hyperglycemia, unspecified: Secondary | ICD-10-CM | POA: Diagnosis not present

## 2023-05-03 DIAGNOSIS — K5901 Slow transit constipation: Secondary | ICD-10-CM | POA: Diagnosis not present

## 2023-05-03 DIAGNOSIS — N401 Enlarged prostate with lower urinary tract symptoms: Secondary | ICD-10-CM

## 2023-05-03 DIAGNOSIS — Z833 Family history of diabetes mellitus: Secondary | ICD-10-CM

## 2023-05-03 DIAGNOSIS — E119 Type 2 diabetes mellitus without complications: Secondary | ICD-10-CM | POA: Diagnosis present

## 2023-05-03 DIAGNOSIS — M7989 Other specified soft tissue disorders: Secondary | ICD-10-CM | POA: Diagnosis not present

## 2023-05-03 DIAGNOSIS — K5900 Constipation, unspecified: Secondary | ICD-10-CM | POA: Diagnosis present

## 2023-05-03 DIAGNOSIS — M4712 Other spondylosis with myelopathy, cervical region: Secondary | ICD-10-CM | POA: Diagnosis not present

## 2023-05-03 DIAGNOSIS — Z794 Long term (current) use of insulin: Secondary | ICD-10-CM

## 2023-05-03 DIAGNOSIS — Z791 Long term (current) use of non-steroidal anti-inflammatories (NSAID): Secondary | ICD-10-CM | POA: Diagnosis not present

## 2023-05-03 DIAGNOSIS — Z7982 Long term (current) use of aspirin: Secondary | ICD-10-CM

## 2023-05-03 DIAGNOSIS — G959 Disease of spinal cord, unspecified: Secondary | ICD-10-CM | POA: Diagnosis not present

## 2023-05-03 DIAGNOSIS — D62 Acute posthemorrhagic anemia: Secondary | ICD-10-CM | POA: Diagnosis present

## 2023-05-03 DIAGNOSIS — D72829 Elevated white blood cell count, unspecified: Secondary | ICD-10-CM | POA: Diagnosis not present

## 2023-05-03 DIAGNOSIS — K219 Gastro-esophageal reflux disease without esophagitis: Secondary | ICD-10-CM | POA: Diagnosis present

## 2023-05-03 DIAGNOSIS — E785 Hyperlipidemia, unspecified: Secondary | ICD-10-CM | POA: Diagnosis present

## 2023-05-03 DIAGNOSIS — R3911 Hesitancy of micturition: Secondary | ICD-10-CM

## 2023-05-03 DIAGNOSIS — Z4789 Encounter for other orthopedic aftercare: Principal | ICD-10-CM

## 2023-05-03 LAB — CBC
HCT: 27.2 % — ABNORMAL LOW (ref 39.0–52.0)
Hemoglobin: 9.3 g/dL — ABNORMAL LOW (ref 13.0–17.0)
MCH: 29.2 pg (ref 26.0–34.0)
MCHC: 34.2 g/dL (ref 30.0–36.0)
MCV: 85.5 fL (ref 80.0–100.0)
Platelets: 171 10*3/uL (ref 150–400)
RBC: 3.18 MIL/uL — ABNORMAL LOW (ref 4.22–5.81)
RDW: 13.2 % (ref 11.5–15.5)
WBC: 11.4 10*3/uL — ABNORMAL HIGH (ref 4.0–10.5)
nRBC: 0 % (ref 0.0–0.2)

## 2023-05-03 LAB — GLUCOSE, CAPILLARY: Glucose-Capillary: 200 mg/dL — ABNORMAL HIGH (ref 70–99)

## 2023-05-03 MED ORDER — SORBITOL 70 % SOLN
30.0000 mL | Freq: Every day | Status: DC | PRN
Start: 2023-05-03 — End: 2023-05-10
  Administered 2023-05-06 – 2023-05-08 (×2): 30 mL via ORAL
  Filled 2023-05-03 (×3): qty 30

## 2023-05-03 MED ORDER — DORZOLAMIDE HCL-TIMOLOL MAL 2-0.5 % OP SOLN
1.0000 [drp] | Freq: Two times a day (BID) | OPHTHALMIC | Status: DC
  Administered 2023-05-03 – 2023-05-10 (×14): 1 [drp] via OPHTHALMIC
  Filled 2023-05-03: qty 10

## 2023-05-03 MED ORDER — SENNOSIDES-DOCUSATE SODIUM 8.6-50 MG PO TABS
2.0000 | ORAL_TABLET | Freq: Every day | ORAL | Status: DC
  Administered 2023-05-03 – 2023-05-08 (×6): 2 via ORAL
  Filled 2023-05-03 (×6): qty 2

## 2023-05-03 MED ORDER — SENNA 8.6 MG PO TABS
1.0000 | ORAL_TABLET | Freq: Two times a day (BID) | ORAL | Status: DC
Start: 2023-05-03 — End: 2023-05-03

## 2023-05-03 MED ORDER — ONDANSETRON HCL 4 MG PO TABS: 4.0000 mg | ORAL_TABLET | Freq: Four times a day (QID) | ORAL | Status: DC | PRN

## 2023-05-03 MED ORDER — SENNA 8.6 MG PO TABS: 1.0000 | ORAL_TABLET | Freq: Two times a day (BID) | ORAL | Status: DC | PRN

## 2023-05-03 MED ORDER — LATANOPROST 0.005 % OP SOLN
1.0000 [drp] | Freq: Every day | OPHTHALMIC | Status: DC
  Administered 2023-05-03 – 2023-05-09 (×8): 1 [drp] via OPHTHALMIC
  Filled 2023-05-03: qty 2.5

## 2023-05-03 MED ORDER — ACETAMINOPHEN 650 MG RE SUPP: 650.0000 mg | RECTAL | Status: DC | PRN

## 2023-05-03 MED ORDER — ACETAMINOPHEN 325 MG PO TABS
650.0000 mg | ORAL_TABLET | ORAL | Status: DC | PRN
  Administered 2023-05-04 – 2023-05-09 (×13): 650 mg via ORAL
  Filled 2023-05-03 (×15): qty 2

## 2023-05-03 MED ORDER — METHOCARBAMOL 500 MG PO TABS
500.0000 mg | ORAL_TABLET | Freq: Four times a day (QID) | ORAL | Status: DC | PRN
  Administered 2023-05-05: 500 mg via ORAL
  Filled 2023-05-03: qty 1

## 2023-05-03 MED ORDER — NITROFURANTOIN MONOHYD MACRO 100 MG PO CAPS
100.0000 mg | ORAL_CAPSULE | Freq: Every day | ORAL | Status: DC
Start: 2023-05-04 — End: 2023-05-10
  Administered 2023-05-04 – 2023-05-10 (×7): 100 mg via ORAL
  Filled 2023-05-03 (×7): qty 1

## 2023-05-03 MED ORDER — OXYCODONE HCL 5 MG PO TABS: 5.0000 mg | ORAL_TABLET | ORAL | Status: DC | PRN

## 2023-05-03 MED ORDER — PANTOPRAZOLE SODIUM 40 MG PO TBEC
40.0000 mg | DELAYED_RELEASE_TABLET | Freq: Every day | ORAL | Status: DC
  Administered 2023-05-04 – 2023-05-10 (×7): 40 mg via ORAL
  Filled 2023-05-03 (×7): qty 1

## 2023-05-03 MED ORDER — METHOCARBAMOL 500 MG PO TABS: 500.0000 mg | ORAL_TABLET | Freq: Four times a day (QID) | ORAL | Status: DC | PRN

## 2023-05-03 MED ORDER — BISACODYL 10 MG RE SUPP
10.0000 mg | Freq: Every day | RECTAL | Status: DC | PRN
  Administered 2023-05-06: 10 mg via RECTAL
  Filled 2023-05-03: qty 1

## 2023-05-03 MED ORDER — OXYCODONE HCL 5 MG PO TABS
10.0000 mg | ORAL_TABLET | ORAL | Status: DC | PRN
  Administered 2023-05-03 – 2023-05-10 (×36): 10 mg via ORAL
  Filled 2023-05-03 (×34): qty 2

## 2023-05-03 MED ORDER — OXYCODONE HCL 5 MG PO TABS
5.0000 mg | ORAL_TABLET | ORAL | Status: DC | PRN
  Administered 2023-05-08 (×2): 5 mg via ORAL
  Filled 2023-05-03 (×6): qty 1

## 2023-05-03 MED ORDER — BISACODYL 5 MG PO TBEC: 10.0000 mg | DELAYED_RELEASE_TABLET | Freq: Every day | ORAL | Status: DC | PRN

## 2023-05-03 MED ORDER — ENOXAPARIN SODIUM 40 MG/0.4ML IJ SOSY
40.0000 mg | PREFILLED_SYRINGE | INTRAMUSCULAR | Status: DC
Start: 2023-05-03 — End: 2023-05-03

## 2023-05-03 MED ORDER — INSULIN ASPART 100 UNIT/ML IJ SOLN: 0.0000 [IU] | Freq: Three times a day (TID) | INTRAMUSCULAR | Status: DC

## 2023-05-03 MED ORDER — DOCUSATE SODIUM 100 MG PO CAPS
100.0000 mg | ORAL_CAPSULE | Freq: Two times a day (BID) | ORAL | Status: DC
Start: 2023-05-03 — End: 2023-05-03

## 2023-05-03 MED ORDER — ONDANSETRON HCL 4 MG/2ML IJ SOLN: 4.0000 mg | Freq: Four times a day (QID) | INTRAMUSCULAR | Status: DC | PRN

## 2023-05-03 MED ORDER — ALFUZOSIN HCL ER 10 MG PO TB24
10.0000 mg | ORAL_TABLET | Freq: Every day | ORAL | Status: DC
Start: 2023-05-04 — End: 2023-05-10
  Administered 2023-05-04 – 2023-05-10 (×7): 10 mg via ORAL
  Filled 2023-05-03 (×7): qty 1

## 2023-05-03 MED ORDER — GABAPENTIN 300 MG PO CAPS
300.0000 mg | ORAL_CAPSULE | Freq: Three times a day (TID) | ORAL | Status: DC
  Administered 2023-05-03 – 2023-05-10 (×20): 300 mg via ORAL
  Filled 2023-05-03 (×20): qty 1

## 2023-05-03 MED ORDER — ENOXAPARIN SODIUM 40 MG/0.4ML IJ SOSY: 40.0000 mg | PREFILLED_SYRINGE | INTRAMUSCULAR | Status: DC

## 2023-05-03 MED ORDER — METHOCARBAMOL 1000 MG/10ML IJ SOLN: 500.0000 mg | Freq: Four times a day (QID) | INTRAMUSCULAR | Status: DC | PRN

## 2023-05-03 MED ORDER — BRIMONIDINE TARTRATE 0.2 % OP SOLN
1.0000 [drp] | Freq: Two times a day (BID) | OPHTHALMIC | Status: DC
  Administered 2023-05-03 – 2023-05-10 (×14): 1 [drp] via OPHTHALMIC
  Filled 2023-05-03: qty 5

## 2023-05-03 MED ORDER — SENNOSIDES-DOCUSATE SODIUM 8.6-50 MG PO TABS: 1.0000 | ORAL_TABLET | Freq: Every evening | ORAL | Status: DC | PRN

## 2023-05-03 NOTE — Progress Notes (Signed)
 Inpatient Rehab Admissions Coordinator:    I have insurance approval and a bed available for pt to admit to CIR today. Dr. Katrinka Blazing in agreement.  Will let pt/family and TOC team know.   Estill Dooms, PT, DPT Admissions Coordinator [phone] 05/03/23  11:27 AM

## 2023-05-03 NOTE — Care Management Important Message (Signed)
 Important Message  Patient Details  Name: Caleb Wall MRN: [id number] Date of Birth: 1955-08-12   Important Message Given:  Yes - Medicare IM     Cristela Blue, CMA 05/03/2023, 10:52 AM

## 2023-05-03 NOTE — Progress Notes (Signed)
   Neurosurgery Progress Note  History: Caleb Wall is s/p C6-7 ACDF and C2-4 laminectomies, C2-T2 PSF  POD3: pt doing well this morning but did experience fevers overnight.  POD2: Continues neck soreness and some worsening arm weakness this morning POD1: NAEO. Pt reported expected pain in his neck  Physical Exam: Vitals:   05/02/23 2220 05/03/23 0331  BP:  (!) 157/77  Pulse:  84  Resp:  16  Temp: 98.8 F (37.1 C) 99.2 F (37.3 C)  SpO2:  97%    AA Ox3 CNI Strength: Side Biceps Triceps Deltoid Interossei Grip Wrist Ext. Wrist Flex.  R 4 5 4 4 5 5 5   L 5 5 4+ 4 5 5 5     Side Iliopsoas Quads Hamstring PF DF EHL  R 4 5 5 4 4 5   L 5 5 5 4 4 5    Posterior drain 75 overnight  Incisions c/d/I without signs of infection   Data:  Other tests/results: NA  Assessment/Plan:  Caleb Wall is a 68 y.o presenting with cervical stenosis and myelopathy status post C6-7 ACDF and C2-4 laminectomies, C2-T2 PSF  - mobilize - pain control - DVT prophylaxis - anterior drain removed 3/13. Continue posterior drain - episodes of fever overnight. Will order CBC and chest xray. Encourage IS - deltoid weakness improved with steroids.  - PTOT  Manning Charity PA-C Department of Neurosurgery

## 2023-05-03 NOTE — H&P (Signed)
 Physical Medicine and Rehabilitation Admission H&P       HPI: Caleb Wall is a 68 year old right-handed male with history of BPH, diabetes mellitus, hepatitis C, hypertension, hyperlipidemia, left THA 03/06/2020, quit smoking 3 years ago, ACDF 12/26/2020.  Per chart review patient lives with spouse.  1 level home 4 steps to entry.  Independent with occasional cane prior to admission.  Presented to [facility] 04/30/2023 with progressive neck pain, weakness/numbness tingling and loss of hand function especially over the past few months to 1 year as well as weakness right lower extremity and right upper extremity.  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  Underwent anterior cervical discectomy and fusion C6-7 placement of interbody device with posterior segmental instrumentation C2 to-T2 posterior cervical thoracic arthrodesis C2 to-T2/cervical laminectomy C2-C4 04/30/2023 per Dr. Ernestine Mcmurray.  Anterior drain removed 3/13 and await plan for removal of posterior drain.  Patient did complete Decadron taper.  Placed on Lovenox for DVT prophylaxis 05/01/2023.  Therapy evaluations completed due to patient decreased functional mobility was admitted for a comprehensive rehab program.   Review of Systems  Constitutional:  Negative for chills and fever.  HENT:  Negative for hearing loss.   Eyes:  Negative for blurred vision and double vision.  Respiratory:  Negative for cough, shortness of breath and wheezing.   Cardiovascular:  Negative for chest pain, palpitations and leg swelling.  Gastrointestinal:  Positive for constipation. Negative for heartburn, nausea and vomiting.  Genitourinary:  Positive for frequency and urgency. Negative for dysuria and flank pain.  Musculoskeletal:  Positive for falls, myalgias and neck pain.  Skin:  Negative for rash.  Neurological:  Positive for sensory change and weakness.  All other systems reviewed and are  negative.       Past Medical History:  Diagnosis Date   Allergy      Seasonal   Anemia     BPH (benign prostatic hyperplasia)     Cervical myelopathy (HCC)     Chest wall mass 09/2022   Diabetes mellitus without complication (HCC)     Hepatitis C     History of kidney stones     Hyperlipidemia     Hypertension     Myelopathy due to cervical spondylosis     Other secondary kyphosis, cervical region     Prediabetes     Primary open angle glaucoma (POAG) of left eye     Pseudoarthrosis of cervical spine (HCC)     Sepsis secondary to UTI (HCC)     Spondylosis of cervical spine               Past Surgical History:  Procedure Laterality Date   ANTERIOR CERVICAL DECOMPRESSION/DISCECTOMY FUSION 4 LEVELS N/A 12/26/2020    Procedure: C3-6 ANTERIOR CERVICAL DECOMPRESSION/DISCECTOMY FUSION 3 LEVELS;  Surgeon: Lucy Chris, MD;  Location: [facility] ORS;  Service: Neurosurgery;  Laterality: N/A;   BROW LIFT AND BLEPHAROPLASTY Bilateral 12/21/2019   CATARACT EXTRACTION W/ INTRAOCULAR LENS IMPLANT Left 02/05/2019   COLONOSCOPY WITH PROPOFOL N/A 09/29/2021    Procedure: COLONOSCOPY WITH PROPOFOL;  Surgeon: Regis Bill, MD;  Location: [facility] ENDOSCOPY;  Service: Endoscopy;  Laterality: N/A;   KNEE SURGERY Left 2002    Meniscus tear   LITHOTRIPSY   12/13/2020   MASS EXCISION N/A 10/11/2022    Procedure: EXCISION MASS CHEST WALL;  Surgeon: Sung Amabile, DO;  Location: [facility] ORS;  Service: General;  Laterality: N/A;  ORIF FEMORAL NECK FRACTURE W/ DHS Left 03/06/2020   SHOULDER ARTHROSCOPY WITH OPEN ROTATOR CUFF REPAIR AND DISTAL CLAVICLE ACROMINECTOMY Left 07/25/2021    Procedure: SHOULDER ARTHROSCOPY WITH OPEN ROTATOR CUFF REPAIR AND DISTAL CLAVICLE ACROMINECTOMY;  Surgeon: Juanell Fairly, MD;  Location: [facility] ORS;  Service: Orthopedics;  Laterality: Left;   TOTAL HIP ARTHROPLASTY Left 03/06/2020             Family History  Problem Relation Age of Onset   Heart disease Mother      Hypertension Mother     Diabetes Maternal Aunt     Cancer Maternal Uncle          Prostate   Diabetes Paternal Aunt          Social History:  reports that he quit smoking about 3 years ago. His smoking use included cigarettes. He started smoking about 43 years ago. He has a 20 pack-year smoking history. He has never used smokeless tobacco. He reports that he does not currently use drugs after having used the following drugs: Marijuana. He reports that he does not drink alcohol. Allergies:  Allergies  No Known Allergies         Medications Prior to Admission  Medication Sig Dispense Refill   alfuzosin (UROXATRAL) 10 MG 24 hr tablet Take 1 tablet (10 mg total) by mouth 3 (three) times a week. (Patient taking differently: Take 10 mg by mouth daily.) 13 tablet 11   Aspirin-Salicylamide-Caffeine (BC HEADACHE POWDER PO) Take 1 packet by mouth daily as needed (pain).       bisacodyl (DULCOLAX) 5 MG EC tablet Take 10 mg by mouth daily as needed for moderate constipation.       brimonidine (ALPHAGAN) 0.2 % ophthalmic solution Place 1 drop into both eyes 2 (two) times daily.       dorzolamide-timolol (COSOPT) 22.3-6.8 MG/ML ophthalmic solution Place 1 drop into both eyes 2 (two) times daily.       latanoprost (XALATAN) 0.005 % ophthalmic solution Place 1 drop into both eyes at bedtime.       meloxicam (MOBIC) 15 MG tablet Take 15 mg by mouth daily.       MOUNJARO 10 MG/0.5ML Pen Inject 10 mg into the skin once a week.       nitrofurantoin, macrocrystal-monohydrate, (MACROBID) 100 MG capsule Take 100 mg by mouth daily.       tadalafil (CIALIS) 5 MG tablet Take 0.5 tablets (2.5 mg total) by mouth 3 (three) times a week. (Patient taking differently: Take 5 mg by mouth as directed. Patient takes at bedtime Sunday, Wednesday, Friday) 7 tablet 11   traMADol (ULTRAM) 50 MG tablet Take 1 tablet (50 mg total) by mouth every 6 (six) hours as needed. 6 tablet 0   acetaminophen (TYLENOL) 500 MG tablet Take  1,000 mg by mouth every 6 (six) hours as needed.                  Home: Home Living Family/patient expects to be discharged to:: Private residence Living Arrangements: Spouse/significant other Available Help at Discharge: Family Type of Home: House Home Access: Stairs to enter Secretary/administrator of Steps: 4 Entrance Stairs-Rails: Can reach both Home Layout: One level Bathroom Shower/Tub: Tub/shower unit Home Equipment: Grab bars - toilet, Grab bars - tub/shower, Hand held shower head, Shower seat, Cane - single point Additional Comments: a/o x4   Functional History: Prior Function Prior Level of Function : Independent/Modified Independent, History of Falls (last six months),  Driving Mobility Comments: Cane PRN when legs feel weak ADLs Comments: Indep   Functional Status:  Mobility: Bed Mobility Overal bed mobility: Needs Assistance Bed Mobility: Supine to Sit Rolling: Min assist Sidelying to sit: Used rails, Min assist Supine to sit: Mod assist Sit to supine: Max assist Sit to sidelying: Min assist General bed mobility comments: Pt more alert and able to follow cuing today, heavy use of rail and extra time to get up to sitting EOB Transfers Overall transfer level: Needs assistance Equipment used: Rolling walker (2 wheels) Transfers: Sit to/from Stand Sit to Stand: Min assist, From elevated surface General transfer comment: unable to rise to standing from standard height bed  further cuing for set up and sequencing and ~2" elevation with minA to rise Ambulation/Gait Ambulation/Gait assistance: Mod assist, Min assist Gait Distance (Feet): 30 Feet Assistive device: Rolling walker (2 wheels) General Gait Details: Pt c/o neck pain and generally feeling weak, but was able to ambulate ~30 ft with slow, guarded, walker-reliant gait.  He displayed small scale knee buckling that he did self arrrest, but ultimately he fatigued quickly and needed to sit relatively abruptly    ADL: ADL Overall ADL's : Needs assistance/impaired Eating/Feeding: Minimal assistance, Sitting Grooming: Wash/dry face, Maximal assistance (Wife completed while in bed) Lower Body Dressing: Maximal assistance, Sit to/from stand Functional mobility during ADLs: Rolling walker (2 wheels), +2 for physical assistance, Minimal assistance (RW; Min A for STS, MAXA to remain standing due to onset nausa, and increased level of pain when standing.) General ADL Comments: MIN A + RW for ADL t/f ~20 ft, poor safety awareness and terminated further mobility 2/2 multiple LOBs requiring MOD A. MIN A urinal use in sitting   Cognition: Cognition Orientation Level: Oriented X4 Cognition Arousal: Alert Behavior During Therapy: WFL for tasks assessed/performed, Impulsive   Physical Exam: Blood pressure (!) 167/80, pulse 78, temperature 98.2 F (36.8 C), resp. rate 15, height 5\' 7"  (1.702 m), weight 100.7 kg, SpO2 99%. Physical Exam HENT:     Head: Normocephalic.     Right Ear: External ear normal.     Left Ear: External ear normal.     Mouth/Throat:     Mouth: Mucous membranes are moist.  Eyes:     Extraocular Movements: Extraocular movements intact.     Pupils: Pupils are equal, round, and reactive to light.  Cardiovascular:     Rate and Rhythm: Normal rate.     Heart sounds: No murmur heard.    No gallop.  Pulmonary:     Effort: Pulmonary effort is normal. No respiratory distress.     Breath sounds: No wheezing.  Abdominal:     General: Bowel sounds are normal. There is no distension.     Tenderness: There is no abdominal tenderness.  Musculoskeletal:        General: Normal range of motion.     Cervical back: Normal range of motion.  Skin:    General: Skin is warm and dry. Cervical incision CDI with honeycomb dressing. Hemovac drain in place with 3-5 cc of serosanguinous drainage.  Neurological:     Mental Status: He is alert.     Comments: Alert and oriented x 3. Normal insight and  awareness. Intact Memory. Normal language and speech. Cranial nerve exam unremarkable. MMT:  UE grossly 4/5 prox to distal, perhaps a bit of HI weakness. BLE 4/5 prox to distal. Decreased LT in finger tips of both hands. DTR's 1+. No abnl resting tone. Psychiatric:  Mood and Affect: Mood normal.        Behavior: Behavior normal.        Lab Results Last 48 Hours        Results for orders placed or performed during the hospital encounter of 04/30/23 (from the past 48 hours)  CBC     Status: Abnormal    Collection Time: 05/03/23  8:35 AM  Result Value Ref Range    WBC 11.4 (H) 4.0 - 10.5 K/uL    RBC 3.18 (L) 4.22 - 5.81 MIL/uL    Hemoglobin 9.3 (L) 13.0 - 17.0 g/dL    HCT 14.7 (L) 82.9 - 52.0 %    MCV 85.5 80.0 - 100.0 fL    MCH 29.2 26.0 - 34.0 pg    MCHC 34.2 30.0 - 36.0 g/dL    RDW 56.2 13.0 - 86.5 %    Platelets 171 150 - 400 K/uL    nRBC 0.0 0.0 - 0.2 %      Comment: Performed at [facility], [address], [place], [place] [address]      Imaging Results (Last 48 hours)  No results found.         Blood pressure (!) 167/80, pulse 78, temperature 98.2 F (36.8 C), resp. rate 15, height 5\' 7"  (1.702 m), weight 100.7 kg, SpO2 99%.   Medical Problem List and Plan: 1. Functional deficits secondary to cervical stenosis/myelopathy status post C6-7 ACDF and C2-4 laminectomies, C2-T2 PSF -has history of C3-C6 ACDF 12/26/2020. -NO BRACE REQUIRED.Marland Kitchen   -patient may not shower             -ELOS/Goals: 14-16 days, supervision to min assist goals with PT, OT   2.  Antithrombotics: -DVT/anticoagulation:  Pharmaceutical: Lovenox             -antiplatelet therapy: N/A 3. Pain Management: Neurontin 300 mg 3 times daily, Robaxin and oxycodone as needed 4. Mood/Behavior/Sleep: Provide emotional support             -antipsychotic agents: N/A 5. Neuropsych/cognition: This patient is capable of making decisions on his own behalf. 6. Skin/Wound Care: Routine skin checks              -continue posterior surgical site drain until output is less than 100 cc over 24 hours or less than 50 cc over 12 hours.   -drain became disconnected during transfer from [facility]. Tubing was reconnected and I recharged the hemovac container. Observe tonight for functionality 7. Fluids/Electrolytes/Nutrition: Routine I&O's with follow-up chemistries 8.  BPH.  Uroxatrol 10 mg daily, Cialis 2.5 mg daily, continue prophylactic Macrobid 100 mg daily 9.  Constipation.  Senokot 1 tablet twice daily, Colace 100 mg twice daily ---> change to senokot-s 2 qhs             -LBM 3/11  -pt says that he feels like he needs to empty. Will have dulcolax suppository available as needed. Sorbitol prn also.  10.  Diabetes mellitus.  Latest hemoglobin A1c 6.1.  Patient on Mounjaro 10 mg weekly prior to admission.  -pt states that family will bring it in. 11.  Primary open-angle glaucoma left eye.  Continue eyedrops     Charlton Amor, PA-C 05/03/2023  I have personally performed a face to face diagnostic evaluation of this patient and formulated the key components of the plan.  Additionally, I have personally reviewed laboratory data, imaging studies, as well as relevant notes and concur with the physician assistant's documentation above.  The patient's status has not changed from the original H&P.  Any changes in documentation from the acute care chart have been noted above.  [name], MD, [place]

## 2023-05-03 NOTE — H&P (Addendum)
 Physical Medicine and Rehabilitation Admission H&P     HPI: Caleb Wall is a 68 year old right-handed male with history of BPH, diabetes mellitus, hepatitis C, hypertension, hyperlipidemia, left THA 03/06/2020, quit smoking 3 years ago, ACDF 12/26/2020.  Per chart review patient lives with spouse.  1 level home 4 steps to entry.  Independent with occasional cane prior to admission.  Presented to [facility] 04/30/2023 with progressive neck pain, weakness/numbness tingling and loss of hand function especially over the past few months to 1 year as well as weakness right lower extremity and right upper extremity.  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  Underwent anterior cervical discectomy and fusion C6-7 placement of interbody device with posterior segmental instrumentation C2 to-T2 posterior cervical thoracic arthrodesis C2 to-T2/cervical laminectomy C2-C4 04/30/2023 per Dr. Ernestine Mcmurray.  Anterior drain removed 3/13 and await plan for removal of posterior drain.  Patient did complete Decadron taper.  Placed on Lovenox for DVT prophylaxis 05/01/2023.  Therapy evaluations completed due to patient decreased functional mobility was admitted for a comprehensive rehab program.  Review of Systems  Constitutional:  Negative for chills and fever.  HENT:  Negative for hearing loss.   Eyes:  Negative for blurred vision and double vision.  Respiratory:  Negative for cough, shortness of breath and wheezing.   Cardiovascular:  Negative for chest pain, palpitations and leg swelling.  Gastrointestinal:  Positive for constipation. Negative for heartburn, nausea and vomiting.  Genitourinary:  Positive for frequency and urgency. Negative for dysuria and flank pain.  Musculoskeletal:  Positive for falls, myalgias and neck pain.  Skin:  Negative for rash.  Neurological:  Positive for sensory change and weakness.  All other systems reviewed and  are negative.  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    Other secondary kyphosis, cervical region    Prediabetes    Primary open angle glaucoma (POAG) of left eye    Pseudoarthrosis of cervical spine (HCC)    Sepsis secondary to UTI (HCC)    Spondylosis of cervical spine    Past Surgical History:  Procedure Laterality Date   ANTERIOR CERVICAL DECOMPRESSION/DISCECTOMY FUSION 4 LEVELS N/A 12/26/2020   Procedure: C3-6 ANTERIOR CERVICAL DECOMPRESSION/DISCECTOMY FUSION 3 LEVELS;  Surgeon: Lucy Chris, MD;  Location: [facility] ORS;  Service: Neurosurgery;  Laterality: N/A;   BROW LIFT AND BLEPHAROPLASTY Bilateral 12/21/2019   CATARACT EXTRACTION W/ INTRAOCULAR LENS IMPLANT Left 02/05/2019   COLONOSCOPY WITH PROPOFOL N/A 09/29/2021   Procedure: COLONOSCOPY WITH PROPOFOL;  Surgeon: Regis Bill, MD;  Location: [facility];  Service: Endoscopy;  Laterality: N/A;   KNEE SURGERY Left 2002   Meniscus tear   LITHOTRIPSY  12/13/2020   MASS EXCISION N/A 10/11/2022   Procedure: EXCISION MASS CHEST WALL;  Surgeon: Sung Amabile, DO;  Location: [facility] ORS;  Service: General;  Laterality: N/A;   ORIF FEMORAL NECK FRACTURE W/ DHS Left 03/06/2020   SHOULDER ARTHROSCOPY WITH OPEN ROTATOR CUFF REPAIR AND DISTAL CLAVICLE ACROMINECTOMY Left 07/25/2021   Procedure: SHOULDER ARTHROSCOPY WITH OPEN ROTATOR CUFF REPAIR AND DISTAL CLAVICLE ACROMINECTOMY;  Surgeon: Juanell Fairly, MD;  Location: [facility];  Service: Orthopedics;  Laterality: Left;   TOTAL HIP ARTHROPLASTY Left 03/06/2020   Family History  Problem Relation Age of Onset   Heart disease Mother    Hypertension Mother    Diabetes Maternal Aunt     Cancer Maternal Uncle        Prostate   Diabetes Paternal Aunt    Social History:  reports that he quit smoking about 3 years ago. His smoking use included cigarettes. He started smoking about 43 years ago. He has a 20 pack-year smoking history. He has never used smokeless tobacco. He reports that he does not currently use drugs after having used the following drugs: Marijuana. He reports that he does not drink alcohol. Allergies: No Known Allergies Medications Prior to Admission  Medication Sig Dispense Refill   alfuzosin (UROXATRAL) 10 MG 24 hr tablet Take 1 tablet (10 mg total) by mouth 3 (three) times a week. (Patient taking differently: Take 10 mg by mouth daily.) 13 tablet 11   Aspirin-Salicylamide-Caffeine (BC HEADACHE POWDER PO) Take 1 packet by mouth daily as needed (pain).     bisacodyl (DULCOLAX) 5 MG EC tablet Take 10 mg by mouth daily as needed for moderate constipation.     brimonidine (ALPHAGAN) 0.2 % ophthalmic solution Place 1 drop into both eyes 2 (two) times daily.     dorzolamide-timolol (COSOPT) 22.3-6.8 MG/ML ophthalmic solution Place 1 drop into both eyes 2 (two) times daily.     latanoprost (XALATAN) 0.005 % ophthalmic solution Place 1 drop into both eyes at bedtime.     meloxicam (MOBIC) 15 MG tablet Take 15 mg by mouth daily.     MOUNJARO 10 MG/0.5ML Pen Inject 10 mg into the skin once a week.     nitrofurantoin, macrocrystal-monohydrate, (MACROBID) 100 MG capsule Take 100 mg by mouth daily.     tadalafil (CIALIS) 5 MG tablet Take 0.5 tablets (2.5 mg total) by mouth 3 (three) times a week. (Patient taking differently: Take 5 mg by mouth as directed. Patient takes at bedtime Sunday, Wednesday, Friday) 7 tablet 11   traMADol (ULTRAM) 50 MG tablet Take 1 tablet (50 mg total) by mouth every 6 (six) hours as needed. 6 tablet 0   acetaminophen (TYLENOL) 500 MG tablet Take 1,000 mg by mouth every 6 (six) hours as needed.        Home: Home Living Family/patient expects to  be discharged to:: Private residence Living Arrangements: Spouse/significant other Available Help at Discharge: Family Type of Home: House Home Access: Stairs to enter Secretary/administrator of Steps: 4 Entrance Stairs-Rails: Can reach both Home Layout: One level Bathroom Shower/Tub: Tub/shower unit Home Equipment: Grab bars - toilet, Grab bars - tub/shower, Hand held shower head, Shower seat, Cane - single point Additional Comments: a/o x4   Functional History: Prior Function Prior Level of Function : Independent/Modified Independent, History of Falls (last six months), Driving Mobility Comments: Cane PRN when legs feel weak ADLs Comments: Indep  Functional Status:  Mobility: Bed Mobility Overal bed mobility: Needs Assistance Bed Mobility: Supine to Sit Rolling: Min assist Sidelying to sit: Used rails, Min assist Supine to sit: Mod assist Sit to supine: Max assist Sit to sidelying: Min assist General bed mobility comments: Pt more alert and able to follow cuing today, heavy use of rail and extra time to get up to sitting EOB Transfers Overall transfer level: Needs assistance Equipment used: Rolling walker (2 wheels) Transfers: Sit to/from Stand Sit  to Stand: Min assist, From elevated surface General transfer comment: unable to rise to standing from standard height bed  further cuing for set up and sequencing and ~2" elevation with minA to rise Ambulation/Gait Ambulation/Gait assistance: Mod assist, Min assist Gait Distance (Feet): 30 Feet Assistive device: Rolling walker (2 wheels) General Gait Details: Pt c/o neck pain and generally feeling weak, but was able to ambulate ~30 ft with slow, guarded, walker-reliant gait.  He displayed small scale knee buckling that he did self arrrest, but ultimately he fatigued quickly and needed to sit relatively abruptly    ADL: ADL Overall ADL's : Needs assistance/impaired Eating/Feeding: Minimal assistance, Sitting Grooming: Wash/dry  face, Maximal assistance (Wife completed while in bed) Lower Body Dressing: Maximal assistance, Sit to/from stand Functional mobility during ADLs: Rolling walker (2 wheels), +2 for physical assistance, Minimal assistance (RW; Min A for STS, MAXA to remain standing due to onset nausa, and increased level of pain when standing.) General ADL Comments: MIN A + RW for ADL t/f ~20 ft, poor safety awareness and terminated further mobility 2/2 multiple LOBs requiring MOD A. MIN A urinal use in sitting  Cognition: Cognition Orientation Level: Oriented X4 Cognition Arousal: Alert Behavior During Therapy: WFL for tasks assessed/performed, Impulsive  Physical Exam: Blood pressure (!) 167/80, pulse 78, temperature 98.2 F (36.8 C), resp. rate 15, height 5\' 7"  (1.702 m), weight 100.7 kg, SpO2 99%. Physical Exam HENT:     Head: Normocephalic.     Right Ear: External ear normal.     Left Ear: External ear normal.     Mouth/Throat:     Mouth: Mucous membranes are moist.  Eyes:     Extraocular Movements: Extraocular movements intact.     Pupils: Pupils are equal, round, and reactive to light.  Cardiovascular:     Rate and Rhythm: Normal rate.     Heart sounds: No murmur heard.    No gallop.  Pulmonary:     Effort: Pulmonary effort is normal. No respiratory distress.     Breath sounds: No wheezing.  Abdominal:     General: Bowel sounds are normal. There is no distension.     Tenderness: There is no abdominal tenderness.  Musculoskeletal:        General: Normal range of motion.     Cervical back: Normal range of motion.  Skin:    General: Skin is warm and dry.  Neurological:     Mental Status: He is alert.     Comments: Alert and oriented x 3. Normal insight and awareness. Intact Memory. Normal language and speech. Cranial nerve exam unremarkable. MMT:   Psychiatric:        Mood and Affect: Mood normal.        Behavior: Behavior normal.     Results for orders placed or performed during  the hospital encounter of 04/30/23 (from the past 48 hours)  CBC     Status: Abnormal   Collection Time: 05/03/23  8:35 AM  Result Value Ref Range   WBC 11.4 (H) 4.0 - 10.5 K/uL   RBC 3.18 (L) 4.22 - 5.81 MIL/uL   Hemoglobin 9.3 (L) 13.0 - 17.0 g/dL   HCT 16.1 (L) 09.6 - 04.5 %   MCV 85.5 80.0 - 100.0 fL   MCH 29.2 26.0 - 34.0 pg   MCHC 34.2 30.0 - 36.0 g/dL   RDW 40.9 81.1 - 91.4 %   Platelets 171 150 - 400 K/uL   nRBC 0.0 0.0 - 0.2 %  Comment: Performed at [facility], [address] [place], [place] [address]   No results found.    Blood pressure (!) 167/80, pulse 78, temperature 98.2 F (36.8 C), resp. rate 15, height 5\' 7"  (1.702 m), weight 100.7 kg, SpO2 99%.  Medical Problem List and Plan: 1. Functional deficits secondary to cervical stenosis/myelopathy status post C6-7 ACDF and C2-4 laminectomies, C2-T2 PSF -has history of C3-C6 ACDF 12/26/2020. -NO BRACE REQUIRED.Marland Kitchen   -patient may not shower  -ELOS/Goals: 14-16 days, supervision to min assist goals with PT, OT   2.  Antithrombotics: -DVT/anticoagulation:  Pharmaceutical: Lovenox  -antiplatelet therapy: N/A 3. Pain Management: Neurontin 300 mg 3 times daily, Robaxin and oxycodone as needed 4. Mood/Behavior/Sleep: Provide emotional support  -antipsychotic agents: N/A 5. Neuropsych/cognition: This patient is capable of making decisions on his own behalf. 6. Skin/Wound Care: Routine skin checks  -continue posterior surgical site drain until output is less than 100 cc over 24 hours or less than 50 cc over 12 hours. . 7. Fluids/Electrolytes/Nutrition: Routine I&O's with follow-up chemistries 8.  BPH.  Uroxatrol 10 mg daily, Cialis 2.5 mg daily, continue prophylactic Macrobid 100 mg daily 9.  Constipation.  Senokot 1 tablet twice daily, Colace 100 mg twice daily, Dulcolax tablet as needed  -LBM 3/11 10.  Diabetes mellitus.  Latest hemoglobin A1c 6.1.  Patient on Mounjaro 10 mg weekly prior to admission and will  have family bring it in. 11.  Primary open-angle glaucoma left eye.  Continue eyedrops   Charlton Amor, PA-C 05/03/2023

## 2023-05-03 NOTE — Plan of Care (Signed)
  Problem: Education: Goal: Knowledge of General Education information will improve Description: Including pain rating scale, medication(s)/side effects and non-pharmacologic comfort measures Outcome: Adequate for Discharge   Problem: Health Behavior/Discharge Planning: Goal: Ability to manage health-related needs will improve Outcome: Adequate for Discharge   Problem: Clinical Measurements: Goal: Ability to maintain clinical measurements within normal limits will improve Outcome: Adequate for Discharge Goal: Will remain free from infection Outcome: Adequate for Discharge Goal: Diagnostic test results will improve Outcome: Adequate for Discharge Goal: Respiratory complications will improve Outcome: Adequate for Discharge Goal: Cardiovascular complication will be avoided Outcome: Adequate for Discharge   Problem: Activity: Goal: Risk for activity intolerance will decrease Outcome: Adequate for Discharge   Problem: Nutrition: Goal: Adequate nutrition will be maintained Outcome: Adequate for Discharge   Problem: Coping: Goal: Level of anxiety will decrease Outcome: Adequate for Discharge   Problem: Elimination: Goal: Will not experience complications related to bowel motility Outcome: Adequate for Discharge Goal: Will not experience complications related to urinary retention Outcome: Adequate for Discharge   Problem: Pain Managment: Goal: General experience of comfort will improve and/or be controlled Outcome: Adequate for Discharge   Problem: Safety: Goal: Ability to remain free from injury will improve Outcome: Adequate for Discharge   Problem: Skin Integrity: Goal: Risk for impaired skin integrity will decrease Outcome: Adequate for Discharge   Problem: Education: Goal: Ability to verbalize activity precautions or restrictions will improve Outcome: Adequate for Discharge Goal: Knowledge of the prescribed therapeutic regimen will improve Outcome: Adequate for  Discharge Goal: Understanding of discharge needs will improve Outcome: Adequate for Discharge   Problem: Activity: Goal: Ability to avoid complications of mobility impairment will improve Outcome: Adequate for Discharge Goal: Ability to tolerate increased activity will improve Outcome: Adequate for Discharge Goal: Will remain free from falls Outcome: Adequate for Discharge   Problem: Bowel/Gastric: Goal: Gastrointestinal status for postoperative course will improve Outcome: Adequate for Discharge   Problem: Clinical Measurements: Goal: Ability to maintain clinical measurements within normal limits will improve Outcome: Adequate for Discharge Goal: Postoperative complications will be avoided or minimized Outcome: Adequate for Discharge Goal: Diagnostic test results will improve Outcome: Adequate for Discharge   Problem: Pain Management: Goal: Pain level will decrease Outcome: Adequate for Discharge   Problem: Skin Integrity: Goal: Will show signs of wound healing Outcome: Adequate for Discharge   Problem: Health Behavior/Discharge Planning: Goal: Identification of resources available to assist in meeting health care needs will improve Outcome: Adequate for Discharge   Problem: Bladder/Genitourinary: Goal: Urinary functional status for postoperative course will improve Outcome: Adequate for Discharge

## 2023-05-03 NOTE — Progress Notes (Signed)
 [name], MD  Physician Physical Medicine and Rehabilitation   PMR Pre-admission    Signed   Date of Service: 05/02/2023  8:42 AM  Related encounter: Admission (Current) from 04/30/2023 in [facility] (1A)   Signed     Expand All Collapse All  PMR Admission Coordinator Pre-Admission Assessment   Patient: Caleb Wall is an 68 y.o., male MRN: [id number] DOB: 08-06-1955 Height: 5\' 7"  (170.2 cm) Weight: 100.7 kg                                                                                                                                                  Insurance Information HMO: yes    PPO:      PCP:      IPA:      80/20:      OTHER:  PRIMARY: [employer]      Policy#: [id number]      Subscriber: pt CM Name: faxed approval      Phone#: [phone]     Fax#: [phone] Pre-Cert#: [id number] auth for CIR via faxed approval with updates due to fax listed above on 3/18 (5 days from admit)      Employer:  Benefits:  Phone #: [phone]     Name:  Eff. Date: 02/20/23     Deduct: $0      Out of Pocket Max: $4000 (met $211.85)      Life Max:   CIR: $275/day for 5 days      SNF: $20/day for 20 days, then $214/day for 40 days Outpatient:      Co-Pay: $20/visit Home Health: 100%      Co-Pay:  DME: 80%     Co-Pay: 20% Providers:  SECONDARY:       Policy#:       Phone#:    Artist:       Phone#:    The Engineer, materials Information Summary" for patients in Inpatient Rehabilitation Facilities with attached "Privacy Act Statement-Health Care Records" was provided and verbally reviewed with: Patient and Family   Emergency Contact Information Contact Information       Name Relation Home Work Mobile    [place] Spouse [phone]   [phone]         Other Contacts   None on File      Current Medical History  Patient Admitting Diagnosis: cervical myelopathy   History of Present Illness: Pt is a 68 y/o male with  PMH of cervical myelopathy, HTN, BPH admitted to [facility] on 04/30/23 for anterior cervical discectomy and fusion of C2-T2 and posterior spinal fusion of C2-4 for management of cervical myelopathy.  Hospital course pain management, bowel management, diabetes and HTN management.  Therapy evaluations completed and pt was recommended for CIR.  Glasgow Coma Scale Score: 15   Patient's medical  record from [facility] has been reviewed by the rehabilitation admission coordinator and physician.   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 Yes   Family History  family history includes Cancer in his maternal uncle; Diabetes in his maternal aunt and paternal aunt; Heart disease in his mother; Hypertension in his mother.     Current Medications   Current Medications    Current Facility-Administered Medications:    acetaminophen (TYLENOL) tablet 650 mg, 650 mg, Oral, Q4H PRN, 650 mg at 05/02/23 0320 **OR** acetaminophen (TYLENOL) suppository 650 mg, 650 mg, Rectal, Q4H PRN, Frisbie, Brooke, PA-C   alfuzosin (UROXATRAL) 24 hr tablet 10 mg, 10 mg, Oral, Daily, Frisbie, Brooke, PA-C, 10 mg at 05/01/23 1144   alum & mag hydroxide-simeth (MAALOX/MYLANTA) 200-200-20 MG/5ML suspension 30 mL, 30 mL, Oral, Q6H PRN, Frisbie, Brooke, PA-C   bisacodyl (DULCOLAX) EC tablet 10 mg, 10 mg, Oral,  Daily PRN, Frisbie, Brooke, PA-C   brimonidine (ALPHAGAN) 0.2 % ophthalmic solution 1 drop, 1 drop, Both Eyes, BID, Frisbie, Brooke, PA-C, 1 drop at 05/01/23 2128   docusate sodium (COLACE) capsule 100 mg, 100 mg, Oral, BID, Frisbie, Brooke, PA-C, 100 mg at 05/01/23 2128   dorzolamide-timolol (COSOPT) 2-0.5 % ophthalmic solution 1 drop, 1 drop, Both Eyes, BID, Frisbie, Brooke, PA-C, 1 drop at 05/01/23 2128   enoxaparin (LOVENOX) injection 40 mg, 40 mg, Subcutaneous, Q24H, Frisbie, Brooke, PA-C, 40 mg at 05/01/23 9147   gabapentin (NEURONTIN) capsule 300 mg, 300 mg, Oral, TID, Frisbie, Brooke, PA-C, 300 mg at 05/01/23 2128   HYDROmorphone (DILAUDID) injection 0.5 mg, 0.5 mg, Intravenous, Q3H PRN, Frisbie, Brooke, PA-C, 0.5 mg at 05/01/23 0157   latanoprost (XALATAN) 0.005 % ophthalmic solution 1 drop, 1 drop, Both Eyes, QHS, Frisbie, Brooke, PA-C, 1 drop at 05/01/23 2129   magnesium citrate solution 1 Bottle, 1 Bottle, Oral, Once PRN, Frisbie, Brooke, PA-C   menthol-cetylpyridinium (CEPACOL) lozenge 3 mg, 1 lozenge, Oral, PRN **OR** phenol (CHLORASEPTIC) mouth spray 1 spray, 1 spray, Mouth/Throat, PRN, Frisbie, Brooke, PA-C   methocarbamol (ROBAXIN) tablet 500 mg, 500 mg, Oral, Q6H PRN **OR** methocarbamol (ROBAXIN) injection 500 mg, 500 mg, Intravenous, Q6H PRN, Frisbie, Brooke, PA-C, 500 mg at 04/30/23 2000   nitrofurantoin (macrocrystal-monohydrate) (MACROBID) capsule 100 mg, 100 mg, Oral, Daily, Frisbie, Brooke, PA-C, 100 mg at 05/01/23 1144   ondansetron (ZOFRAN) tablet 4 mg, 4 mg, Oral, Q6H PRN **OR** ondansetron (ZOFRAN) injection 4 mg, 4 mg, Intravenous, Q6H PRN, Frisbie, Brooke, PA-C, 4 mg at 05/01/23 8295   Oral care mouth rinse, 15 mL, Mouth Rinse, PRN, Lovenia Kim, MD  oxyCODONE (Oxy IR/ROXICODONE) immediate release tablet 10 mg, 10 mg, Oral, Q3H PRN, Frisbie, Brooke, PA-C, 10 mg at 05/02/23 1610   oxyCODONE (Oxy IR/ROXICODONE) immediate release tablet 5 mg, 5 mg, Oral, Q3H PRN,  Frisbie, Brooke, PA-C   pantoprazole (PROTONIX) injection 40 mg, 40 mg, Intravenous, QHS, Frisbie, Brooke, PA-C, 40 mg at 05/01/23 2128   senna (SENOKOT) tablet 8.6 mg, 1 tablet, Oral, BID, Frisbie, Brooke, PA-C, 8.6 mg at 05/01/23 2128   senna-docusate (Senokot-S) tablet 1 tablet, 1 tablet, Oral, QHS PRN, Frisbie, Brooke, PA-C   sodium chloride flush (NS) 0.9 % injection 3 mL, 3 mL, Intravenous, Q12H, Frisbie, Brooke, PA-C, 3 mL at 05/01/23 2129   sodium chloride flush (NS) 0.9 % injection 3 mL, 3 mL, Intravenous, PRN, Frisbie, Brooke, PA-C   tadalafil (CIALIS) tablet 2.5 mg, 2.5 mg, Oral, Daily, Frisbie, Brooke, PA-C, 2.5 mg at 05/01/23 0000     Patients Current Diet:  Diet Order                  Diet Heart Room service appropriate? Yes; Fluid consistency: Thin  Diet effective now                         Precautions / Restrictions Precautions Precautions: Cervical Restrictions Weight Bearing Restrictions Per Provider Order: No    Has the patient had 2 or more falls or a fall with injury in the past year?Yes   Prior Activity Level Limited Community (1-2x/wk): independent with occasional cane in community, driving   Prior Functional Level Prior Function Prior Level of Function : Independent/Modified Independent, History of Falls (last six months), Driving Mobility Comments: Cane PRN when legs feel weak ADLs Comments: Indep   Self Care: Did the patient need help bathing, dressing, using the toilet or eating?  Independent   Indoor Mobility: Did the patient need assistance with walking from room to room (with or without device)? Independent   Stairs: Did the patient need assistance with internal or external stairs (with or without device)? Independent   Functional Cognition: Did the patient need help planning regular tasks such as shopping or remembering to take medications? Independent   Patient Information Are you of Hispanic, Latino/a,or Spanish origin?: A. No, not of  Hispanic, Latino/a, or Spanish origin What is your race?: B. Black or African American Do you need or want an interpreter to communicate with a doctor or health care staff?: 0. No   Patient's Response To:  Health Literacy and Transportation Is the patient able to respond to health literacy and transportation needs?: Yes Health Literacy - How often do you need to have someone help you when you read instructions, pamphlets, or other written material from your doctor or pharmacy?: Never In the past 12 months, has lack of transportation kept you from medical appointments or from getting medications?: No In the past 12 months, has lack of transportation kept you from meetings, work, or from getting things needed for daily living?: No   Home Assistive Devices / Equipment Home Equipment: Grab bars - toilet, Grab bars - tub/shower, Hand held shower head, Shower seat, Cane - single point   Prior Device Use: Indicate devices/aids used by the patient prior to current illness, exacerbation or injury?  cane   Current Functional Level Cognition   Orientation Level: Oriented X4    Extremity Assessment (includes Sensation/Coordination)   Upper Extremity Assessment: Generalized weakness (expected post surgical hesitancy, able to functionally use for mobility, mentation  limited formal testing)  Lower Extremity Assessment: Generalized weakness, Overall WFL for tasks assessed (functional and seemingly near symmetrical LE strength)     ADLs   Overall ADL's : Needs assistance/impaired Eating/Feeding: Minimal assistance, Sitting Grooming: Wash/dry face, Maximal assistance (Wife completed while in bed) Lower Body Dressing: Maximal assistance, Sit to/from stand Functional mobility during ADLs: Rolling walker (2 wheels), +2 for physical assistance, Minimal assistance (RW; Min A for STS, MAXA to remain standing due to onset nausa, and increased level of pain when standing.) General ADL Comments: MAXA donning  briefs in standing while pt holding onto walker, MODA x1 for standing stability.     Mobility   Overal bed mobility: Needs Assistance Bed Mobility: Supine to Sit, Sit to Supine Rolling: Max assist, +2 for physical assistance, Used rails Sidelying to sit: Used rails, Max assist Supine to sit: Mod assist Sit to supine: Max assist Sit to sidelying: Max assist, +2 for physical assistance General bed mobility comments: Pt showed good effort but was ultimately very limited, needed heavy assist with all aspects of trying to get to/from supine     Transfers   Overall transfer level: Needs assistance Equipment used: Rolling walker (2 wheels) Transfers: Sit to/from Stand Sit to Stand: Mod assist, From elevated surface General transfer comment: unable to rise to standing from standard height bed and modA, further cuing for set up and sequencing and ~2" elevation with modA to rise     Ambulation / Gait / Stairs / Wheelchair Mobility   Ambulation/Gait General Gait Details: pt's mentation, pain and weakness all preculded ambulation away from the bed today but he did manage to take a few side steps along EOB with heavy cuing and plenty of UE use on walker.  Attempted marching in place, manage X 2 b/l (knee to RW cross bar) but had buckling and further attempts deferred for safety and pain considerations     Posture / Balance Balance Overall balance assessment: History of Falls, Needs assistance Sitting-balance support: Bilateral upper extremity supported, Feet supported Sitting balance-Leahy Scale: Fair Postural control: Right lateral lean Standing balance support: Bilateral upper extremity supported, During functional activity, Reliant on assistive device for balance Standing balance-Leahy Scale: Poor     Special needs/care consideration Skin surgical incision and Diabetic management yes        Previous Home Environment (from acute therapy documentation) Living Arrangements: Spouse/significant  other Available Help at Discharge: Family Type of Home: House Home Layout: One level Home Access: Stairs to enter Entrance Stairs-Rails: Can reach both Entrance Stairs-Number of Steps: 4 Bathroom Shower/Tub: Tub/shower unit Home Care Services: No Additional Comments: a/o x4   Discharge Living Setting Plans for Discharge Living Setting: Patient's home, Lives with (comment) (spouse) Type of Home at Discharge: House Discharge Home Layout: One level Discharge Home Access: Stairs to enter Entrance Stairs-Rails: Can reach both Entrance Stairs-Number of Steps: 4 Discharge Bathroom Shower/Tub: Tub/shower unit Discharge Bathroom Toilet: Handicapped height Discharge Bathroom Accessibility: Yes How Accessible: Accessible via walker Does the patient have any problems obtaining your medications?: No   Social/Family/Support Systems Patient Roles: Spouse Anticipated Caregiver: spouse, [name]  [phone] Anticipated Caregiver's Contact Information: see above Ability/Limitations of Caregiver: min assist at the absolute most Caregiver Availability: 24/7 Discharge Plan Discussed with Primary Caregiver: Yes Is Caregiver In Agreement with Plan?: Yes Does Caregiver/Family have Issues with Lodging/Transportation while Pt is in Rehab?: No     Goals Patient/Family Goal for Rehab: PT/OT supervision to min assist, SLP n/a Expected length of stay:  14-16 days Additional Information: Discharge plan: home with spouse 24/7, can provide up to min assist for mobility Pt/Family Agrees to Admission and willing to participate: Yes Program Orientation Provided & Reviewed with Pt/Caregiver Including Roles  & Responsibilities: Yes     Decrease burden of Care through IP rehab admission: n/a     Possible need for SNF placement upon discharge:Not anticipated.  Plan for discharge home with spouse providing 24/7 assist and daughter available to assist PRN.      Patient Condition: This patient's condition  remains as documented in the consult dated 05/01/23, in which the Rehabilitation Physician determined and documented that the patient's condition is appropriate for intensive rehabilitative care in an inpatient rehabilitation facility. Will admit to inpatient rehab today.   Preadmission Screen Completed By:  Stephania Fragmin, PT, DPT 05/02/2023 8:42 AM ______________________________________________________________________   Discussed status with Dr. Riley Kill on 05/03/23 at  11:14 AM  and received approval for admission today.   Admission Coordinator:  Stephania Fragmin, time 11:14 AM /Date03/14/25              Revision History

## 2023-05-03 NOTE — Progress Notes (Signed)
 Horton Chin, MD  Physician Physical Medicine and Rehabilitation   Consult Note    Signed   Date of Service: 05/01/2023  1:48 PM  Related encounter: Admission (Current) from 04/30/2023 in [facility] (1A)   Signed     Expand All Collapse All           Physical Medicine and Rehabilitation Consult Reason for Consult: C6-C7 anterior cervical discectomy and fusion and C2-T2 posterior spinal fusion of C2-C4 Referring Physician: Ernestine Mcmurray, MD     HPI: Arnet Hofferber is a 68 y.o. male who is s/p C6-C7 ACDF and C2-C4 laminectomies and C2-T2 PSIF. Course has been complicated by postoperative pain but wife is concerned about oversedation from pain medication. Patient is currently able to stand for 2 min MaxA x2. Physical Medicine & Rehabilitation was consulted to assess candidacy for CIR.     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     Other secondary kyphosis, cervical region     Prediabetes     Primary open angle glaucoma (POAG) of left eye     Pseudoarthrosis of cervical spine (HCC)     Sepsis secondary to UTI (HCC)     Spondylosis of cervical spine               Past Surgical History:  Procedure Laterality Date   ANTERIOR CERVICAL DECOMPRESSION/DISCECTOMY FUSION 4 LEVELS N/A 12/26/2020    Procedure: C3-6 ANTERIOR CERVICAL DECOMPRESSION/DISCECTOMY FUSION 3 LEVELS;  Surgeon: Lucy Chris, MD;  Location: [facility] ORS;  Service: Neurosurgery;  Laterality: N/A;   BROW LIFT AND BLEPHAROPLASTY Bilateral 12/21/2019   CATARACT EXTRACTION W/ INTRAOCULAR LENS IMPLANT Left 02/05/2019   COLONOSCOPY WITH PROPOFOL  N/A 09/29/2021    Procedure: COLONOSCOPY WITH PROPOFOL;  Surgeon: Regis Bill, MD;  Location: [facility];  Service: Endoscopy;  Laterality: N/A;   KNEE SURGERY Left 2002    Meniscus tear   LITHOTRIPSY   12/13/2020   MASS EXCISION N/A 10/11/2022    Procedure: EXCISION MASS CHEST WALL;  Surgeon: Sung Amabile, DO;  Location: [facility] OR[facility];  Service: General;  Laterality: N/A;   ORIF FEMORAL NECK FRACTURE W/ DHS Left 03/06/2020   SHOULDER ARTHROSCOPY WITH OPEN ROTATOR CUFF REPAIR AND DISTAL CLAVICLE ACROMINECTOMY Left 07/25/2021    Procedure: SHOULDER ARTHROSCOPY WITH OPEN ROTATOR CUFF REPAIR AND DISTAL CLAVICLE ACROMINECTOMY;  Surgeon: Juanell Fairly, MD;  Location: [facility] OR[facility];  Service: Orthopedics;  Laterality: Left;   TOTAL HIP ARTHROPLASTY Left 03/06/2020             Family History  Problem Relation Age of Onset   Heart disease Mother     Hypertension Mother     Diabetes Maternal Aunt     Cancer Maternal Uncle          Prostate   Diabetes Paternal Aunt          Social History:  reports that he quit smoking about 3 years ago. His smoking use included cigarettes. He started smoking  about 43 years ago. He has a 20 pack-year smoking history. He has never used smokeless tobacco. He reports that he does not currently use drugs after having used the following drugs: Marijuana. He reports that he does not drink alcohol. Allergies:  Allergies  No Known Allergies         Medications Prior to Admission  Medication Sig Dispense Refill   alfuzosin (UROXATRAL) 10 MG 24 hr tablet Take 1 tablet (10 mg total) by mouth 3 (three) times a week. (Patient taking differently: Take 10 mg by mouth daily.) 13 tablet 11   Aspirin-Salicylamide-Caffeine (BC HEADACHE POWDER PO) Take 1 packet by mouth daily as needed (pain).       bisacodyl (DULCOLAX) 5 MG EC tablet Take 10 mg by mouth daily as needed for moderate constipation.       brimonidine (ALPHAGAN) 0.2 % ophthalmic solution Place 1 drop into  both eyes 2 (two) times daily.       dorzolamide-timolol (COSOPT) 22.3-6.8 MG/ML ophthalmic solution Place 1 drop into both eyes 2 (two) times daily.       latanoprost (XALATAN) 0.005 % ophthalmic solution Place 1 drop into both eyes at bedtime.       meloxicam (MOBIC) 15 MG tablet Take 15 mg by mouth daily.       MOUNJARO 10 MG/0.5ML Pen Inject 10 mg into the skin once a week.       nitrofurantoin, macrocrystal-monohydrate, (MACROBID) 100 MG capsule Take 100 mg by mouth daily.       tadalafil (CIALIS) 5 MG tablet Take 0.5 tablets (2.5 mg total) by mouth 3 (three) times a week. (Patient taking differently: Take 5 mg by mouth as directed. Patient takes at bedtime Sunday, Wednesday, Friday) 7 tablet 11   traMADol (ULTRAM) 50 MG tablet Take 1 tablet (50 mg total) by mouth every 6 (six) hours as needed. 6 tablet 0   acetaminophen (TYLENOL) 500 MG tablet Take 1,000 mg by mouth every 6 (six) hours as needed.              Home: Home Living Family/patient expects to be discharged to:: Private residence Living Arrangements: Spouse/significant other Available Help at Discharge: Family (Daughter/wife) Type of Home: House Home Access: Stairs to enter Secretary/administrator of Steps: 4 Entrance Stairs-Rails: Can reach both (They are unstable) Home Layout: One level Bathroom Shower/Tub: Tub/shower unit Home Equipment: Grab bars - toilet, Grab bars - tub/shower, Hand held shower head, Shower seat, Cane - single point Additional Comments: a/o x4  Functional History: Prior Function Prior Level of Function : Independent/Modified Independent, History of Falls (last six months), Driving Mobility Comments: Cane PRN when legs feel weak ADLs Comments: Indep Functional Status:  Mobility: Bed Mobility Overal bed mobility: Needs Assistance Bed Mobility: Sidelying to Sit, Rolling, Sit to Sidelying Rolling: Max assist, +2 for physical assistance, Used rails Sidelying to sit: Used rails, Max assist Sit to  sidelying: Max assist, +2 for physical assistance General bed mobility comments: Log rolling technique utilized Transfers Overall transfer level: Needs assistance Equipment used: Rolling walker (2 wheels) Transfers: Sit to/from Stand Sit to Stand: Mod assist, +2 physical assistance, From elevated surface General transfer comment: Pt STS from elevated bed height with MODA+2 with RW. Pt stood for ~33mins before overcome with pain and closed eyes and began sitting back down. MAXA +2 to return pt in bed.   ADL: ADL Overall ADL's : Needs assistance/impaired Eating/Feeding: Minimal assistance, Sitting Grooming: Wash/dry face, Maximal assistance (Wife completed while in  bed) Lower Body Dressing: Maximal assistance, Sit to/from stand Functional mobility during ADLs: Rolling walker (2 wheels), +2 for physical assistance, Minimal assistance (RW; Min A for STS, MAXA to remain standing due to onset nausa, and increased level of pain when standing.) General ADL Comments: MAXA donning briefs in standing while pt holding onto walker, MODA x1 for standing stability.   Cognition: Cognition Orientation Level: Oriented X4 Cognition Arousal: Suspect due to medications Behavior During Therapy: WFL for tasks assessed/performed   Blood pressure 139/81, pulse 90, temperature 98.1 F (36.7 C), temperature source Oral, resp. rate 16, height 5\' 7"  (1.702 m), weight 100.7 kg, SpO2 97%. Physical Exam Gen: no distress, normal appearing HEENT: oral mucosa pink and moist, NCAT Cardio: Reg rate Chest: normal effort, normal rate of breathing Abd: soft, non-distended Ext: no edema Psych: pleasant, normal affect Skin: intact Neuro: Alert and oriented x3. 4/5 strength throughout.    Lab Results Last 24 Hours       Results for orders placed or performed during the hospital encounter of 04/30/23 (from the past 24 hours)  Glucose, capillary     Status: Abnormal    Collection Time: 04/30/23  6:16 PM  Result Value  Ref Range    Glucose-Capillary 143 (H) 70 - 99 mg/dL  Basic metabolic panel     Status: Abnormal    Collection Time: 05/01/23  4:52 AM  Result Value Ref Range    Sodium 135 135 - 145 mmol/L    Potassium 3.6 3.5 - 5.1 mmol/L    Chloride 107 98 - 111 mmol/L    CO2 19 (L) 22 - 32 mmol/L    Glucose, Bld 120 (H) 70 - 99 mg/dL    BUN 17 8 - 23 mg/dL    Creatinine, Ser 1.61 0.61 - 1.24 mg/dL    Calcium 8.2 (L) 8.9 - 10.3 mg/dL    GFR, Estimated >09 >60 mL/min    Anion gap 9 5 - 15       Imaging Results (Last 48 hours)  DG Cervical Spine 2-3 Views Result Date: 04/30/2023 CLINICAL DATA:  Elective surgery. EXAM: CERVICAL SPINE - 2-3 VIEW COMPARISON:  Radiograph 04/03/2023 FINDINGS: Eight fluoroscopic spot views of the cervical spine submitted from the operating room. There has been previous anterior C3 through C6 fusion. Interval posterior fusion extending from C2 through the upper thoracic spine. Fluoroscopy time 46.2 seconds. Dose 22.57 mGy. IMPRESSION: Intraoperative fluoroscopy during cervical fusion. Electronically Signed   By: Narda Rutherford M.D.   On: 04/30/2023 20:23    DG C-Arm 1-60 Min-No Report Result Date: 04/30/2023 Fluoroscopy was utilized by the requesting physician.  No radiographic interpretation.    DG C-Arm 1-60 Min-No Report Result Date: 04/30/2023 Fluoroscopy was utilized by the requesting physician.  No radiographic interpretation.    DG C-Arm 1-60 Min-No Report Result Date: 04/30/2023 Fluoroscopy was utilized by the requesting physician.  No radiographic interpretation.    DG C-Arm 1-60 Min-No Report Result Date: 04/30/2023 Fluoroscopy was utilized by the requesting physician.  No radiographic interpretation.    DG C-Arm 1-60 Min-No Report Result Date: 04/30/2023 Fluoroscopy was utilized by the requesting physician.  No radiographic interpretation.    DG C-Arm 1-60 Min-No Report Result Date: 04/30/2023 Fluoroscopy was utilized by the requesting physician.  No  radiographic interpretation.    DG C-Arm 1-60 Min-No Report Result Date: 04/30/2023 Fluoroscopy was utilized by the requesting physician.  No radiographic interpretation.    DG C-Arm 1-60 Min-No Report Result Date: 04/30/2023 Fluoroscopy  was utilized by the requesting physician.  No radiographic interpretation.       Assessment/Plan: Diagnosis: C6-C7 anterior cervical discectomy and fusion, C2-T2 posterior spinal fusion, C2-C4 laminectomy Does the need for close, 24 hr/day medical supervision in concert with the patient's rehab needs make it unreasonable for this patient to be served in a less intensive setting? Yes Co-Morbidities requiring supervision/potential complications:  1) postoperative pain: continue scheduled tylenol 2) Obesity: provide dietary education 3) constipation: continue senna 4) Hyperglycemia 2/2 type 2 diabetes: provide dietary education 5) HTN Due to bladder management, bowel management, safety, skin/wound care, disease management, medication administration, pain management, and patient education, does the patient require 24 hr/day rehab nursing? Yes Does the patient require coordinated care of a physician, rehab nurse, therapy disciplines of PT, OT to address physical and functional deficits in the context of the above medical diagnosis(es)? Yes Addressing deficits in the following areas: balance, endurance, locomotion, strength, transferring, bowel/bladder control, bathing, dressing, feeding, grooming, toileting, and psychosocial support Can the patient actively participate in an intensive therapy program of at least 3 hrs of therapy per day at least 5 days per week? Yes The potential for patient to make measurable gains while on inpatient rehab is excellent Anticipated functional outcomes upon discharge from inpatient rehab are min assist  with PT, supervision with OT, supervision with SLP. Estimated rehab length of stay to reach the above functional goals is: 14-16  days Anticipated discharge destination: Home Overall Rehab/Functional Prognosis: excellent   POST ACUTE RECOMMENDATIONS: This patient's condition is appropriate for continued rehabilitative care in the following setting: CIR Patient has agreed to participate in recommended program. Yes Note that insurance prior authorization may be required for reimbursement for recommended care.     I have personally performed a face to face diagnostic evaluation of this patient. Additionally, I have examined the patient's medical record including any pertinent labs and radiographic images.     Thanks,   Horton Chin, MD 05/01/2023          Routing History

## 2023-05-03 NOTE — Discharge Summary (Signed)
 Discharge Summary  Patient ID: Caleb Wall MRN: [id number] DOB/AGE: May 08, 1955 68 y.o.  Admit date: 04/30/2023 Discharge date: 05/03/2023  Admission Diagnoses:  M47.12 Spondylosis, cervical, with myelopathy  Z98.1 S/P cervical spinal fusion M48.02 Spinal stenosis in cervical region S12.9XXS Pseudoarthrosis of cervical spine, sequela.  Discharge Diagnoses:  Principal Problem:   Pseudoarthrosis of cervical spine (HCC) Active Problems:   Diabetes (HCC)   Cervical myelopathy (HCC)   S/P cervical spinal fusion   Spinal stenosis in cervical region   Right leg weakness   Right arm weakness   Discharged Condition: good  Hospital Course:  Caleb Wall is a 68 y.o presenting with cervical stenosis, myelopathy, and right sided weakness s/p C6-7 ACDF and C2-4 laminectomies, C2-T2 PSF. His intraoperative course was uncomplicated. He was admitted for pain management, therapy evaluation, and drain output monitoring. His post-operative course was complicated by a brief C5 palsy that improved with a short course of steroids. His drain outputs decreased to an acceptable level and were discontinued on postop day 2 and postop day 3 respectively.  He was seen and evaluated by therapy and deemed appropriate for discharge to inpatient rehab.  He was accepted to [facility] inpatient rehab and transferred on postop day 3.  Consults: None  Significant Diagnostic Studies: see results review  Treatments: surgery: as above. Please see separately dictated operative report for further details.   Discharge Exam: Blood pressure (!) 167/80, pulse 78, temperature 99.4 F (37.4 C), temperature source Oral, resp. rate 15, height 5\' 7"  (1.702 m), weight 100.7 kg, SpO2 99%.  AA Ox3 CNI Strength: Side Biceps Triceps Deltoid Interossei Grip Wrist Ext. Wrist Flex.  R 4 5 4 4 5 5 5   L 5 5 4+ 4 5 5 5     Side Iliopsoas Quads Hamstring PF DF EHL  R 4 5 5 4 4 5   L 5 5 5 4 4 5     Posterior drain 75 overnight   Incisions c/d/I without signs of infection   Disposition: Discharge disposition: 02-Transferred to [facility]       Discharge Instructions     Diet - low sodium heart healthy   Complete by: As directed    Incentive spirometry RT   Complete by: As directed       Allergies as of 05/03/2023   No Known Allergies      Medication List     STOP taking these medications    meloxicam 15 MG tablet Commonly known as: MOBIC   traMADol 50 MG tablet Commonly known as: ULTRAM       TAKE these medications    acetaminophen 500 MG tablet Commonly known as: TYLENOL Take 1,000 mg by mouth every 6 (six) hours as needed.   alfuzosin 10 MG 24 hr tablet Commonly known as: UROXATRAL Take 1 tablet (10 mg total) by mouth 3 (three) times a week. What changed: when to take this   [facility] HEADACHE POWDER PO Take 1 packet by mouth daily as needed (pain).   bisacodyl 5 MG EC tablet Commonly known as: DULCOLAX Take 10 mg by mouth daily as needed for moderate constipation.   brimonidine 0.2 % ophthalmic solution Commonly known as: ALPHAGAN Place 1 drop into both eyes 2 (two) times daily.   dorzolamide-timolol 2-0.5 % ophthalmic solution Commonly known as: COSOPT Place 1 drop into both eyes 2 (two) times daily.   latanoprost 0.005 % ophthalmic solution Commonly known as: XALATAN Place 1 drop into both eyes at bedtime.  methocarbamol 500 MG tablet Commonly known as: ROBAXIN Take 1 tablet (500 mg total) by mouth every 6 (six) hours as needed for muscle spasms.   Mounjaro 10 MG/0.5ML Pen Generic drug: tirzepatide Inject 10 mg into the skin once a week.   nitrofurantoin (macrocrystal-monohydrate) 100 MG capsule Commonly known as: MACROBID Take 100 mg by mouth daily.   oxyCODONE 5 MG immediate release tablet Commonly known as: Oxy IR/ROXICODONE Take 1-2 tablets (5-10 mg total) by mouth every 3 (three) hours as needed for moderate pain (pain score 4-6).   senna 8.6 MG  Tabs tablet Commonly known as: SENOKOT Take 1 tablet (8.6 mg total) by mouth 2 (two) times daily as needed for mild constipation.   tadalafil 5 MG tablet Commonly known as: CIALIS Take 0.5 tablets (2.5 mg total) by mouth 3 (three) times a week. What changed:  how much to take when to take this additional instructions         Signed: Lovenia Kim 05/03/2023, 11:11 AM

## 2023-05-03 NOTE — Plan of Care (Signed)

## 2023-05-04 ENCOUNTER — Inpatient Hospital Stay (HOSPITAL_COMMUNITY): Payer: Medicare (Managed Care)

## 2023-05-04 DIAGNOSIS — D62 Acute posthemorrhagic anemia: Secondary | ICD-10-CM | POA: Diagnosis not present

## 2023-05-04 DIAGNOSIS — R739 Hyperglycemia, unspecified: Secondary | ICD-10-CM

## 2023-05-04 DIAGNOSIS — G959 Disease of spinal cord, unspecified: Secondary | ICD-10-CM | POA: Diagnosis not present

## 2023-05-04 DIAGNOSIS — M7989 Other specified soft tissue disorders: Secondary | ICD-10-CM | POA: Diagnosis not present

## 2023-05-04 DIAGNOSIS — D72829 Elevated white blood cell count, unspecified: Secondary | ICD-10-CM | POA: Diagnosis not present

## 2023-05-04 DIAGNOSIS — K5901 Slow transit constipation: Secondary | ICD-10-CM

## 2023-05-04 LAB — CBC
HCT: 27 % — ABNORMAL LOW (ref 39.0–52.0)
Hemoglobin: 9 g/dL — ABNORMAL LOW (ref 13.0–17.0)
MCH: 28.6 pg (ref 26.0–34.0)
MCHC: 33.3 g/dL (ref 30.0–36.0)
MCV: 85.7 fL (ref 80.0–100.0)
Platelets: 199 10*3/uL (ref 150–400)
RBC: 3.15 MIL/uL — ABNORMAL LOW (ref 4.22–5.81)
RDW: 13.3 % (ref 11.5–15.5)
WBC: 13.1 10*3/uL — ABNORMAL HIGH (ref 4.0–10.5)
nRBC: 0 % (ref 0.0–0.2)

## 2023-05-04 LAB — GLUCOSE, CAPILLARY
Glucose-Capillary: 137 mg/dL — ABNORMAL HIGH (ref 70–99)
Glucose-Capillary: 111 mg/dL — ABNORMAL HIGH (ref 70–99)
Glucose-Capillary: 110 mg/dL — ABNORMAL HIGH (ref 70–99)
Glucose-Capillary: 138 mg/dL — ABNORMAL HIGH (ref 70–99)

## 2023-05-04 MED ORDER — TIRZEPATIDE 10 MG/0.5ML ~~LOC~~ SOAJ
10.0000 mg | SUBCUTANEOUS | Status: DC
  Filled 2023-05-04 (×2): qty 0.5

## 2023-05-04 MED ORDER — ENOXAPARIN SODIUM 40 MG/0.4ML IJ SOSY
40.0000 mg | PREFILLED_SYRINGE | INTRAMUSCULAR | Status: DC
Start: 2023-05-04 — End: 2023-05-10
  Administered 2023-05-04 – 2023-05-09 (×6): 40 mg via SUBCUTANEOUS
  Filled 2023-05-04 (×7): qty 0.4

## 2023-05-04 MED ORDER — POLYETHYLENE GLYCOL 3350 17 G PO PACK
17.0000 g | PACK | Freq: Two times a day (BID) | ORAL | Status: DC
  Administered 2023-05-04 – 2023-05-10 (×13): 17 g via ORAL
  Filled 2023-05-04 (×13): qty 1

## 2023-05-04 MED ORDER — SORBITOL 70 % SOLN
30.0000 mL | Freq: Once | Status: AC
Start: 2023-05-04 — End: 2023-05-04
  Administered 2023-05-04: 30 mL via ORAL
  Filled 2023-05-04: qty 30

## 2023-05-04 MED ORDER — DOCUSATE SODIUM 100 MG PO CAPS
100.0000 mg | ORAL_CAPSULE | Freq: Two times a day (BID) | ORAL | Status: DC
  Administered 2023-05-04 – 2023-05-10 (×13): 100 mg via ORAL
  Filled 2023-05-04 (×13): qty 1

## 2023-05-04 MED ORDER — NON FORMULARY: 10.0000 mg | Status: DC

## 2023-05-04 NOTE — Progress Notes (Signed)
 Inpatient Rehabilitation Admission Medication Review by a Pharmacist  A complete drug regimen review was completed for this patient to identify any potential clinically significant medication issues.  High Risk Drug Classes Is patient taking? Indication by Medication  Antipsychotic No   Anticoagulant No   Antibiotic Yes PO nitrofurantoin - UTI ppx  Opioid Yes Oxycodone prn pain  Antiplatelet No   Hypoglycemics/insulin Yes Insulin - DM  Vasoactive Medication Yes Alfuzosin - BPH  Chemotherapy No   Other Yes Brimonidine, Cosopt, Latanoprost-glaucoma  Gabapentin - pain Pantoprazole - reflux Ondansetron prn N/V Methocarbamol prn spasms     Type of Medication Issue Identified Description of Issue Recommendation(s)  Drug Interaction(s) (clinically significant)     Duplicate Therapy     Allergy     No Medication Administration End Date     Incorrect Dose     Additional Drug Therapy Needed     Significant med changes from prior encounter (inform family/care partners about these prior to discharge).    Other       Clinically significant medication issues were identified that warrant physician communication and completion of prescribed/recommended actions by midnight of the next day:  No  Name of provider notified for urgent issues identified:   Provider Method of Notification:     Pharmacist comments: None  Time spent performing this drug regimen review (minutes):  20 minutes  Okey Regal, PharmD

## 2023-05-04 NOTE — Plan of Care (Signed)
  Problem: RH Balance Goal: LTG Patient will maintain dynamic standing with ADLs (OT) Description: LTG:  Patient will maintain dynamic standing balance with assist during activities of daily living (OT)  Flowsheets (Taken 05/04/2023 1257) LTG: Pt will maintain dynamic standing balance during ADLs with: Independent with assistive device   Problem: RH Eating Goal: LTG Patient will perform eating w/assist, cues/equip (OT) Description: LTG: Patient will perform eating with assist, with/without cues using equipment (OT) Flowsheets (Taken 05/04/2023 1257) LTG: Pt will perform eating with assistance level of: Independent with assistive device    Problem: RH Grooming Goal: LTG Patient will perform grooming w/assist,cues/equip (OT) Description: LTG: Patient will perform grooming with assist, with/without cues using equipment (OT) Flowsheets (Taken 05/04/2023 1257) LTG: Pt will perform grooming with assistance level of: Independent with assistive device    Problem: RH Bathing Goal: LTG Patient will bathe all body parts with assist levels (OT) Description: LTG: Patient will bathe all body parts with assist levels (OT) Flowsheets (Taken 05/04/2023 1257) LTG: Pt will perform bathing with assistance level/cueing: Independent with assistive device    Problem: RH Dressing Goal: LTG Patient will perform upper body dressing (OT) Description: LTG Patient will perform upper body dressing with assist, with/without cues (OT). Flowsheets (Taken 05/04/2023 1257) LTG: Pt will perform upper body dressing with assistance level of: Independent with assistive device Goal: LTG Patient will perform lower body dressing w/assist (OT) Description: LTG: Patient will perform lower body dressing with assist, with/without cues in positioning using equipment (OT) Flowsheets (Taken 05/04/2023 1257) LTG: Pt will perform lower body dressing with assistance level of: Independent with assistive device   Problem: RH Toileting Goal:  LTG Patient will perform toileting task (3/3 steps) with assistance level (OT) Description: LTG: Patient will perform toileting task (3/3 steps) with assistance level (OT)  Flowsheets (Taken 05/04/2023 1257) LTG: Pt will perform toileting task (3/3 steps) with assistance level: Independent with assistive device   Problem: RH Functional Use of Upper Extremity Goal: LTG Patient will use RT/LT upper extremity as a (OT) Description: LTG: Patient will use right/left upper extremity as a stabilizer/gross assist/diminished/nondominant/dominant level with assist, with/without cues during functional activity (OT) Flowsheets (Taken 05/04/2023 1257) LTG: Use of upper extremity in functional activities:  RUE as nondominant level  LUE as nondominant level LTG: Pt will use upper extremity in functional activity with assistance level of: Independent with assistive device   Problem: RH Toilet Transfers Goal: LTG Patient will perform toilet transfers w/assist (OT) Description: LTG: Patient will perform toilet transfers with assist, with/without cues using equipment (OT) Flowsheets (Taken 05/04/2023 1257) LTG: Pt will perform toilet transfers with assistance level of: Independent with assistive device   Problem: RH Tub/Shower Transfers Goal: LTG Patient will perform tub/shower transfers w/assist (OT) Description: LTG: Patient will perform tub/shower transfers with assist, with/without cues using equipment (OT) Flowsheets (Taken 05/04/2023 1257) LTG: Pt will perform tub/shower stall transfers with assistance level of: Supervision/Verbal cueing

## 2023-05-04 NOTE — Evaluation (Signed)
 Occupational Therapy Assessment and Plan  Patient Details  Name: Caleb Wall MRN: [id number] Date of Birth: [birth year]  OT Diagnosis: abnormal posture, acute pain at cervical spine, muscle weakness (generalized), and decreased ROM of BUE, decreased activity tolerance, and decreased balance strategies  Rehab Potential: Rehab Potential (ACUTE ONLY): Good ELOS: 7-10 days   Today's Date: 05/04/2023 OT Individual Time: 1020-1130 OT Individual Time Calculation (min): 70 min     Hospital Problem: Principal Problem:   Cervical myelopathy (HCC)   Past Medical History:  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    Other secondary kyphosis, cervical region    Prediabetes    Primary open angle glaucoma (POAG) of left eye    Pseudoarthrosis of cervical spine (HCC)    Sepsis secondary to UTI (HCC)    Spondylosis of cervical spine    Past Surgical History:  Past Surgical History:  Procedure Laterality Date   ANTERIOR CERVICAL DECOMPRESSION/DISCECTOMY FUSION 4 LEVELS N/A 12/26/2020   Procedure: C3-6 ANTERIOR CERVICAL DECOMPRESSION/DISCECTOMY FUSION 3 LEVELS;  Surgeon: Lucy Chris, MD;  Location: [facility];  Service: Neurosurgery;  Laterality: N/A;   BROW LIFT AND BLEPHAROPLASTY Bilateral 12/21/2019   CATARACT EXTRACTION W/ INTRAOCULAR LENS IMPLANT Left 02/05/2019   COLONOSCOPY WITH PROPOFOL N/A 09/29/2021   Procedure: COLONOSCOPY WITH PROPOFOL;  Surgeon: Regis Bill, MD;  Location: [facility];  Service: Endoscopy;  Laterality: N/A;   KNEE SURGERY Left 2002   Meniscus tear   LITHOTRIPSY  12/13/2020   MASS EXCISION N/A 10/11/2022   Procedure: EXCISION MASS CHEST WALL;  Surgeon: Sung Amabile, DO;  Location: [facility];  Service: General;  Laterality: N/A;   ORIF FEMORAL NECK FRACTURE W/ DHS Left 03/06/2020   SHOULDER ARTHROSCOPY WITH OPEN ROTATOR CUFF REPAIR AND DISTAL CLAVICLE ACROMINECTOMY Left 07/25/2021   Procedure: SHOULDER ARTHROSCOPY WITH OPEN ROTATOR CUFF REPAIR AND DISTAL CLAVICLE ACROMINECTOMY;  Surgeon: Juanell Fairly, MD;  Location: [facility];  Service: Orthopedics;  Laterality: Left;   TOTAL HIP ARTHROPLASTY Left 03/06/2020    Assessment & Plan Clinical Impression: Patient is a 68 year old right-handed male with history of BPH, diabetes mellitus, hepatitis C, hypertension, hyperlipidemia, left THA 03/06/2020, quit smoking 3 years ago, ACDF 12/26/2020. Per chart review patient lives with spouse. 1 level home 4 steps to entry. Independent with occasional cane prior to admission. Presented to [facility] 04/30/2023 with progressive neck pain, weakness/numbness tingling and loss of hand function especially over the past few months to 1 year as well as weakness right lower extremity and right upper extremity. 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 Underwent anterior cervical discectomy and fusion C6-7 placement of interbody device with posterior segmental instrumentation C2 to-T2 posterior cervical thoracic arthrodesis C2 to-T2/cervical laminectomy C2-C4 04/30/2023 per Dr. Ernestine Mcmurray. Anterior drain removed 3/13 and await plan for removal of posterior drain. Patient did complete Decadron taper. Placed on Lovenox for DVT prophylaxis 05/01/2023.   Patient currently requires mod-max A with basic self-care skills secondary to muscle weakness and muscle joint tightness, decreased cardiorespiratoy endurance, unbalanced muscle activation, decreased safety awareness, and decreased standing  balance, decreased balance strategies, and difficulty maintaining precautions.  Prior to hospitalization, patient could complete BADLs  with overall Min A.   Patient will benefit from skilled intervention to increase independence with basic self-care skills prior to discharge home with care partner.  Anticipate patient will require 24 hour supervision and follow up home health.  OT - End of Session Endurance Deficit: Yes OT Assessment Rehab Potential (ACUTE ONLY): Good OT Barriers to Discharge: Neurogenic Bowel & Bladder;Wound Care;Weight OT Patient demonstrates impairments in the following area(s): Balance;Safety;Endurance;Motor;Pain;Perception;Skin Integrity;Sensory OT Basic ADL's Functional Problem(s): Eating;Grooming;Bathing;Dressing;Toileting OT Transfers Functional Problem(s): Toilet;Tub/Shower OT Additional Impairment(s): Fuctional Use of Upper Extremity OT Plan OT Intensity: Minimum of 1-2 x/day, 45 to 90 minutes OT Frequency: 5 out of 7 days OT Duration/Estimated Length of Stay: 7-10 days OT Treatment/Interventions: Discharge planning;Balance/vestibular training;Community reintegration;Disease mangement/prevention;DME/adaptive equipment instruction;Functional electrical stimulation;Functional mobility training;Neuromuscular re-education;Pain management;Patient/family education;Psychosocial support;Splinting/orthotics;UE/LE Strength taining/ROM;Visual/perceptual remediation/compensation;Therapeutic Exercise;Wheelchair propulsion/positioning;Skin care/wound managment;Self Care/advanced ADL retraining;Therapeutic Activities;UE/LE Coordination activities OT Self Feeding Anticipated Outcome(s): Mod I OT Basic Self-Care Anticipated Outcome(s): Mod I OT Toileting Anticipated Outcome(s): Mod I OT Bathroom Transfers Anticipated Outcome(s): Mod I OT Recommendation Patient destination: Home Follow Up Recommendations: Home health OT Equipment Recommended: To be determined   OT Evaluation Precautions/Restrictions  Precautions Precautions: Cervical;Other (comment) (Hemovac drain to surgical site.) Precaution/Restrictions  Comments: No cervical brace ordered. Restrictions Weight Bearing Restrictions Per Provider Order: No General Chart Reviewed: Yes Family/Caregiver Present: No Pain Pain Assessment Pain Scale: 0-10 Pain Score: 10-Worst pain ever Pain Type: Acute pain;Surgical pain Pain Location: Neck Pain Orientation: Posterior Pain Descriptors / Indicators: Aching Pain Intervention(s): Medication (See eMAR) Home Living/Prior Functioning Home Living Family/patient expects to be discharged to:: Private residence Living Arrangements: Spouse/significant other Available Help at Discharge: Family, Available 24 hours/day Type of Home: House Home Access: Stairs to enter [employer] of Steps: 4-5 Entrance Stairs-Rails: Right, Left, Can reach both Home Layout: One level Bathroom Shower/Tub: Tub/shower unit Public relations account executive, detachable shower head, multiple grab bars) Bathroom Toilet: Handicapped height Bathroom Accessibility: Yes  Lives With: Spouse IADL History Homemaking Responsibilities: Yes Current License: Yes Occupation: Retired 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 Prior Function Level of Independence: Requires assistive device for independence, Needs assistance with ADLs Dressing: Minimal (Buttons) Feeding: Minimal (Opening containers and loading food on utensil) Driving: Yes Vocation: Retired Administrator, sports Baseline Vision/History: 1 Wears glasses;3 Glaucoma (Readers, Severity of glaucoma (L>R)) Ability to See in Adequate Light: 1 Impaired Patient Visual Report: No change from baseline Vision Assessment?: No apparent visual deficits Perception  Perception: Within Functional Limits Praxis Praxis: WFL Cognition Cognition Overall Cognitive Status: Within Functional Limits for tasks assessed Arousal/Alertness: Awake/alert Orientation Level: Person;Place;Situation Person: Oriented Place: Oriented Situation:  Oriented Memory: Appears intact Attention: Sustained Sustained Attention: Impaired Sustained Attention Impairment: Functional complex;Verbal complex Awareness: Appears intact Problem Solving: Appears intact Executive Function: Decision Making Decision Making: Impaired Decision Making Impairment: Functional complex;Functional basic Safety/Judgment: Impaired Comments: Decreased safety during mobilty (ie. not locking WC before standing, standing without RW) Brief Interview for Mental Status (BIMS) Repetition of Three Words (First Attempt): 3 Temporal Orientation: Year: Correct Temporal Orientation: Month: Accurate within 5 days Temporal Orientation: Day: Correct Recall: "Sock": Yes, after cueing ("something to wear") Recall: "Blue": Yes, after cueing ("a color") Recall: "Bed": No, could not recall BIMS Summary Score: 11 Sensation Sensation Light Touch: Impaired Detail Peripheral sensation comments: No reports of numbness/tingling in B-feet, improving numbness in B-hands (greater deficits in R>L) Light Touch  Impaired Details: Impaired RUE;Impaired LUE Hot/Cold: Appears Intact Proprioception: Appears Intact Stereognosis: Impaired by gross assessment Coordination Gross Motor Movements are Fluid and Coordinated: No Fine Motor Movements are Fluid and Coordinated: No Coordination and Movement Description: Deficits due to surgical pain, generalized weakness, and decreased sensation in B-hands. Finger Nose Finger Test: Mildly dysmetric 9 Hole Peg Test: R ~50 secs; L ~42.5 Motor  Motor Motor: Abnormal postural alignment and control;Other (comment) Motor - Skilled Clinical Observations: Decreased coordination due to numbness/weakness in B-hands.  Trunk/Postural Assessment  Cervical Assessment Cervical Assessment: Exceptions to [facility] (Limitations due to cervical precautions) Thoracic Assessment Thoracic Assessment: Exceptions to [facility] (Rounded shoulders) Lumbar Assessment Lumbar  Assessment: Within Functional Limits Postural Control Postural Control: Deficits on evaluation Righting Reactions: Delayed Protective Responses: Delayed  Balance Balance Balance Assessed: Yes Static Sitting Balance Static Sitting - Balance Support: Feet supported Static Sitting - Level of Assistance: 5: Stand by assistance (SUP) Dynamic Sitting Balance Dynamic Sitting - Balance Support: During functional activity;Feet supported Dynamic Sitting - Level of Assistance: 5: Stand by assistance (SUP) Dynamic Sitting - Balance Activities: Lateral lean/weight shifting;Reaching for objects Static Standing Balance Static Standing - Balance Support: During functional activity;Bilateral upper extremity supported Static Standing - Level of Assistance: 5: Stand by assistance (CGA) Dynamic Standing Balance Dynamic Standing - Balance Support: During functional activity;Bilateral upper extremity supported Dynamic Standing - Level of Assistance: 5: Stand by assistance;4: Min assist (CGA-Min A) Dynamic Standing - Balance Activities: Lateral lean/weight shifting;Reaching for objects Extremity/Trunk Assessment RUE Assessment RUE Assessment: Exceptions to [facility] Active Range of Motion (AROM) Comments: ~80 degrees of shoulder flexion/abduction (seated EOB); WFL for elbows; Dysmetria General Strength Comments: 2-/5 LUE Assessment LUE Assessment: Exceptions to [facility] Active Range of Motion (AROM) Comments: ~80 degrees of shoulder flexion/abduction (seated EOB); WFL for elbows; Dysmetria. Reports history of RTC surgery. General Strength Comments: 2-/5  Care Tool Care Tool Self Care Eating   Eating Assist Level: Set up assist    Oral Care    Oral Care Assist Level: Set up assist    Bathing   Body parts bathed by patient: Right arm;Left arm;Chest;Abdomen;Front perineal area;Right upper leg;Left upper leg;Face Body parts bathed by helper: Buttocks;Right lower leg;Left lower leg   Assist Level: Moderate  Assistance - Patient 50 - 74%    Upper Body Dressing(including orthotics)   What is the patient wearing?: Pull over shirt   Assist Level: Moderate Assistance - Patient 50 - 74%    Lower Body Dressing (excluding footwear)   What is the patient wearing?: Underwear/pull up;Pants Assist for lower body dressing: Maximal Assistance - Patient 25 - 49%    Putting on/Taking off footwear   What is the patient wearing?: Ted hose;Non-skid slipper socks Assist for footwear: Total Assistance - Patient < 25%       Care Tool Toileting Toileting activity   Assist for toileting: Minimal Assistance - Patient > 75%     Care Tool Bed Mobility Roll left and right activity   Roll left and right assist level: Supervision/Verbal cueing    Sit to lying activity   Sit to lying assist level: Minimal Assistance - Patient > 75%    Lying to sitting on side of bed activity   Lying to sitting on side of bed assist level: the ability to move from lying on the back to sitting on the side of the bed with no back support.: Minimal Assistance - Patient > 75%     Care Tool Transfers Sit to stand transfer  Sit to stand assist level: Minimal Assistance - Patient > 75%    Chair/bed transfer   Chair/bed transfer assist level: Minimal Assistance - Patient > 75%     Toilet transfer   Assist Level: Minimal Assistance - Patient > 75%     Care Tool Cognition  Expression of Ideas and Wants Expression of Ideas and Wants: 4. Without difficulty (complex and basic) - expresses complex messages without difficulty and with speech that is clear and easy to understand  Understanding Verbal and Non-Verbal Content Understanding Verbal and Non-Verbal Content: 4. Understands (complex and basic) - clear comprehension without cues or repetitions   Memory/Recall Ability Memory/Recall Ability : Current season;Location of own room;That he or she is in a hospital/hospital unit   Refer to Care Plan for Long Term Goals  SHORT TERM  GOAL WEEK 1 OT Short Term Goal 1 (Week 1): STGs=LTGs due to patient's estimated length of stay.  Recommendations for other services: Therapeutic Recreation  Pet therapy, Stress management, and Outing/community reintegration   Skilled Therapeutic Intervention  Session began with introduction to OT role, OT POC, and general orientation to rehab unit/schedule. Pt presents having already completed sponge-bathing and dressing with PT, therefore the levels of assistance noted below based on simulated movements and assistance provided by PT. Pt toilets x2 during session, referencing chronic issues with bladder/bowel management since first surgery. Pt most limited by pain at surgical site, but performance also impacted by high distractibility and tangential speech. 9-hole peg test completed, results noted above. Propulsion from [employer] therapy gym with supervision and intermediate use of BUE and/or BLE. Pt remained resting in bed, all needs within reach, bed alarm activated.   ADL ADL Eating: Set up;Minimal assistance Where Assessed-Eating: Edge of bed Grooming: Setup;Minimal assistance Where Assessed-Grooming: Edge of bed Upper Body Bathing: Minimal assistance;Moderate assistance Where Assessed-Upper Body Bathing: Edge of bed Lower Body Bathing: Moderate assistance;Maximal assistance Where Assessed-Lower Body Bathing: Edge of bed Upper Body Dressing: Minimal assistance;Moderate assistance Where Assessed-Upper Body Dressing: Edge of bed Lower Body Dressing: Moderate assistance;Maximal assistance Where Assessed-Lower Body Dressing: Edge of bed Toileting: Minimal assistance Where Assessed-Toileting: Metallurgist Method: Proofreader: Bedside commode;Grab bars (RW) Tub/Shower Transfer: Unable to assess Film/video editor Method: Unable to assess Mobility  Transfers Sit to Stand: Contact Guard/Touching  assist;Minimal Assistance - Patient > 75% Stand to Sit: Contact Guard/Touching assist   Discharge Criteria: Patient will be discharged from OT if patient refuses treatment 3 consecutive times without medical reason, if treatment goals not met, if there is a change in medical status, if patient makes no progress towards goals or if patient is discharged from hospital.  The above assessment, treatment plan, treatment alternatives and goals were discussed and mutually agreed upon: by patient  Lou Cal, OTR/L, MSOT  05/04/2023, 12:39 PM

## 2023-05-04 NOTE — Progress Notes (Addendum)
 PROGRESS NOTE   Subjective/Complaints:  Pt doing ok, but fixated on the fact that his diet order is carb mod and he insists it should be regular diet. Upset with how staff member took his order this morning. Also upset about getting SSI-- never on insulin at home, wants to bring his mounjaro to the hospital instead. Explained at length both of these topics. Understands though still perseverates on it.  Otherwise, states he slept poorly overnight. Pain well managed overall. LBM Monday 3/10, but states this is normal for him to go once a week-- however, agreeable to try meds to help with this.  Urinating fine. Denies any other complaints or concerns today.   ROS: as per HPI. Denies CP, SOB, abd pain, N/V/D, or any other complaints at this time.     Objective:   VAS [place] LOWER EXTREMITY VENOUS (DVT) Result Date: 05/04/2023  Lower Venous DVT Study Patient Name:  Caleb Wall  Date of Exam:   05/04/2023 Medical Rec #: [id number]        Accession #:    [id number] Date of Birth: 21-Jan-1956         Patient Gender: M Patient Age:   68 years Exam Location:  [facility] Procedure:      VAS [place] LOWER EXTREMITY VENOUS (DVT) Referring Phys: Mariam Dollar --------------------------------------------------------------------------------  Indications: Swelling.  Risk Factors: None identified. Comparison Study: No prior studies. Performing Technologist: Chanda Busing RVT  Examination Guidelines: A complete evaluation includes B-mode imaging, spectral Doppler, color Doppler, and power Doppler as needed of all accessible portions of each vessel. Bilateral testing is considered an integral part of a complete examination. Limited examinations for reoccurring indications may be performed as noted. The reflux portion of the exam is performed with the patient in reverse Trendelenburg.  +---------+---------------+---------+-----------+----------+--------------+  RIGHT    CompressibilityPhasicitySpontaneityPropertiesThrombus Aging +---------+---------------+---------+-----------+----------+--------------+ CFV      Full           Yes      Yes                                 +---------+---------------+---------+-----------+----------+--------------+ SFJ      Full                                                        +---------+---------------+---------+-----------+----------+--------------+ FV Prox  Full                                                        +---------+---------------+---------+-----------+----------+--------------+ FV Mid   Full                                                        +---------+---------------+---------+-----------+----------+--------------+  FV DistalFull                                                        +---------+---------------+---------+-----------+----------+--------------+ PFV      Full                                                        +---------+---------------+---------+-----------+----------+--------------+ POP      Full           Yes      Yes                                 +---------+---------------+---------+-----------+----------+--------------+ PTV      Full                                                        +---------+---------------+---------+-----------+----------+--------------+ PERO     Full                                                        +---------+---------------+---------+-----------+----------+--------------+   +---------+---------------+---------+-----------+----------+--------------+ LEFT     CompressibilityPhasicitySpontaneityPropertiesThrombus Aging +---------+---------------+---------+-----------+----------+--------------+ CFV      Full           Yes      Yes                                 +---------+---------------+---------+-----------+----------+--------------+ SFJ      Full                                                         +---------+---------------+---------+-----------+----------+--------------+ FV Prox  Full                                                        +---------+---------------+---------+-----------+----------+--------------+ FV Mid   Full                                                        +---------+---------------+---------+-----------+----------+--------------+ FV DistalFull                                                        +---------+---------------+---------+-----------+----------+--------------+  PFV      Full                                                        +---------+---------------+---------+-----------+----------+--------------+ POP      Full           Yes      Yes                                 +---------+---------------+---------+-----------+----------+--------------+ PTV      Full                                                        +---------+---------------+---------+-----------+----------+--------------+ PERO     Full                                                        +---------+---------------+---------+-----------+----------+--------------+     Summary: RIGHT: - There is no evidence of deep vein thrombosis in the lower extremity.  - No cystic structure found in the popliteal fossa.  LEFT: - There is no evidence of deep vein thrombosis in the lower extremity.  - No cystic structure found in the popliteal fossa.  *See table(s) above for measurements and observations.    Preliminary    DG Chest 2 View Result Date: 05/03/2023 CLINICAL DATA:  Fever EXAM: CHEST - 2 VIEW COMPARISON:  04/20/2014 FINDINGS: The heart size and mediastinal contours are within normal limits. Minimal linear atelectasis at the left lung base. Lungs are otherwise clear. No pneumothorax. Partially imaged anterior and posterior cervical fusion hardware. Surgical staples overlie the lower neck and upper back posteriorly. A surgical drain  is also present at this location. IMPRESSION: Minimal linear atelectasis at the left lung base. Lungs are otherwise clear. Electronically Signed   By: Duanne Guess D.O.   On: 05/03/2023 12:06   Recent Labs    05/03/23 0835 05/04/23 0544  WBC 11.4* 13.1*  HGB 9.3* 9.0*  HCT 27.2* 27.0*  PLT 171 199   No results for input(s): "NA", "K", "CL", "CO2", "GLUCOSE", "BUN", "CREATININE", "CALCIUM" in the last 72 hours.       Intake/Output Summary (Last 24 hours) at 05/04/2023 1328 Last data filed at 05/04/2023 1312 Gross per 24 hour  Intake 760 ml  Output 1500 ml  Net -740 ml        Physical Exam: Vital Signs Blood pressure (!) 145/90, pulse 82, temperature 98.5 F (36.9 C), resp. rate 18, height 5\' 7"  (1.702 m), SpO2 99%.  Constitutional: No distress . Vital signs reviewed. Resting comfortably in bed.  HEENT: NCAT, EOMI, oral membranes a little dry Neck: supple, anterior wound dressing in place Cardiovascular: RRR without m/r/g appreciated. No JVD    Respiratory/Chest: CTA Bilaterally without wheezes or rales. Normal effort    GI/Abdomen: soft, +BS throughout, non-tender, non-distended Ext: no clubbing, cyanosis, or edema Psych: a bit irritable and perseverative, but ultimately pleasant and cooperative  Skin: dry, warm MSK: able to move all extremities antigravity  PRIOR EXAMS: Neurological:     Mental Status: He is alert.     Comments: Alert and oriented x 3. Normal insight and awareness. Intact Memory. Normal language and speech. Cranial nerve exam unremarkable. MMT:  UE grossly 4/5 prox to distal, perhaps a bit of HI weakness. BLE 4/5 prox to distal. Decreased LT in finger tips of both hands. DTR's 1+. No abnl resting tone.   Assessment/Plan: 1. Functional deficits which require 3+ hours per day of interdisciplinary therapy in a comprehensive inpatient rehab setting. Physiatrist is providing close team supervision and 24 hour management of active medical problems listed  below. Physiatrist and rehab team continue to assess barriers to discharge/monitor patient progress toward functional and medical goals  Care Tool:  Bathing    Body parts bathed by patient: Right arm, Left arm, Chest, Abdomen, Front perineal area, Right upper leg, Left upper leg, Face   Body parts bathed by helper: Buttocks, Right lower leg, Left lower leg     Bathing assist Assist Level: Moderate Assistance - Patient 50 - 74%     Upper Body Dressing/Undressing Upper body dressing   What is the patient wearing?: Pull over shirt    Upper body assist Assist Level: Moderate Assistance - Patient 50 - 74%    Lower Body Dressing/Undressing Lower body dressing      What is the patient wearing?: Underwear/pull up, Pants     Lower body assist Assist for lower body dressing: Maximal Assistance - Patient 25 - 49%     Toileting Toileting    Toileting assist Assist for toileting: Minimal Assistance - Patient > 75%     Transfers Chair/bed transfer  Transfers assist     Chair/bed transfer assist level: Minimal Assistance - Patient > 75%     Locomotion Ambulation   Ambulation assist      Assist level: Minimal Assistance - Patient > 75% Assistive device: Walker-rolling Max distance: 30   Walk 10 feet activity   Assist     Assist level: Minimal Assistance - Patient > 75% Assistive device: Walker-rolling   Walk 50 feet activity   Assist Walk 50 feet with 2 turns activity did not occur: Safety/medical concerns         Walk 150 feet activity   Assist Walk 150 feet activity did not occur: Safety/medical concerns         Walk 10 feet on uneven surface  activity   Assist     Assist level: Minimal Assistance - Patient > 75% (ramp) Assistive device: Walker-rolling   Wheelchair     Assist Is the patient using a wheelchair?: Yes Type of Wheelchair: Manual    Wheelchair assist level: Supervision/Verbal cueing Max wheelchair distance: 100     Wheelchair 50 feet with 2 turns activity    Assist        Assist Level: Supervision/Verbal cueing   Wheelchair 150 feet activity     Assist      Assist Level: Moderate Assistance - Patient 50 - 74%   Blood pressure (!) 145/90, pulse 82, temperature 98.5 F (36.9 C), resp. rate 18, height 5\' 7"  (1.702 m), SpO2 99%.  Medical Problem List and Plan: 1. Functional deficits secondary to cervical stenosis/myelopathy status post C6-7 ACDF and C2-4 laminectomies, C2-T2 PSF -has history of C3-C6 ACDF 12/26/2020. -NO BRACE REQUIRED.Marland Kitchen   -patient may not shower             -  ELOS/Goals: 14-16 days, supervision to min assist goals with PT, OT    -CIR evals today 05/04/23 2.  Antithrombotics: -DVT/anticoagulation:  Pharmaceutical: Lovenox 40mg  daily             -antiplatelet therapy: N/A  -05/04/23 vascular U/S neg for DVTs 3. Pain Management: Neurontin 300 mg 3 times daily; Tylenol, Robaxin, and oxycodone as needed 4. Mood/Behavior/Sleep: Provide emotional support             -antipsychotic agents: N/A -05/04/23 didn't sleep well but perseverates on other issues so doesn't really tell me if he wants anything for this; monitor for now 5. Neuropsych/cognition: This patient is capable of making decisions on his own behalf. 6. Skin/Wound Care: Routine skin checks -continue posterior surgical site drain until output is less than 100 cc over 24 hours or less than 50 cc over 12 hours.  -drain became disconnected during transfer from [facility]. Tubing was reconnected and I recharged the hemovac container. Observe tonight for functionality 7. Fluids/Electrolytes/Nutrition: Routine I&O's with follow-up chemistries -05/04/23 pt very adamant that he be on regular diet; on carb mod, explained rationale behind this. Pt agreeable in the end.  8.  BPH.  Uroxatrol 10 mg daily, Cialis 2.5 mg daily (not restarted?), continue prophylactic Macrobid 100 mg daily 9.  Constipation.  Senokot 1 tablet twice daily,  Colace 100 mg twice daily ---> change to senokot-s 2 qhs             -LBM 3/11 -pt says that he feels like he needs to empty. Will have dulcolax suppository available as needed. Sorbitol prn also.  -05/04/23 no BM since Monday, will start miralax BID, colace BID, continue senokot 2 tabs nightly, and use sorbitol 30ml once now.  10.  Diabetes mellitus.  Latest hemoglobin A1c 6.1, recheck Monday.  Patient on Mounjaro 10 mg weekly prior to admission. SSI, CBG checks.  -pt states that family will bring it in. -05/04/23 pt upset that he's on SSI, explained why; waiting on mounjaro to be brought in as well; for now continue SSI; CBGs are ok.  CBG (last 3)  Recent Labs    05/03/23 2051 05/04/23 0618 05/04/23 1127  GLUCAP 200* 137* 111*    11.  Primary open-angle glaucoma left eye.  Continue eyedrops 12. ABLA: Hgb 9.0 on 05/04/23, relatively stable; monitor 13. Leukocytosis:  -05/04/23 was 11.4 on 3/14, up to 13.1 on 3/15, but did take decadron recently... monitor on Monday, no s/sx of infection at this point 14. GERD: continue protonix 40mg  daily   I spent >17mins performing patient care related activities, including prolonged face to face time, documentation time, med management, discussion of meds and diet with patient, and overall coordination of care.    LOS: 1 days A FACE TO FACE EVALUATION WAS PERFORMED  [address] 05/04/2023, 1:28 PM

## 2023-05-04 NOTE — Progress Notes (Signed)
 Bilateral lower extremity venous duplex has been completed. Preliminary results can be found in CV Proc through chart review.   05/04/23 10:35 AM Olen Cordial RVT

## 2023-05-04 NOTE — Evaluation (Signed)
 Physical Therapy Assessment and Plan  Patient Details  Name: Caleb Wall MRN: [id number] Date of Birth: [birth year]  PT Diagnosis: Abnormality of gait, Muscle weakness, and Pain in neck Rehab Potential: Good ELOS: 7-10 days.   Today's Date: 05/04/2023 PT Individual Time: 9562-1308 and 800-915 PT Individual Time Calculation (min): 57 min  and 75 min.  Hospital Problem: Principal Problem:   Cervical myelopathy (HCC)   Past Medical History:  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    Other secondary kyphosis, cervical region    Prediabetes    Primary open angle glaucoma (POAG) of left eye    Pseudoarthrosis of cervical spine (HCC)    Sepsis secondary to UTI (HCC)    Spondylosis of cervical spine    Past Surgical History:  Past Surgical History:  Procedure Laterality Date   ANTERIOR CERVICAL DECOMPRESSION/DISCECTOMY FUSION 4 LEVELS N/A 12/26/2020   Procedure: C3-6 ANTERIOR CERVICAL DECOMPRESSION/DISCECTOMY FUSION 3 LEVELS;  Surgeon: Lucy Chris, MD;  Location: [facility];  Service: Neurosurgery;  Laterality: N/A;   BROW LIFT AND BLEPHAROPLASTY Bilateral 12/21/2019   CATARACT EXTRACTION W/ INTRAOCULAR LENS IMPLANT Left 02/05/2019   COLONOSCOPY WITH PROPOFOL N/A 09/29/2021   Procedure: COLONOSCOPY WITH PROPOFOL;  Surgeon: Regis Bill, MD;  Location: [facility];  Service: Endoscopy;  Laterality: N/A;   KNEE SURGERY Left 2002   Meniscus tear   LITHOTRIPSY  12/13/2020   MASS EXCISION N/A 10/11/2022   Procedure: EXCISION MASS CHEST WALL;  Surgeon: Sung Amabile, DO;  Location: [facility];  Service: General;  Laterality: N/A;   ORIF FEMORAL NECK FRACTURE W/ DHS Left 03/06/2020    SHOULDER ARTHROSCOPY WITH OPEN ROTATOR CUFF REPAIR AND DISTAL CLAVICLE ACROMINECTOMY Left 07/25/2021   Procedure: SHOULDER ARTHROSCOPY WITH OPEN ROTATOR CUFF REPAIR AND DISTAL CLAVICLE ACROMINECTOMY;  Surgeon: Juanell Fairly, MD;  Location: [facility];  Service: Orthopedics;  Laterality: Left;   TOTAL HIP ARTHROPLASTY Left 03/06/2020    Assessment & Plan Clinical Impression: Caleb Wall is a 68 year old right-handed male with history of BPH, diabetes mellitus, hepatitis C, hypertension, hyperlipidemia, left THA 03/06/2020, quit smoking 3 years ago, ACDF 12/26/2020. Per chart review patient lives with spouse. 1 level home 4 steps to entry. Independent with occasional cane prior to admission. Presented to [facility] 04/30/2023 with progressive neck pain, weakness/numbness tingling and loss of hand function especially over the past few months to 1 year as well as weakness right lower extremity and right upper extremity. 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 Underwent anterior cervical discectomy and fusion C6-7 placement of interbody device with posterior segmental instrumentation C2 to-T2 posterior cervical thoracic arthrodesis C2 to-T2/cervical laminectomy C2-C4 04/30/2023 per Dr. Ernestine Mcmurray. Anterior drain removed 3/13 and await plan for removal of posterior drain. Patient did complete Decadron taper. Placed on Lovenox for DVT prophylaxis 05/01/2023. Therapy evaluations completed due to patient decreased functional mobility was admitted for a comprehensive rehab program.   Patient currently requires min with mobility secondary to muscle weakness and decreased postural control.  Prior to hospitalization, patient was independent  with mobility and lived  with Spouse in a House home.  Home access is 4-5Stairs to enter.  Patient will benefit from skilled PT intervention to maximize safe functional mobility, minimize fall risk, and decrease  caregiver burden for planned discharge home with 24 hour supervision.  Anticipate patient will benefit from follow up OP at discharge.  PT - End of Session Activity Tolerance: Tolerates 10 - 20 min activity with multiple rests Endurance Deficit: Yes PT Assessment Rehab Potential (ACUTE/IP ONLY): Good PT Barriers to Discharge: Inaccessible home environment PT Patient demonstrates impairments in the following area(s): Balance;Safety;Endurance;Motor;Pain PT Transfers Functional Problem(s): Bed Mobility;Bed to Chair;Car;Furniture PT Locomotion Functional Problem(s): Stairs;Ambulation;Wheelchair Mobility PT Plan PT Intensity: Minimum of 1-2 x/day ,45 to 90 minutes PT Frequency: 5 out of 7 days PT Duration Estimated Length of Stay: 7-10 days. PT Treatment/Interventions: Ambulation/gait training;Community reintegration;Neuromuscular re-education;Stair training;UE/LE Strength taining/ROM;Wheelchair propulsion/positioning;UE/LE Coordination activities;Therapeutic Activities;Pain management;Discharge planning;Balance/vestibular training;Functional mobility training;Patient/family education;Therapeutic Exercise PT Transfers Anticipated Outcome(s): Mod I PT Locomotion Anticipated Outcome(s): Mod I PT Recommendation Follow Up Recommendations: Outpatient PT Patient destination: Home Equipment Recommended: To be determined;Rolling walker with 5" wheels   PT Evaluation Precautions/Restrictions Precautions Precautions: Cervical;Other (comment) (hemovac drain.) Precaution/Restrictions Comments: No cervical brace ordered. Restrictions Weight Bearing Restrictions Per Provider Order: No General Chart Reviewed: Yes Family/Caregiver Present: No Vital SignsTherapy Vitals Temp: 98.5 F (36.9 C) Pulse Rate: 82 Resp: 18 BP: (!) 145/90 Patient Position (if appropriate): Lying Oxygen Therapy SpO2: 99 % O2 Device: Room Air Pain: 10/10 in PM session Pain Assessment Pain Scale: 0-10 Pain Score: 8   Pain Type: Surgical pain Pain Location: Neck Pain Orientation: Posterior Pain Descriptors / Indicators: Aching;Constant Pain Frequency: Constant Pain Onset: On-going Patients Stated Pain Goal: 5 Pain Intervention(s): Medication (See eMAR) Pain Interference Pain Interference Pain Effect on Sleep: 3. Frequently Pain Interference with Therapy Activities: 2. Occasionally Pain Interference with Day-to-Day Activities: 2. Occasionally Home Living/Prior Functioning Home Living Available Help at Discharge: Family;Available 24 hours/day Type of Home: House Home Access: Stairs to enter [employer] of Steps: 4-5 Entrance Stairs-Rails: Right;Left;Can reach both Bathroom Shower/Tub: Tub/shower unit  Lives With: Spouse Prior Function Level of Independence: Independent with transfers;Independent with gait;Requires assistive device for independence  Able to Take Stairs?: Yes Driving: Yes Vocation: Retired Museum/gallery exhibitions officer Overall Cognitive Status: Within Functional Limits for tasks assessed Arousal/Alertness: Awake/alert Sustained Attention: Impaired Safety/Judgment: Impaired (somewhat impulsive.) Sensation Sensation Light Touch: Appears Intact Peripheral sensation comments: Pt states decreased numbness to BUEs and LEs Light Touch Impaired Details: Impaired RUE;Impaired LUE Hot/Cold: Appears Intact Proprioception: Appears Intact Stereognosis: Impaired by gross assessment Coordination Gross Motor Movements are Fluid and Coordinated: No Fine Motor Movements are Fluid and Coordinated: No Coordination and Movement Description: Deficits due to surgical pain, generalized weakness, and decreased sensation in B-hands. Finger Nose Finger Test: Mildly dysmetric Heel Shin Test: appropriate. 9 Hole Peg Test: R ~50 secs; L ~42.5 Motor  Motor Motor: Abnormal postural alignment and control;Other (comment) Motor - Skilled Clinical Observations: occasional R knee  instability, but states decreased from previous PT rx.   Trunk/Postural Assessment  Cervical Assessment Cervical Assessment: Exceptions to [facility] (forward head.) Thoracic Assessment Thoracic Assessment: Exceptions to [facility] (rounded shoulders.) Lumbar Assessment Lumbar Assessment: Within Functional Limits Postural Control Postural Control: Deficits on evaluation Righting Reactions: Delayed Protective Responses: Delayed  Balance Balance Balance Assessed: Yes Static Sitting Balance Static Sitting - Balance Support: Feet supported Static Sitting - Level of Assistance: 5: Stand by assistance Dynamic Sitting Balance Dynamic Sitting - Balance Support: Feet supported  Dynamic Sitting - Level of Assistance: 5: Stand by assistance Dynamic Sitting - Balance Activities: Lateral lean/weight shifting;Reaching for objects Static Standing Balance Static Standing - Balance Support: Bilateral upper extremity supported Static Standing - Level of Assistance: 4: Min assist;5: Stand by assistance Dynamic Standing Balance Dynamic Standing - Balance Support: Bilateral upper extremity supported Dynamic Standing - Level of Assistance: 4: Min assist Dynamic Standing - Balance Activities: Lateral lean/weight shifting;Reaching for objects Extremity Assessment      RLE Assessment RLE Assessment: Within Functional Limits LLE Assessment LLE Assessment: Within Functional Limits  Care Tool Care Tool Bed Mobility Roll left and right activity        Sit to lying activity   Sit to lying assist level: Minimal Assistance - Patient > 75%    Lying to sitting on side of bed activity   Lying to sitting on side of bed assist level: the ability to move from lying on the back to sitting on the side of the bed with no back support.: Minimal Assistance - Patient > 75%     Care Tool Transfers Sit to stand transfer    Min A    Chair/bed transfer    Min A    Car transfer    Min A      Care Tool  Locomotion Ambulation   Assist level: Minimal Assistance - Patient > 75% Assistive device: Walker-rolling Max distance: 80  Walk 10 feet activity   Assist level: Minimal Assistance - Patient > 75% Assistive device: Walker-rolling   Walk 50 feet with 2 turns activity   Assist level: Minimal Assistance - Patient > 75% Assistive device: Walker-rolling  Walk 150 feet activity    Safety medical concerns    Walk 10 feet on uneven surfaces activity        Stairs   Assist level: Minimal Assistance - Patient > 75% Stairs assistive device: 2 hand rails Max number of stairs: 8  Walk up/down 1 step activity   Walk up/down 1 step (curb) assist level: Minimal Assistance - Patient > 75% Walk up/down 1 step or curb assistive device: 2 hand rails  Walk up/down 4 steps activity   Walk up/down 4 steps assist level: Minimal Assistance - Patient > 75% Walk up/down 4 steps assistive device: 2 hand rails  Walk up/down 12 steps activity Walk up/down 12 steps activity did not occur: Safety/medical concerns      Pick up small objects from floor Pick up small object from the floor (from standing position) activity did not occur: Safety/medical concerns      Wheelchair    supervision        Wheel 50 feet with 2 turns activity  Supervision 100'    Wheel 150 feet activity    Min A    Refer to Care Plan for Long Term Goals  SHORT TERM GOAL WEEK 1 PT Short Term Goal 1 (Week 1): STG =LTG 2/2 ELOS  Recommendations for other services: None   Skilled Therapeutic Intervention Evaluation completed (see details above and below) with education on PT POC and goals and individual treatment initiated with focus on  strengthening, gait, transfers, endurance, pain management, pt ed.    First session:  Eval completed.  Pt presents supine in bed and agreeable to therapy although states increased pain today.  Pt rolls to R and transfers sidelying to sit w/ min A and cues for log roll technique.Pt transfers  sit to stand w/ min A and impulsively pushes FA on  arm rest of RW.  Pt educated on hand placement for safety.  Pt amb to BR for continent void of bladder standing, charted in Flowsheets.  Pt amb to w/c at sink for supervised bathing of perineal area per request before donning underwear and pants over feet in sitting.  Pt required min to mod A to pull over hips.  Pt able to doff pull-over shirt w/ CGA and then don w/ cues and PT assist to complete pulling down.  Pt received pain meds during session.  Pt wheeled x 100' w/ BUEs and BLEs to small gym.  Pt amb x 30' to simulated car transfer w/ min A, cues for safety and hand placement.  Pt negotiated ramp w/ RW and min A, cues for safe turns and returned to room.  Pt performed SPT w/ min A w/c > bed and then log roll to right sidelying w/ min A. Bed alarm on and all needs in reach.  Second session:  Pt presents supine in bed and agreeable to therapy.  Pt transfers sup to sit via log roll w/ min A.  Pt transfers sit to stand and then amb into BR for continent void of bladder, min A for assist w/ clothing management.  Pt amb to w/c and wheeled to main gym for time conservation.  Pt negotiated 8 steps w/ b rails and min A, cues for sequencing and step-to gait pattern.  Pt states dizziness but able to return to w/c, BP noted at 133/87 once seated.  Pt amb x 80' w/ RW and min A, BP 146/84 immediately after gait.  Pt states dizziness, but also "falling asleep" if not talking.  Pt returned to room and performed SPT w/ RW to bed, min A for LES sit to supine.  Bed alarm on and all needs in reach.   Mobility Bed Mobility Bed Mobility: Rolling Right;Rolling Left;Right Sidelying to Sit;Sit to Sidelying Left Rolling Right: Minimal Assistance - Patient > 75% Rolling Left: Minimal Assistance - Patient > 75% Right Sidelying to Sit: Minimal Assistance - Patient > 75% Sit to Sidelying Left: Minimal Assistance - Patient > 75% Transfers Transfers: Sit to Stand;Stand to  Sit;Stand Pivot Transfers Sit to Stand: Minimal Assistance - Patient > 75% Stand to Sit: Minimal Assistance - Patient > 75% (cues for hand placement.) Stand Pivot Transfers: Minimal Assistance - Patient > 75% Stand Pivot Transfer Details: Verbal cues for precautions/safety;Verbal cues for safe use of DME/AE Transfer (Assistive device): None Locomotion  Gait Ambulation: Yes Gait Assistance: Minimal Assistance - Patient > 75% Gait Distance (Feet): 30 Feet Assistive device: Rolling walker Gait Assistance Details: Verbal cues for technique;Verbal cues for safe use of DME/AE Gait Assistance Details: for safe management of RW. Gait Gait: Yes Gait Pattern: Step-through pattern Gait velocity: decreased Stairs / Additional Locomotion Stairs: Yes Stairs Assistance: Minimal Assistance - Patient > 75% Stair Management Technique: Two rails Number of Stairs: 8 Height of Stairs: 6 Curb: Minimal Assistance - Patient >75% Wheelchair Mobility Wheelchair Mobility: Yes Wheelchair Assistance: Doctor, general practice: Both upper extremities;Both lower extermities Wheelchair Parts Management: Needs assistance Distance: 100   Discharge Criteria: Patient will be discharged from PT if patient refuses treatment 3 consecutive times without medical reason, if treatment goals not met, if there is a change in medical status, if patient makes no progress towards goals or if patient is discharged from hospital.  The above assessment, treatment plan, treatment alternatives and goals were discussed and mutually agreed upon: by patient  Tinnie Gens  P Syana Degraffenreid 05/04/2023, 3:42 PM

## 2023-05-05 DIAGNOSIS — D62 Acute posthemorrhagic anemia: Secondary | ICD-10-CM | POA: Diagnosis not present

## 2023-05-05 DIAGNOSIS — G959 Disease of spinal cord, unspecified: Secondary | ICD-10-CM | POA: Diagnosis not present

## 2023-05-05 DIAGNOSIS — R739 Hyperglycemia, unspecified: Secondary | ICD-10-CM | POA: Diagnosis not present

## 2023-05-05 DIAGNOSIS — D72829 Elevated white blood cell count, unspecified: Secondary | ICD-10-CM | POA: Diagnosis not present

## 2023-05-05 LAB — URINALYSIS, ROUTINE W REFLEX MICROSCOPIC
Bilirubin Urine: NEGATIVE
Glucose, UA: NEGATIVE mg/dL
Hgb urine dipstick: NEGATIVE
Ketones, ur: NEGATIVE mg/dL
Leukocytes,Ua: NEGATIVE
Nitrite: NEGATIVE
Protein, ur: NEGATIVE mg/dL
Specific Gravity, Urine: 1.011 (ref 1.005–1.030)
pH: 7 (ref 5.0–8.0)

## 2023-05-05 LAB — GLUCOSE, CAPILLARY
Glucose-Capillary: 108 mg/dL — ABNORMAL HIGH (ref 70–99)
Glucose-Capillary: 94 mg/dL (ref 70–99)
Glucose-Capillary: 101 mg/dL — ABNORMAL HIGH (ref 70–99)
Glucose-Capillary: 125 mg/dL — ABNORMAL HIGH (ref 70–99)

## 2023-05-05 MED ORDER — CYCLOBENZAPRINE HCL 5 MG PO TABS
10.0000 mg | ORAL_TABLET | Freq: Three times a day (TID) | ORAL | Status: DC | PRN
  Administered 2023-05-05 – 2023-05-08 (×5): 10 mg via ORAL
  Filled 2023-05-05 (×7): qty 2

## 2023-05-05 MED ORDER — SORBITOL 70 % SOLN
60.0000 mL | Freq: Once | Status: AC
  Administered 2023-05-05: 60 mL via ORAL
  Filled 2023-05-05: qty 60

## 2023-05-05 MED ORDER — MELATONIN 5 MG PO TABS
5.0000 mg | ORAL_TABLET | Freq: Every evening | ORAL | Status: DC | PRN
  Administered 2023-05-06 – 2023-05-09 (×3): 5 mg via ORAL
  Filled 2023-05-05 (×4): qty 1

## 2023-05-05 NOTE — Progress Notes (Signed)
 PROGRESS NOTE   Subjective/Complaints:  Pt doing well today. States he slept poorly overnight, agreeable with melatonin. Pain well managed overall. Still no BM, but likes the idea of trying more sorbitol today.  Urinating fine.  Wants to change robaxin to flexeril because he thinks robaxin gives him headaches.  Denies any other complaints or concerns today.   ROS: as per HPI. Denies CP, SOB, abd pain, N/V/D, or any other complaints at this time.     Objective:   VAS [place] LOWER EXTREMITY VENOUS (DVT) Result Date: 05/04/2023  Lower Venous DVT Study Patient Name:  Caleb Wall  Date of Exam:   05/04/2023 Medical Rec #: [id number]        Accession #:    [id number] Date of Birth: [birth year]         Patient Gender: M Patient Age:   68 years Exam Location:  [facility] Procedure:      VAS [place] LOWER EXTREMITY VENOUS (DVT) Referring Phys: Mariam Dollar --------------------------------------------------------------------------------  Indications: Swelling.  Risk Factors: None identified. Comparison Study: No prior studies. Performing Technologist: Chanda Busing RVT  Examination Guidelines: A complete evaluation includes B-mode imaging, spectral Doppler, color Doppler, and power Doppler as needed of all accessible portions of each vessel. Bilateral testing is considered an integral part of a complete examination. Limited examinations for reoccurring indications may be performed as noted. The reflux portion of the exam is performed with the patient in reverse Trendelenburg.  +---------+---------------+---------+-----------+----------+--------------+ RIGHT    CompressibilityPhasicitySpontaneityPropertiesThrombus Aging +---------+---------------+---------+-----------+----------+--------------+ CFV      Full           Yes      Yes                                 +---------+---------------+---------+-----------+----------+--------------+  SFJ      Full                                                        +---------+---------------+---------+-----------+----------+--------------+ FV Prox  Full                                                        +---------+---------------+---------+-----------+----------+--------------+ FV Mid   Full                                                        +---------+---------------+---------+-----------+----------+--------------+ FV DistalFull                                                        +---------+---------------+---------+-----------+----------+--------------+  PFV      Full                                                        +---------+---------------+---------+-----------+----------+--------------+ POP      Full           Yes      Yes                                 +---------+---------------+---------+-----------+----------+--------------+ PTV      Full                                                        +---------+---------------+---------+-----------+----------+--------------+ PERO     Full                                                        +---------+---------------+---------+-----------+----------+--------------+   +---------+---------------+---------+-----------+----------+--------------+ LEFT     CompressibilityPhasicitySpontaneityPropertiesThrombus Aging +---------+---------------+---------+-----------+----------+--------------+ CFV      Full           Yes      Yes                                 +---------+---------------+---------+-----------+----------+--------------+ SFJ      Full                                                        +---------+---------------+---------+-----------+----------+--------------+ FV Prox  Full                                                        +---------+---------------+---------+-----------+----------+--------------+ FV Mid   Full                                                         +---------+---------------+---------+-----------+----------+--------------+ FV DistalFull                                                        +---------+---------------+---------+-----------+----------+--------------+ PFV      Full                                                        +---------+---------------+---------+-----------+----------+--------------+  POP      Full           Yes      Yes                                 +---------+---------------+---------+-----------+----------+--------------+ PTV      Full                                                        +---------+---------------+---------+-----------+----------+--------------+ PERO     Full                                                        +---------+---------------+---------+-----------+----------+--------------+     Summary: RIGHT: - There is no evidence of deep vein thrombosis in the lower extremity.  - No cystic structure found in the popliteal fossa.  LEFT: - There is no evidence of deep vein thrombosis in the lower extremity.  - No cystic structure found in the popliteal fossa.  *See table(s) above for measurements and observations. Electronically signed by Heath Lark on 05/04/2023 at 2:26:14 PM.    Final    Recent Labs    05/03/23 0835 05/04/23 0544  WBC 11.4* 13.1*  HGB 9.3* 9.0*  HCT 27.2* 27.0*  PLT 171 199   No results for input(s): "NA", "K", "CL", "CO2", "GLUCOSE", "BUN", "CREATININE", "CALCIUM" in the last 72 hours.       Intake/Output Summary (Last 24 hours) at 05/05/2023 1154 Last data filed at 05/05/2023 0545 Gross per 24 hour  Intake 580 ml  Output 1450 ml  Net -870 ml        Physical Exam: Vital Signs Blood pressure (!) 141/89, pulse 89, temperature 99.8 F (37.7 C), temperature source Oral, resp. rate 18, height 5\' 7"  (1.702 m), SpO2 97%.  Constitutional: No distress . Vital signs reviewed. Resting comfortably in bed.  HEENT:  NCAT, EOMI, oral membranes a little dry Neck: supple, anterior wound dressing in place, posterior neck dressing with minimal blood on honeycomb, well adhered, tubing dressing c/d/i Cardiovascular: RRR without m/r/g appreciated. No JVD    Respiratory/Chest: CTA Bilaterally without wheezes or rales. Normal effort    GI/Abdomen: soft, +BS throughout, non-tender, non-distended Ext: no clubbing, cyanosis, or edema Psych: more jovial, cooperative Skin: dry, warm MSK: able to move all extremities antigravity  PRIOR EXAMS: Neurological:     Mental Status: He is alert.     Comments: Alert and oriented x 3. Normal insight and awareness. Intact Memory. Normal language and speech. Cranial nerve exam unremarkable. MMT:  UE grossly 4/5 prox to distal, perhaps a bit of HI weakness. BLE 4/5 prox to distal. Decreased LT in finger tips of both hands. DTR's 1+. No abnl resting tone.   Assessment/Plan: 1. Functional deficits which require 3+ hours per day of interdisciplinary therapy in a comprehensive inpatient rehab setting. Physiatrist is providing close team supervision and 24 hour management of active medical problems listed below. Physiatrist and rehab team continue to assess barriers to discharge/monitor patient progress toward functional and medical goals  Care Tool:  Bathing    Body parts  bathed by patient: Right arm, Left arm, Chest, Abdomen, Front perineal area, Right upper leg, Left upper leg, Face   Body parts bathed by helper: Buttocks, Right lower leg, Left lower leg     Bathing assist Assist Level: Moderate Assistance - Patient 50 - 74%     Upper Body Dressing/Undressing Upper body dressing   What is the patient wearing?: Pull over shirt    Upper body assist Assist Level: Moderate Assistance - Patient 50 - 74%    Lower Body Dressing/Undressing Lower body dressing      What is the patient wearing?: Underwear/pull up, Pants     Lower body assist Assist for lower body  dressing: Maximal Assistance - Patient 25 - 49%     Toileting Toileting    Toileting assist Assist for toileting: Minimal Assistance - Patient > 75%     Transfers Chair/bed transfer  Transfers assist     Chair/bed transfer assist level: Minimal Assistance - Patient > 75%     Locomotion Ambulation   Ambulation assist      Assist level: Minimal Assistance - Patient > 75% Assistive device: Walker-rolling Max distance: 80   Walk 10 feet activity   Assist     Assist level: Minimal Assistance - Patient > 75% Assistive device: Walker-rolling   Walk 50 feet activity   Assist Walk 50 feet with 2 turns activity did not occur: Safety/medical concerns  Assist level: Minimal Assistance - Patient > 75% Assistive device: Walker-rolling    Walk 150 feet activity   Assist Walk 150 feet activity did not occur: Safety/medical concerns         Walk 10 feet on uneven surface  activity   Assist     Assist level: Minimal Assistance - Patient > 75% (ramp) Assistive device: Walker-rolling   Wheelchair     Assist Is the patient using a wheelchair?: Yes Type of Wheelchair: Manual    Wheelchair assist level: Supervision/Verbal cueing Max wheelchair distance: 100    Wheelchair 50 feet with 2 turns activity    Assist        Assist Level: Supervision/Verbal cueing   Wheelchair 150 feet activity     Assist      Assist Level: Moderate Assistance - Patient 50 - 74%   Blood pressure (!) 141/89, pulse 89, temperature 99.8 F (37.7 C), temperature source Oral, resp. rate 18, height 5\' 7"  (1.702 m), SpO2 97%.  Medical Problem List and Plan: 1. Functional deficits secondary to cervical stenosis/myelopathy status post C6-7 ACDF and C2-4 laminectomies, C2-T2 PSF -has history of C3-C6 ACDF 12/26/2020. -NO BRACE REQUIRED.Marland Kitchen   -patient may not shower             -ELOS/Goals: 14-16 days, supervision to min assist goals with PT, OT    -Continue CIR 2.   Antithrombotics: -DVT/anticoagulation:  Pharmaceutical: Lovenox 40mg  daily             -antiplatelet therapy: N/A  -05/04/23 vascular U/S neg for DVTs 3. Pain Management: Neurontin 300 mg 3 times daily; Tylenol, Robaxin, and oxycodone as needed -05/05/23 pt requesting robaxin be changed to flexeril-- ordered 10mg  TID PRN 4. Mood/Behavior/Sleep: Provide emotional support             -antipsychotic agents: N/A -05/04/23 didn't sleep well but perseverates on other issues so doesn't really tell me if he wants anything for this; monitor for now -05/05/23 poor sleep again, agreeable with trying melatonin 5mg  PRN 5. Neuropsych/cognition: This patient is capable of  making decisions on his own behalf. 6. Skin/Wound Care: Routine skin checks -continue posterior surgical site drain until output is less than 100 cc over 24 hours or less than 50 cc over 12 hours.  -drain became disconnected during transfer from [facility]. Tubing was reconnected and I recharged the hemovac container. Observe tonight for functionality 7. Fluids/Electrolytes/Nutrition: Routine I&O's with follow-up chemistries -05/04/23 pt very adamant that he be on regular diet; on carb mod, explained rationale behind this. Pt agreeable in the end.  8.  BPH.  Uroxatrol 10 mg daily, Cialis 2.5 mg daily (not restarted?), continue prophylactic Macrobid 100 mg daily 9.  Constipation.  Senokot 1 tablet twice daily, Colace 100 mg twice daily ---> change to senokot-s 2 qhs             -LBM 3/11 -pt says that he feels like he needs to empty. Will have dulcolax suppository available as needed. Sorbitol prn also.  -05/04/23 no BM since Monday, will start miralax BID, colace BID, continue senokot 2 tabs nightly, and use sorbitol 30ml once now.  -05/05/23 still no BM, wants to try sorbitol 60ml one more time today; cont other meds; monitor closely 10.  Diabetes mellitus.  Latest hemoglobin A1c 6.1, recheck Monday.  Patient on Mounjaro 10 mg weekly prior to admission.  SSI, CBG checks.  -pt states that family will bring it in. -05/04/23 pt upset that he's on SSI, explained why; waiting on mounjaro to be brought in as well; for now continue SSI; CBGs are ok.  -05/05/23 mounjaro in for Friday 3/21; CBGs look great; monitor CBG (last 3)  Recent Labs    05/04/23 2050 05/05/23 0556 05/05/23 1116  GLUCAP 138* 108* 94    11.  Primary open-angle glaucoma left eye.  Continue eyedrops 12. ABLA: Hgb 9.0 on 05/04/23, relatively stable; monitor 13. Leukocytosis:  -05/04/23 was 11.4 on 3/14, up to 13.1 on 3/15, but did take decadron recently... monitor on Monday, no s/sx of infection at this point 14. GERD: continue protonix 40mg  daily   I spent >74mins performing patient care related activities, including prolonged face to face time, documentation time, med management/changes, discussion of this with pt, and overall coordination of care.    LOS: 2 days A FACE TO FACE EVALUATION WAS PERFORMED  [address] 05/05/2023, 11:54 AM

## 2023-05-05 NOTE — Discharge Summary (Signed)
 Physician Discharge Summary  Patient ID: Caleb Wall MRN: [id number] DOB/AGE: [birth year] 66 y.o.  Admit date: 05/03/2023 Discharge date: 05/10/2023  Discharge Diagnoses:  Principal Problem:   Cervical myelopathy (HCC) DVT prophylaxis BPH Diabetes mellitus Constipation Primary open-angle glaucoma of left eye History hepatitis C Acute blood loss anemia  Discharged Condition: Stable  Significant Diagnostic Studies: VAS [place] LOWER EXTREMITY VENOUS (DVT) Result Date: 05/04/2023  Lower Venous DVT Study Patient Name:  Caleb Wall  Date of Exam:   05/04/2023 Medical Rec #: [id number]        Accession #:    [id number] Date of Birth: [birth year]         Patient Gender: M Patient Age:   68 years Exam Location:  [facility] Procedure:      VAS [place] LOWER EXTREMITY VENOUS (DVT) Referring Phys: Mariam Dollar --------------------------------------------------------------------------------  Indications: Swelling.  Risk Factors: None identified. Comparison Study: No prior studies. Performing Technologist: Chanda Busing RVT  Examination Guidelines: A complete evaluation includes B-mode imaging, spectral Doppler, color Doppler, and power Doppler as needed of all accessible portions of each vessel. Bilateral testing is considered an integral part of a complete examination. Limited examinations for reoccurring indications may be performed as noted. The reflux portion of the exam is performed with the patient in reverse Trendelenburg.  +---------+---------------+---------+-----------+----------+--------------+ RIGHT    CompressibilityPhasicitySpontaneityPropertiesThrombus Aging +---------+---------------+---------+-----------+----------+--------------+ CFV      Full           Yes      Yes                                 +---------+---------------+---------+-----------+----------+--------------+ SFJ      Full                                                         +---------+---------------+---------+-----------+----------+--------------+ FV Prox  Full                                                        +---------+---------------+---------+-----------+----------+--------------+ FV Mid   Full                                                        +---------+---------------+---------+-----------+----------+--------------+ FV DistalFull                                                        +---------+---------------+---------+-----------+----------+--------------+ PFV      Full                                                        +---------+---------------+---------+-----------+----------+--------------+ POP  Full           Yes      Yes                                 +---------+---------------+---------+-----------+----------+--------------+ PTV      Full                                                        +---------+---------------+---------+-----------+----------+--------------+ PERO     Full                                                        +---------+---------------+---------+-----------+----------+--------------+   +---------+---------------+---------+-----------+----------+--------------+ LEFT     CompressibilityPhasicitySpontaneityPropertiesThrombus Aging +---------+---------------+---------+-----------+----------+--------------+ CFV      Full           Yes      Yes                                 +---------+---------------+---------+-----------+----------+--------------+ SFJ      Full                                                        +---------+---------------+---------+-----------+----------+--------------+ FV Prox  Full                                                        +---------+---------------+---------+-----------+----------+--------------+ FV Mid   Full                                                         +---------+---------------+---------+-----------+----------+--------------+ FV DistalFull                                                        +---------+---------------+---------+-----------+----------+--------------+ PFV      Full                                                        +---------+---------------+---------+-----------+----------+--------------+ POP      Full           Yes      Yes                                 +---------+---------------+---------+-----------+----------+--------------+  PTV      Full                                                        +---------+---------------+---------+-----------+----------+--------------+ PERO     Full                                                        +---------+---------------+---------+-----------+----------+--------------+     Summary: RIGHT: - There is no evidence of deep vein thrombosis in the lower extremity.  - No cystic structure found in the popliteal fossa.  LEFT: - There is no evidence of deep vein thrombosis in the lower extremity.  - No cystic structure found in the popliteal fossa.  *See table(s) above for measurements and observations. Electronically signed by Heath Lark on 05/04/2023 at 2:26:14 PM.    Final    DG Chest 2 View Result Date: 05/03/2023 CLINICAL DATA:  Fever EXAM: CHEST - 2 VIEW COMPARISON:  04/20/2014 FINDINGS: The heart size and mediastinal contours are within normal limits. Minimal linear atelectasis at the left lung base. Lungs are otherwise clear. No pneumothorax. Partially imaged anterior and posterior cervical fusion hardware. Surgical staples overlie the lower neck and upper back posteriorly. A surgical drain is also present at this location. IMPRESSION: Minimal linear atelectasis at the left lung base. Lungs are otherwise clear. Electronically Signed   By: Duanne Guess D.O.   On: 05/03/2023 12:06   DG Cervical Spine 2-3 Views Result Date: 04/30/2023 CLINICAL DATA:   Elective surgery. EXAM: CERVICAL SPINE - 2-3 VIEW COMPARISON:  Radiograph 04/03/2023 FINDINGS: Eight fluoroscopic spot views of the cervical spine submitted from the operating room. There has been previous anterior C3 through C6 fusion. Interval posterior fusion extending from C2 through the upper thoracic spine. Fluoroscopy time 46.2 seconds. Dose 22.57 mGy. IMPRESSION: Intraoperative fluoroscopy during cervical fusion. Electronically Signed   By: Narda Rutherford M.D.   On: 04/30/2023 20:23   DG C-Arm 1-60 Min-No Report Result Date: 04/30/2023 Fluoroscopy was utilized by the requesting physician.  No radiographic interpretation.   DG C-Arm 1-60 Min-No Report Result Date: 04/30/2023 Fluoroscopy was utilized by the requesting physician.  No radiographic interpretation.   DG C-Arm 1-60 Min-No Report Result Date: 04/30/2023 Fluoroscopy was utilized by the requesting physician.  No radiographic interpretation.   DG C-Arm 1-60 Min-No Report Result Date: 04/30/2023 Fluoroscopy was utilized by the requesting physician.  No radiographic interpretation.   DG C-Arm 1-60 Min-No Report Result Date: 04/30/2023 Fluoroscopy was utilized by the requesting physician.  No radiographic interpretation.   DG C-Arm 1-60 Min-No Report Result Date: 04/30/2023 Fluoroscopy was utilized by the requesting physician.  No radiographic interpretation.   DG C-Arm 1-60 Min-No Report Result Date: 04/30/2023 Fluoroscopy was utilized by the requesting physician.  No radiographic interpretation.   DG C-Arm 1-60 Min-No Report Result Date: 04/30/2023 Fluoroscopy was utilized by the requesting physician.  No radiographic interpretation.    Labs:  Basic Metabolic Panel: Recent Labs  Lab 05/06/23 0439  NA 131*  K 3.8  CL 101  CO2 25  GLUCOSE 103*  BUN 11  CREATININE 0.91  CALCIUM 8.4*    CBC: Recent  Labs  Lab 05/03/23 0835 05/04/23 0544 05/06/23 0439  WBC 11.4* 13.1* 10.3  NEUTROABS  --   --  5.6  HGB 9.3*  9.0* 8.8*  HCT 27.2* 27.0* 26.2*  MCV 85.5 85.7 85.6  PLT 171 199 233    CBG: Recent Labs  Lab 05/06/23 2111 05/07/23 0651 05/07/23 1140 05/07/23 1640 05/07/23 1703  GLUCAP 103* 123* 105* 67* 103*   Family history.  Mother with heart disease as well as hypertension.  Maternal aunt with diabetes.  Denies any colon cancer esophageal cancer or rectal cancer  Brief HPI:   Paris Chiriboga is a 68 y.o. right-handed male with history significant for BPH, diabetes mellitus, hepatitis C, hypertension hyperlipidemia, left THA 03/06/2020, quit smoking 3 years ago, ACDF 12/26/2020.  Per chart review patient lives with spouse.  Independent with occasional cane prior to admission.  Presented to [facility] 04/30/2023 with progressive neck pain, weakness/numbness tingling and loss of hand function especially over the last few months to 1 year as well as weakness right lower extremity and right upper extremity.  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  Underwent anterior cervical discectomy with fusion C6-7 placement of interbody device with posterior segmental instrumentation C2-T2 posterior cervical thoracic arthrodesis C2 to-T2 cervical laminectomy C2-C4 04/30/2023 per Dr. Ernestine Mcmurray.  Anterior drain removed 3/13 await plan for removal of posterior drain.  Patient did complete Decadron taper.  Placed on Lovenox for DVT prophylaxis 05/01/2023.  Therapy evaluations completed due to patient decreased functional mobility was admitted for a comprehensive rehab program.   Hospital Course: Xai Frerking was admitted to rehab 05/03/2023 for inpatient therapies to consist of PT, ST and OT at least three hours five days a week. Past admission physiatrist, therapy team and rehab RN have worked together to provide customized collaborative inpatient rehab.  Pertaining to patient's cervical stenosis/myelopathy status post ACDF C6-7 and C2-4 laminectomies  C2-T2 PSF.  No brace required.  Follow-up neurosurgery.  Posterior drain remained in place until volumes less than 100 for 24 hours or less than 50 for 12 hours and removed 3/18.  Venous Doppler studies negative maintained on Lovenox for DVT prophylaxis.  Pain management with the use of Neurontin titrated as needed with Flexeril and oxycodone as needed.  History of BPH maintained on Uroxatrol as well as Cialis as well as prophylactic Macrobid prior to admission.  Bouts of constipation resolved with laxative assistance.  Blood sugars managed with sliding scale insulin patient on Mounjaro prior to admission latest hemoglobin A1c 6.1.  Open-angle glaucoma left eye eyedrops as dictated.  Acute blood loss anemia latest hemoglobin 9.0 monitoring for any bleeding episodes.   Blood pressures were monitored on TID basis and controlled and monitored  Diabetes has been monitored with ac/hs CBG checks and SSI was use prn for tighter BS control.    Rehab course: During patient's stay in rehab weekly team conferences were held to monitor patient's progress, set goals and discuss barriers to discharge. At admission, patient required min mod assist 30 feet rolling walker minimal assist sit to stand  Physical exam.  Blood pressure 167/80 pulse 78 temperature 98.2 respirations 15 oxygen saturation is 99% room air Constitutional.  No acute distress HEENT Head.  Normocephalic and atraumatic Eyes.  Pupils round and reactive to light no discharge without nystagmus Neck.  Surgical site clean and dry no JVD Cardiac regular rate and rhythm without any extra sounds or murmur heard  Abdomen.  Soft nontender positive bowel sounds without rebound Respiratory effort normal no respiratory distress without wheeze Neurologic.  Alert oriented x 3 fair insight and awareness.  Manual muscle testing upper extremities 4/5 proximal to distal perhaps a bit of HI weakness.  Bilateral lower extremities 4/5 proximal to distal.  Decreased  light touch in fingers of both hands  He/She  has had improvement in activity tolerance, balance, postural control as well as ability to compensate for deficits. He/She has had improvement in functional use RUE/LUE  and RLE/LLE as well as improvement in awareness.  Working with energy conservation techniques.  Ambulates 93 feet x 3 rolling walker with cues for increased right hip flexion.  Contact-guard for steps with 2 handrails.  Contact-guard overall for lower body dressing standby assist for grooming and standby assist bed mobility.  Contact-guard for bathing and transfers.  Full family teaching completed plan discharge to home       Disposition:  There are no questions and answers to display.         Diet: Diabetic diet  Special Instructions: No driving smoking or alcohol  Medications at discharge. 1.  Tylenol as needed 2.  Uroxatrol 10 mg p.o. daily 3.  Flexeril 10 mg p.o. 3 times daily as needed muscle spasms 4.  Colace 100 mg p.o. twice daily 5.  Neurontin 300 mg p.o. 3 times daily 6.  Macrobid 100 mg p.o. daily 7.  Oxycodone 10 mg every 4 hours as needed pain 8.  Protonix 40 mg p.o. daily 9.  MiraLAX daily hold for loose stools 10.  Mounjaro 10 mg weekly 11.  Cialis 5 mg 3 times a week 12.  Alphagan ophthalmic solution 0.2% 1 drop both eyes twice daily 13.  Cosopt ophthalmic solution 22.3-6.8 mg 1 drop both eyes twice daily 14.  Xalatan 0.005% solution 1 drop both eyes bedtime  30-35 minutes were spent completing discharge summary and discharge planning Discharge Instructions     Ambulatory referral to Physical Medicine Rehab   Complete by: As directed    Moderate complexity follow up 1-2 weeks cervical myelopathy        Follow-up Information     Lovorn, Aundra Millet, MD Follow up.   Specialty: Physical Medicine and Rehabilitation Why: Office to call for appointment Contact information: [address] [place] [address] [phone]          Lovenia Kim, MD Follow up.   Specialty: Neurosurgery Why: Call for appointment Contact information: [address] [place] [address] [phone]                 Signed: Charlton Amor 05/08/2023, 5:38 AM

## 2023-05-05 NOTE — Discharge Instructions (Signed)
 Inpatient Rehab Discharge Instructions  Caleb Wall Discharge date and time: No discharge date for patient encounter.   Activities/Precautions/ Functional Status: Activity: As tolerated Diet: Diabetic diet Wound Care: Routine skin checks Functional status:  ___ No restrictions     ___ Walk up steps independently ___ 24/7 supervision/assistance   ___ Walk up steps with assistance ___ Intermittent supervision/assistance  ___ Bathe/dress independently ___ Walk with walker     _x__ Bathe/dress with assistance ___ Walk Independently    ___ Shower independently ___ Walk with assistance    ___ Shower with assistance ___ No alcohol     ___ Return to work/school ________  Special Instructions: No driving smoking or alcohol   My questions have been answered and I understand these instructions. I will adhere to these goals and the provided educational materials after my discharge from the hospital.  Patient/Caregiver Signature _______________________________ Date __________  Clinician Signature _______________________________________ Date __________  Please bring this form and your medication list with you to all your follow-up doctor's appointments.

## 2023-05-05 NOTE — Progress Notes (Signed)
 Physical Therapy Session Note  Patient Details  Name: Caleb Wall MRN: [id number] Date of Birth: 19-Aug-1955  Today's Date: 05/05/2023 PT Individual Time: 1500-1536 PT Individual Time Calculation (min): 36 min  and Today's Date: 05/05/2023 PT Missed Time: 9 Minutes Missed Time Reason: Other (Comment) (eating)  Short Term Goals: Week 1:  PT Short Term Goal 1 (Week 1): STG =LTG 2/2 ELOS  Skilled Therapeutic Interventions/Progress Updates:      Therapy Documentation Precautions:  Precautions Precautions: Cervical, Other (comment) (hemovac drain.) Precaution/Restrictions Comments: No cervical brace ordered. Restrictions Weight Bearing Restrictions Per Provider Order: No General: PT Amount of Missed Time (min): 9 Minutes PT Missed Treatment Reason: Other (Comment) (eating)  Pt received bedside eating meal and reports recently received pain medication, pt requested additional time to eat and missed 9 minutes of skilled PT. Pt agreeable to PT on return and requested to work on bed mobility. CGA/supervision with w/c propulsion ~50' with Bilateral LE's to ortho gym. Min A with stand pivot no AD from w/c<>mat. PT educated pt on log roll technique and cervical precautions and pt able to demonstrate supine<>sit with appropriate technique x 6. Pt returned to bed, left semi-reclined with all needs in reach and alarm. Pt without verbal reports of pain.     Therapy/Group: Individual Therapy  [name] [name] PT, DPT  05/05/2023, 4:09 PM

## 2023-05-06 DIAGNOSIS — G959 Disease of spinal cord, unspecified: Secondary | ICD-10-CM | POA: Diagnosis not present

## 2023-05-06 LAB — CBC WITH DIFFERENTIAL/PLATELET
Abs Immature Granulocytes: 0.05 10*3/uL (ref 0.00–0.07)
Basophils Absolute: 0.1 10*3/uL (ref 0.0–0.1)
Basophils Relative: 1 %
Eosinophils Absolute: 0.5 10*3/uL (ref 0.0–0.5)
Eosinophils Relative: 5 %
HCT: 26.2 % — ABNORMAL LOW (ref 39.0–52.0)
Hemoglobin: 8.8 g/dL — ABNORMAL LOW (ref 13.0–17.0)
Immature Granulocytes: 1 %
Lymphocytes Relative: 29 %
Lymphs Abs: 3 10*3/uL (ref 0.7–4.0)
MCH: 28.8 pg (ref 26.0–34.0)
MCHC: 33.6 g/dL (ref 30.0–36.0)
MCV: 85.6 fL (ref 80.0–100.0)
Monocytes Absolute: 1.2 10*3/uL — ABNORMAL HIGH (ref 0.1–1.0)
Monocytes Relative: 11 %
Neutro Abs: 5.6 10*3/uL (ref 1.7–7.7)
Neutrophils Relative %: 53 %
Platelets: 233 10*3/uL (ref 150–400)
RBC: 3.06 MIL/uL — ABNORMAL LOW (ref 4.22–5.81)
RDW: 13.4 % (ref 11.5–15.5)
WBC: 10.3 10*3/uL (ref 4.0–10.5)
nRBC: 0 % (ref 0.0–0.2)

## 2023-05-06 LAB — COMPREHENSIVE METABOLIC PANEL
ALT: 26 U/L (ref 0–44)
AST: 19 U/L (ref 15–41)
Albumin: 2.6 g/dL — ABNORMAL LOW (ref 3.5–5.0)
Alkaline Phosphatase: 41 U/L (ref 38–126)
Anion gap: 5 (ref 5–15)
BUN: 11 mg/dL (ref 8–23)
CO2: 25 mmol/L (ref 22–32)
Calcium: 8.4 mg/dL — ABNORMAL LOW (ref 8.9–10.3)
Chloride: 101 mmol/L (ref 98–111)
Creatinine, Ser: 0.91 mg/dL (ref 0.61–1.24)
GFR, Estimated: >60 mL/min (ref >60)
Glucose, Bld: 103 mg/dL — ABNORMAL HIGH (ref 70–99)
Potassium: 3.8 mmol/L (ref 3.5–5.1)
Sodium: 131 mmol/L — ABNORMAL LOW (ref 135–145)
Total Bilirubin: 0.6 mg/dL (ref 0.0–1.2)
Total Protein: 5.6 g/dL — ABNORMAL LOW (ref 6.5–8.1)

## 2023-05-06 LAB — GLUCOSE, CAPILLARY
Glucose-Capillary: 111 mg/dL — ABNORMAL HIGH (ref 70–99)
Glucose-Capillary: 107 mg/dL — ABNORMAL HIGH (ref 70–99)
Glucose-Capillary: 101 mg/dL — ABNORMAL HIGH (ref 70–99)
Glucose-Capillary: 103 mg/dL — ABNORMAL HIGH (ref 70–99)

## 2023-05-06 LAB — HEMOGLOBIN A1C
Hgb A1c MFr Bld: 5.7 % — ABNORMAL HIGH (ref 4.8–5.6)
Mean Plasma Glucose: 116.89 mg/dL

## 2023-05-06 MED ORDER — TADALAFIL 5 MG PO TABS
5.0000 mg | ORAL_TABLET | ORAL | Status: DC
  Administered 2023-05-06 – 2023-05-10 (×3): 5 mg via ORAL
  Filled 2023-05-06 (×3): qty 1

## 2023-05-06 MED ORDER — SORBITOL 70 % SOLN
60.0000 mL | Freq: Once | Status: AC
  Administered 2023-05-06: 30 mL via ORAL

## 2023-05-06 NOTE — Progress Notes (Signed)
 Horton Chin, MD  Physician Physical Medicine and Rehabilitation   Consult Note    Signed   Date of Service: 05/01/2023  1:48 PM  Related encounter: Admission (Discharged) from 04/30/2023 in [facility] (1A)   Signed     Expand All Collapse All           Physical Medicine and Rehabilitation Consult Reason for Consult: C6-C7 anterior cervical discectomy and fusion and C2-T2 posterior spinal fusion of C2-C4 Referring Physician: Ernestine Mcmurray, MD     HPI: Caleb Wall is a 68 y.o. male who is s/p C6-C7 ACDF and C2-C4 laminectomies and C2-T2 PSIF. Course has been complicated by postoperative pain but wife is concerned about oversedation from pain medication. Patient is currently able to stand for 2 min MaxA x2. Physical Medicine & Rehabilitation was consulted to assess candidacy for CIR.     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     Other secondary kyphosis, cervical region     Prediabetes     Primary open angle glaucoma (POAG) of left eye     Pseudoarthrosis of cervical spine (HCC)     Sepsis secondary to UTI (HCC)     Spondylosis of cervical spine               Past Surgical History:  Procedure Laterality Date   ANTERIOR CERVICAL DECOMPRESSION/DISCECTOMY FUSION 4 LEVELS N/A 12/26/2020    Procedure: C3-6 ANTERIOR CERVICAL DECOMPRESSION/DISCECTOMY FUSION 3 LEVELS;  Surgeon: Lucy Chris, MD;  Location: [facility] ORS;  Service: Neurosurgery;  Laterality: N/A;   BROW LIFT AND BLEPHAROPLASTY Bilateral 12/21/2019   CATARACT EXTRACTION W/ INTRAOCULAR LENS IMPLANT Left 02/05/2019   COLONOSCOPY WITH  PROPOFOL N/A 09/29/2021    Procedure: COLONOSCOPY WITH PROPOFOL;  Surgeon: Regis Bill, MD;  Location: [facility];  Service: Endoscopy;  Laterality: N/A;   KNEE SURGERY Left 2002    Meniscus tear   LITHOTRIPSY   12/13/2020   MASS EXCISION N/A 10/11/2022    Procedure: EXCISION MASS CHEST WALL;  Surgeon: Sung Amabile, DO;  Location: [facility] OR[facility];  Service: General;  Laterality: N/A;   ORIF FEMORAL NECK FRACTURE W/ DHS Left 03/06/2020   SHOULDER ARTHROSCOPY WITH OPEN ROTATOR CUFF REPAIR AND DISTAL CLAVICLE ACROMINECTOMY Left 07/25/2021    Procedure: SHOULDER ARTHROSCOPY WITH OPEN ROTATOR CUFF REPAIR AND DISTAL CLAVICLE ACROMINECTOMY;  Surgeon: Juanell Fairly, MD;  Location: [facility] OR[facility];  Service: Orthopedics;  Laterality: Left;   TOTAL HIP ARTHROPLASTY Left 03/06/2020             Family History  Problem Relation Age of Onset   Heart disease Mother     Hypertension Mother     Diabetes Maternal Aunt     Cancer Maternal Uncle          Prostate   Diabetes Paternal Aunt          Social History:  reports that he quit smoking about 3 years ago. His smoking use included cigarettes. He started smoking  about 43 years ago. He has a 20 pack-year smoking history. He has never used smokeless tobacco. He reports that he does not currently use drugs after having used the following drugs: Marijuana. He reports that he does not drink alcohol. Allergies:  Allergies  No Known Allergies         Medications Prior to Admission  Medication Sig Dispense Refill   alfuzosin (UROXATRAL) 10 MG 24 hr tablet Take 1 tablet (10 mg total) by mouth 3 (three) times a week. (Patient taking differently: Take 10 mg by mouth daily.) 13 tablet 11   Aspirin-Salicylamide-Caffeine (BC HEADACHE POWDER PO) Take 1 packet by mouth daily as needed (pain).       bisacodyl (DULCOLAX) 5 MG EC tablet Take 10 mg by mouth daily as needed for moderate constipation.       brimonidine (ALPHAGAN) 0.2 % ophthalmic solution Place 1 drop  into both eyes 2 (two) times daily.       dorzolamide-timolol (COSOPT) 22.3-6.8 MG/ML ophthalmic solution Place 1 drop into both eyes 2 (two) times daily.       latanoprost (XALATAN) 0.005 % ophthalmic solution Place 1 drop into both eyes at bedtime.       meloxicam (MOBIC) 15 MG tablet Take 15 mg by mouth daily.       MOUNJARO 10 MG/0.5ML Pen Inject 10 mg into the skin once a week.       nitrofurantoin, macrocrystal-monohydrate, (MACROBID) 100 MG capsule Take 100 mg by mouth daily.       tadalafil (CIALIS) 5 MG tablet Take 0.5 tablets (2.5 mg total) by mouth 3 (three) times a week. (Patient taking differently: Take 5 mg by mouth as directed. Patient takes at bedtime Sunday, Wednesday, Friday) 7 tablet 11   traMADol (ULTRAM) 50 MG tablet Take 1 tablet (50 mg total) by mouth every 6 (six) hours as needed. 6 tablet 0   acetaminophen (TYLENOL) 500 MG tablet Take 1,000 mg by mouth every 6 (six) hours as needed.              Home: Home Living Family/patient expects to be discharged to:: Private residence Living Arrangements: Spouse/significant other Available Help at Discharge: Family (Daughter/wife) Type of Home: House Home Access: Stairs to enter Secretary/administrator of Steps: 4 Entrance Stairs-Rails: Can reach both (They are unstable) Home Layout: One level Bathroom Shower/Tub: Tub/shower unit Home Equipment: Grab bars - toilet, Grab bars - tub/shower, Hand held shower head, Shower seat, Cane - single point Additional Comments: a/o x4  Functional History: Prior Function Prior Level of Function : Independent/Modified Independent, History of Falls (last six months), Driving Mobility Comments: Cane PRN when legs feel weak ADLs Comments: Indep Functional Status:  Mobility: Bed Mobility Overal bed mobility: Needs Assistance Bed Mobility: Sidelying to Sit, Rolling, Sit to Sidelying Rolling: Max assist, +2 for physical assistance, Used rails Sidelying to sit: Used rails, Max assist Sit  to sidelying: Max assist, +2 for physical assistance General bed mobility comments: Log rolling technique utilized Transfers Overall transfer level: Needs assistance Equipment used: Rolling walker (2 wheels) Transfers: Sit to/from Stand Sit to Stand: Mod assist, +2 physical assistance, From elevated surface General transfer comment: Pt STS from elevated bed height with MODA+2 with RW. Pt stood for ~45mins before overcome with pain and closed eyes and began sitting back down. MAXA +2 to return pt in bed.   ADL: ADL Overall ADL's : Needs assistance/impaired Eating/Feeding: Minimal assistance, Sitting Grooming: Wash/dry face, Maximal assistance (Wife completed while in  bed) Lower Body Dressing: Maximal assistance, Sit to/from stand Functional mobility during ADLs: Rolling walker (2 wheels), +2 for physical assistance, Minimal assistance (RW; Min A for STS, MAXA to remain standing due to onset nausa, and increased level of pain when standing.) General ADL Comments: MAXA donning briefs in standing while pt holding onto walker, MODA x1 for standing stability.   Cognition: Cognition Orientation Level: Oriented X4 Cognition Arousal: Suspect due to medications Behavior During Therapy: WFL for tasks assessed/performed   Blood pressure 139/81, pulse 90, temperature 98.1 F (36.7 C), temperature source Oral, resp. rate 16, height 5\' 7"  (1.702 m), weight 100.7 kg, SpO2 97%. Physical Exam Gen: no distress, normal appearing HEENT: oral mucosa pink and moist, NCAT Cardio: Reg rate Chest: normal effort, normal rate of breathing Abd: soft, non-distended Ext: no edema Psych: pleasant, normal affect Skin: intact Neuro: Alert and oriented x3. 4/5 strength throughout.    Lab Results Last 24 Hours       Results for orders placed or performed during the hospital encounter of 04/30/23 (from the past 24 hours)  Glucose, capillary     Status: Abnormal    Collection Time: 04/30/23  6:16 PM  Result  Value Ref Range    Glucose-Capillary 143 (H) 70 - 99 mg/dL  Basic metabolic panel     Status: Abnormal    Collection Time: 05/01/23  4:52 AM  Result Value Ref Range    Sodium 135 135 - 145 mmol/L    Potassium 3.6 3.5 - 5.1 mmol/L    Chloride 107 98 - 111 mmol/L    CO2 19 (L) 22 - 32 mmol/L    Glucose, Bld 120 (H) 70 - 99 mg/dL    BUN 17 8 - 23 mg/dL    Creatinine, Ser 2.84 0.61 - 1.24 mg/dL    Calcium 8.2 (L) 8.9 - 10.3 mg/dL    GFR, Estimated >13 >24 mL/min    Anion gap 9 5 - 15       Imaging Results (Last 48 hours)  DG Cervical Spine 2-3 Views Result Date: 04/30/2023 CLINICAL DATA:  Elective surgery. EXAM: CERVICAL SPINE - 2-3 VIEW COMPARISON:  Radiograph 04/03/2023 FINDINGS: Eight fluoroscopic spot views of the cervical spine submitted from the operating room. There has been previous anterior C3 through C6 fusion. Interval posterior fusion extending from C2 through the upper thoracic spine. Fluoroscopy time 46.2 seconds. Dose 22.57 mGy. IMPRESSION: Intraoperative fluoroscopy during cervical fusion. Electronically Signed   By: Narda Rutherford M.D.   On: 04/30/2023 20:23    DG C-Arm 1-60 Min-No Report Result Date: 04/30/2023 Fluoroscopy was utilized by the requesting physician.  No radiographic interpretation.    DG C-Arm 1-60 Min-No Report Result Date: 04/30/2023 Fluoroscopy was utilized by the requesting physician.  No radiographic interpretation.    DG C-Arm 1-60 Min-No Report Result Date: 04/30/2023 Fluoroscopy was utilized by the requesting physician.  No radiographic interpretation.    DG C-Arm 1-60 Min-No Report Result Date: 04/30/2023 Fluoroscopy was utilized by the requesting physician.  No radiographic interpretation.    DG C-Arm 1-60 Min-No Report Result Date: 04/30/2023 Fluoroscopy was utilized by the requesting physician.  No radiographic interpretation.    DG C-Arm 1-60 Min-No Report Result Date: 04/30/2023 Fluoroscopy was utilized by the requesting physician.   No radiographic interpretation.    DG C-Arm 1-60 Min-No Report Result Date: 04/30/2023 Fluoroscopy was utilized by the requesting physician.  No radiographic interpretation.    DG C-Arm 1-60 Min-No Report Result Date: 04/30/2023 Fluoroscopy  was utilized by the requesting physician.  No radiographic interpretation.       Assessment/Plan: Diagnosis: C6-C7 anterior cervical discectomy and fusion, C2-T2 posterior spinal fusion, C2-C4 laminectomy Does the need for close, 24 hr/day medical supervision in concert with the patient's rehab needs make it unreasonable for this patient to be served in a less intensive setting? Yes Co-Morbidities requiring supervision/potential complications:  1) postoperative pain: continue scheduled tylenol 2) Obesity: provide dietary education 3) constipation: continue senna 4) Hyperglycemia 2/2 type 2 diabetes: provide dietary education 5) HTN Due to bladder management, bowel management, safety, skin/wound care, disease management, medication administration, pain management, and patient education, does the patient require 24 hr/day rehab nursing? Yes Does the patient require coordinated care of a physician, rehab nurse, therapy disciplines of PT, OT to address physical and functional deficits in the context of the above medical diagnosis(es)? Yes Addressing deficits in the following areas: balance, endurance, locomotion, strength, transferring, bowel/bladder control, bathing, dressing, feeding, grooming, toileting, and psychosocial support Can the patient actively participate in an intensive therapy program of at least 3 hrs of therapy per day at least 5 days per week? Yes The potential for patient to make measurable gains while on inpatient rehab is excellent Anticipated functional outcomes upon discharge from inpatient rehab are min assist  with PT, supervision with OT, supervision with SLP. Estimated rehab length of stay to reach the above functional goals is:  14-16 days Anticipated discharge destination: Home Overall Rehab/Functional Prognosis: excellent   POST ACUTE RECOMMENDATIONS: This patient's condition is appropriate for continued rehabilitative care in the following setting: CIR Patient has agreed to participate in recommended program. Yes Note that insurance prior authorization may be required for reimbursement for recommended care.     I have personally performed a face to face diagnostic evaluation of this patient. Additionally, I have examined the patient's medical record including any pertinent labs and radiographic images.     Thanks,   Horton Chin, MD 05/01/2023          Routing History

## 2023-05-06 NOTE — IPOC Note (Addendum)
 Overall Plan of Care [facility]) Patient Details Name: Caleb Wall MRN: [id number] DOB: 1955/06/02  Admitting Diagnosis: Cervical myelopathy [facility])  Hospital Problems: Principal Problem:   Cervical myelopathy (HCC)     Functional Problem List: Nursing Bladder, Bowel, Endurance, Medication Management, Pain, Safety, Skin Integrity  PT Balance, Safety, Endurance, Motor, Pain  OT Balance, Safety, Endurance, Motor, Pain, Perception, Skin Integrity, Sensory  SLP    TR         Basic ADL's: OT Eating, Grooming, Bathing, Dressing, Toileting     Advanced  ADL's: OT       Transfers: PT Bed Mobility, Bed to Chair, Car, Occupational psychologist, Research scientist (life sciences): PT Stairs, Ambulation, Psychologist, prison and probation services     Additional Impairments: OT Fuctional Use of Upper Extremity  SLP        TR      Anticipated Outcomes Item Anticipated Outcome  Self Feeding Mod I  Swallowing      Basic self-care  Mod I  Toileting  Mod I   Bathroom Transfers Mod I  Bowel/Bladder  manage  bowel with medications/ manage bladder with time  toileting  Transfers  Mod I  Locomotion  Mod I  Communication     Cognition     Pain  <4 w/ prns  Safety/Judgment  manage safety with minimal assistance   Therapy Plan: PT Intensity: Minimum of 1-2 x/day ,45 to 90 minutes PT Frequency: 5 out of 7 days PT Duration Estimated Length of Stay: 7-10 days. OT Intensity: Minimum of 1-2 x/day, 45 to 90 minutes OT Frequency: 5 out of 7 days OT Duration/Estimated Length of Stay: 7-10 days     Team Interventions: Nursing Interventions Patient/Family Education, Medication Management, Bladder Management, Bowel Management, Disease Management/Prevention, Pain Management, Discharge Planning, Skin Care/Wound Management  PT interventions Ambulation/gait training, Community reintegration, Neuromuscular re-education, Stair training, UE/LE Strength taining/ROM, Wheelchair propulsion/positioning, UE/LE Coordination  activities, Therapeutic Activities, Pain management, Discharge planning, Warden/ranger, Functional mobility training, Patient/family education, Therapeutic Exercise  OT Interventions Discharge planning, Warden/ranger, Community reintegration, Disease mangement/prevention, Fish farm manager, Functional electrical stimulation, Functional mobility training, Neuromuscular re-education, Pain management, Patient/family education, Psychosocial support, Splinting/orthotics, UE/LE Strength taining/ROM, Visual/perceptual remediation/compensation, Therapeutic Exercise, Wheelchair propulsion/positioning, Skin care/wound managment, Self Care/advanced ADL retraining, Therapeutic Activities, UE/LE Coordination activities  SLP Interventions    TR Interventions    SW/CM Interventions Discharge Planning, Psychosocial Support, Patient/Family Education   Barriers to Discharge MD  Medical stability, Home enviroment access/loayout, Wound care, Weight, and Weight bearing restrictions  Nursing Decreased caregiver support, Home environment access/layout Discharge: House;  Home Layout: One level  Discharge Home Access: Stairs to enter  Entrance Stairs-Rails: Can reach both  Entrance Stairs-Number of Steps: 4  PT Inaccessible home environment    OT Neurogenic Bowel & Bladder, Wound Care, Weight    SLP      SW Insurance for SNF coverage     Team Discharge Planning: Destination: PT-Home ,OT- Home , SLP-  Projected Follow-up: PT-Outpatient PT, OT-  Home health OT, SLP-  Projected Equipment Needs: PT-To be determined, Rolling walker with 5" wheels, OT- To be determined, SLP-  Equipment Details: PT- , OT-  Patient/family involved in discharge planning: PT- Patient,  OT-Patient, SLP-   MD ELOS: 7-0 days Medical Rehab Prognosis:  Good Assessment: The patient has been admitted for CIR therapies with the diagnosis of cervical myelopathy. The team will be addressing functional  mobility, strength, stamina, balance, safety, adaptive techniques and equipment, self-care, bowel  and bladder mgt, patient and caregiver education, drain care. Goals have been set at mod I . Anticipated discharge destination is home.        See Team Conference Notes for weekly updates to the plan of care

## 2023-05-06 NOTE — Progress Notes (Signed)
 PROGRESS NOTE   Subjective/Complaints:  Pt reports a lot of neck pain - thought was getting better, but worse overnight/this AM.   Nursing suggested soft collar for comfort as needed- will order.   LBM 6 days ago- feels very constipated.   A1c 5.7- will stop SSI and CBG's.     ROS:   Pt denies SOB, abd pain, CP, N/V/C/D, and vision changes    Objective:   VAS [place] LOWER EXTREMITY VENOUS (DVT) Result Date: 05/04/2023  Lower Venous DVT Study Patient Name:  Caleb Wall  Date of Exam:   05/04/2023 Medical Rec #: [id number]        Accession #:    [id number] Date of Birth: April 01, 1955         Patient Gender: M Patient Age:   67 years Exam Location:  [facility] Procedure:      VAS [place] LOWER EXTREMITY VENOUS (DVT) Referring Phys: Mariam Dollar --------------------------------------------------------------------------------  Indications: Swelling.  Risk Factors: None identified. Comparison Study: No prior studies. Performing Technologist: Chanda Busing RVT  Examination Guidelines: A complete evaluation includes B-mode imaging, spectral Doppler, color Doppler, and power Doppler as needed of all accessible portions of each vessel. Bilateral testing is considered an integral part of a complete examination. Limited examinations for reoccurring indications may be performed as noted. The reflux portion of the exam is performed with the patient in reverse Trendelenburg.  +---------+---------------+---------+-----------+----------+--------------+ RIGHT    CompressibilityPhasicitySpontaneityPropertiesThrombus Aging +---------+---------------+---------+-----------+----------+--------------+ CFV      Full           Yes      Yes                                 +---------+---------------+---------+-----------+----------+--------------+ SFJ      Full                                                         +---------+---------------+---------+-----------+----------+--------------+ FV Prox  Full                                                        +---------+---------------+---------+-----------+----------+--------------+ FV Mid   Full                                                        +---------+---------------+---------+-----------+----------+--------------+ FV DistalFull                                                        +---------+---------------+---------+-----------+----------+--------------+  PFV      Full                                                        +---------+---------------+---------+-----------+----------+--------------+ POP      Full           Yes      Yes                                 +---------+---------------+---------+-----------+----------+--------------+ PTV      Full                                                        +---------+---------------+---------+-----------+----------+--------------+ PERO     Full                                                        +---------+---------------+---------+-----------+----------+--------------+   +---------+---------------+---------+-----------+----------+--------------+ LEFT     CompressibilityPhasicitySpontaneityPropertiesThrombus Aging +---------+---------------+---------+-----------+----------+--------------+ CFV      Full           Yes      Yes                                 +---------+---------------+---------+-----------+----------+--------------+ SFJ      Full                                                        +---------+---------------+---------+-----------+----------+--------------+ FV Prox  Full                                                        +---------+---------------+---------+-----------+----------+--------------+ FV Mid   Full                                                         +---------+---------------+---------+-----------+----------+--------------+ FV DistalFull                                                        +---------+---------------+---------+-----------+----------+--------------+ PFV      Full                                                        +---------+---------------+---------+-----------+----------+--------------+  POP      Full           Yes      Yes                                 +---------+---------------+---------+-----------+----------+--------------+ PTV      Full                                                        +---------+---------------+---------+-----------+----------+--------------+ PERO     Full                                                        +---------+---------------+---------+-----------+----------+--------------+     Summary: RIGHT: - There is no evidence of deep vein thrombosis in the lower extremity.  - No cystic structure found in the popliteal fossa.  LEFT: - There is no evidence of deep vein thrombosis in the lower extremity.  - No cystic structure found in the popliteal fossa.  *See table(s) above for measurements and observations. Electronically signed by Heath Lark on 05/04/2023 at 2:26:14 PM.    Final    Recent Labs    05/04/23 0544 05/06/23 0439  WBC 13.1* 10.3  HGB 9.0* 8.8*  HCT 27.0* 26.2*  PLT 199 233   Recent Labs    05/06/23 0439  NA 131*  K 3.8  CL 101  CO2 25  GLUCOSE 103*  BUN 11  CREATININE 0.91  CALCIUM 8.4*         Intake/Output Summary (Last 24 hours) at 05/06/2023 0856 Last data filed at 05/06/2023 0829 Gross per 24 hour  Intake 476 ml  Output 1115 ml  Net -639 ml        Physical Exam: Vital Signs Blood pressure (!) 163/82, pulse 80, temperature 99.2 F (37.3 C), temperature source Oral, resp. rate 18, height 5\' 7"  (1.702 m), SpO2 100%.   General: awake, alert, appropriate, sitting up in bed;  NAD HENT: conjugate gaze; oropharynx moist-  drain has ~50cc in drain, not clear when emptied CV: regular rate and rhythm; no JVD Pulmonary: CTA B/L; no W/R/R- good air movement GI: soft, NT, distended vs protuberant, (+) hypoactive BS Psychiatric: appropriate- trying ot joke a lot Neurological: Ox3  MSK: able to move all extremities antigravity  PRIOR EXAMS: Neurological:     Mental Status: He is alert.     Comments: Alert and oriented x 3. Normal insight and awareness. Intact Memory. Normal language and speech. Cranial nerve exam unremarkable. MMT:  UE grossly 4/5 prox to distal, perhaps a bit of HI weakness. BLE 4/5 prox to distal. Decreased LT in finger tips of both hands. DTR's 1+. No abnl resting tone.   Assessment/Plan: 1. Functional deficits which require 3+ hours per day of interdisciplinary therapy in a comprehensive inpatient rehab setting. Physiatrist is providing close team supervision and 24 hour management of active medical problems listed below. Physiatrist and rehab team continue to assess barriers to discharge/monitor patient progress toward functional and medical goals  Care Tool:  Bathing    Body parts bathed by patient: Right arm, Left  arm, Chest, Abdomen, Front perineal area, Right upper leg, Left upper leg, Face   Body parts bathed by helper: Buttocks, Right lower leg, Left lower leg     Bathing assist Assist Level: Moderate Assistance - Patient 50 - 74%     Upper Body Dressing/Undressing Upper body dressing   What is the patient wearing?: Pull over shirt    Upper body assist Assist Level: Moderate Assistance - Patient 50 - 74%    Lower Body Dressing/Undressing Lower body dressing      What is the patient wearing?: Underwear/pull up, Pants     Lower body assist Assist for lower body dressing: Maximal Assistance - Patient 25 - 49%     Toileting Toileting    Toileting assist Assist for toileting: Minimal Assistance - Patient > 75%     Transfers Chair/bed transfer  Transfers assist      Chair/bed transfer assist level: Minimal Assistance - Patient > 75%     Locomotion Ambulation   Ambulation assist      Assist level: Minimal Assistance - Patient > 75% Assistive device: Walker-rolling Max distance: 80   Walk 10 feet activity   Assist     Assist level: Minimal Assistance - Patient > 75% Assistive device: Walker-rolling   Walk 50 feet activity   Assist Walk 50 feet with 2 turns activity did not occur: Safety/medical concerns  Assist level: Minimal Assistance - Patient > 75% Assistive device: Walker-rolling    Walk 150 feet activity   Assist Walk 150 feet activity did not occur: Safety/medical concerns         Walk 10 feet on uneven surface  activity   Assist     Assist level: Minimal Assistance - Patient > 75% (ramp) Assistive device: Walker-rolling   Wheelchair     Assist Is the patient using a wheelchair?: Yes Type of Wheelchair: Manual    Wheelchair assist level: Supervision/Verbal cueing Max wheelchair distance: 100    Wheelchair 50 feet with 2 turns activity    Assist        Assist Level: Supervision/Verbal cueing   Wheelchair 150 feet activity     Assist      Assist Level: Moderate Assistance - Patient 50 - 74%   Blood pressure (!) 163/82, pulse 80, temperature 99.2 F (37.3 C), temperature source Oral, resp. rate 18, height 5\' 7"  (1.702 m), SpO2 100%.  Medical Problem List and Plan: 1. Functional deficits secondary to cervical stenosis/myelopathy status post C6-7 ACDF and C2-4 laminectomies, C2-T2 PSF -has history of C3-C6 ACDF 12/26/2020. -NO BRACE REQUIRED.Marland Kitchen   -patient may not shower             -ELOS/Goals: 14-16 days, supervision to min assist goals with PT, OT    -Con't CIR PT and OT- pt having a lot of neck pain- will order soft collar AS NEEDED for comfort 2.  Antithrombotics: -DVT/anticoagulation:  Pharmaceutical: Lovenox 40mg  daily             -antiplatelet therapy: N/A  -05/04/23  vascular U/S neg for DVTs 3. Pain Management: Neurontin 300 mg 3 times daily; Tylenol, Robaxin, and oxycodone as needed -05/05/23 pt requesting robaxin be changed to flexeril-- ordered 10mg  TID PRN 4. Mood/Behavior/Sleep: Provide emotional support             -antipsychotic agents: N/A -05/04/23 didn't sleep well but perseverates on other issues so doesn't really tell me if he wants anything for this; monitor for now -05/05/23 poor sleep again, agreeable  with trying melatonin 5mg  PRN 3/17- slept a little better 5. Neuropsych/cognition: This patient is capable of making decisions on his own behalf. 6. Skin/Wound Care: Routine skin checks -continue posterior surgical site drain until output is less than 100 cc over 24 hours or less than 50 cc over 12 hours.  -drain became disconnected during transfer from [facility]. Tubing was reconnected and I recharged the hemovac container. Observe tonight for functionality 3/17- is draining- however very little in hemovac- called Dr Katrinka Blazing and if no more than 100cc in last 24 hours, can remove- will double check with nursing first to see when emptied.  7. Fluids/Electrolytes/Nutrition: Routine I&O's with follow-up chemistries -05/04/23 pt very adamant that he be on regular diet; on carb mod, explained rationale behind this. Pt agreeable in the end.  8.  BPH.  Uroxatrol 10 mg daily, Cialis 2.5 mg daily (not restarted?), continue prophylactic Macrobid 100 mg daily 9.  Constipation.  Senokot 1 tablet twice daily, Colace 100 mg twice daily ---> change to senokot-s 2 qhs             -LBM 3/11 -pt says that he feels like he needs to empty. Will have dulcolax suppository available as needed. Sorbitol prn also.  -05/04/23 no BM since Monday, will start miralax BID, colace BID, continue senokot 2 tabs nightly, and use sorbitol 30ml once now.  -05/05/23 still no BM, wants to try sorbitol 60ml one more time today; cont other meds; monitor closely 3/17- Will order Sorbitol 60cc after  therapy and then soap suds enema per pt request at 6pm if no BM- should make him go 10.  Diabetes mellitus.  Latest hemoglobin A1c 6.1, recheck Monday.  Patient on Mounjaro 10 mg weekly prior to admission. SSI, CBG checks.  -pt states that family will bring it in. -05/04/23 pt upset that he's on SSI, explained why; waiting on mounjaro to be brought in as well; for now continue SSI; CBGs are ok.  -05/05/23 mounjaro in for Friday 3/21; CBGs look great; monitor 3/17- will stop CBGs and SSI- since new A1c is 5.7 CBG (last 3)  Recent Labs    05/05/23 1657 05/05/23 2104 05/06/23 0611  GLUCAP 101* 125* 111*    11.  Primary open-angle glaucoma left eye.  Continue eyedrops 12. ABLA: Hgb 9.0 on 05/04/23, relatively stable; monitor  3/17- Hb 8.8- overall stable- con't to monitor 13. Leukocytosis:  -05/04/23 was 11.4 on 3/14, up to 13.1 on 3/15, but did take decadron recently... monitor on Monday, no s/sx of infection at this point 3/17- WBC down to 10.3 - no signs of infection 14. GERD: continue protonix 40mg  daily 15. HTN  3/17- pt on Cialis at home 5 mg 3x/week- willing to restart for HTN, since BP not controlled- pt says "has tried everything" in past.    I spent a total of   43 minutes on total care today- >50% coordination of care- due to  D/w Dr Katrinka Blazing, as well as PA- abut drain- will d/w nursing- review of labs and vitals  LOS: 3 days A FACE TO FACE EVALUATION WAS PERFORMED  Amanii Snethen 05/06/2023, 8:56 AM

## 2023-05-06 NOTE — Progress Notes (Signed)
 Orthopedic Tech Progress Note Patient Details:  Caleb Wall 07/01/1955 [id number]  Ortho Devices Type of Ortho Device: Soft collar Ortho Device/Splint Location: NECK Ortho Device/Splint Interventions: Ordered, Application, Adjustment   Post Interventions Patient Tolerated: Well Instructions Provided: Care of device  [name] 05/06/2023, 12:04 PM

## 2023-05-06 NOTE — Progress Notes (Signed)
 Inpatient Rehabilitation Care Coordinator Assessment and Plan Patient Details  Name: Caleb Wall MRN: [id number] Date of Birth: 30-Apr-1955  Today's Date: 05/06/2023  Hospital Problems: Principal Problem:   Cervical myelopathy [facility])  Past Medical History:  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    Other secondary kyphosis, cervical region    Prediabetes    Primary open angle glaucoma (POAG) of left eye    Pseudoarthrosis of cervical spine (HCC)    Sepsis secondary to UTI (HCC)    Spondylosis of cervical spine    Past Surgical History:  Past Surgical History:  Procedure Laterality Date   ANTERIOR CERVICAL DECOMPRESSION/DISCECTOMY FUSION 4 LEVELS N/A 12/26/2020   Procedure: C3-6 ANTERIOR CERVICAL DECOMPRESSION/DISCECTOMY FUSION 3 LEVELS;  Surgeon: Lucy Chris, MD;  Location: [facility] ORS;  Service: Neurosurgery;  Laterality: N/A;   BROW LIFT AND BLEPHAROPLASTY Bilateral 12/21/2019   CATARACT EXTRACTION W/ INTRAOCULAR LENS IMPLANT Left 02/05/2019   COLONOSCOPY WITH PROPOFOL N/A 09/29/2021   Procedure: COLONOSCOPY WITH PROPOFOL;  Surgeon: Regis Bill, MD;  Location: [facility] ENDOSCOPY;  Service: Endoscopy;  Laterality: N/A;   KNEE SURGERY Left 2002   Meniscus tear   LITHOTRIPSY  12/13/2020   MASS EXCISION N/A 10/11/2022   Procedure: EXCISION MASS CHEST WALL;  Surgeon: Sung Amabile, DO;  Location: [facility] ORS;  Service: General;  Laterality: N/A;   ORIF FEMORAL NECK FRACTURE W/ DHS Left 03/06/2020   SHOULDER ARTHROSCOPY WITH OPEN ROTATOR CUFF REPAIR AND DISTAL CLAVICLE ACROMINECTOMY Left 07/25/2021   Procedure: SHOULDER ARTHROSCOPY WITH OPEN ROTATOR CUFF REPAIR AND DISTAL CLAVICLE  ACROMINECTOMY;  Surgeon: Juanell Fairly, MD;  Location: [facility] ORS;  Service: Orthopedics;  Laterality: Left;   TOTAL HIP ARTHROPLASTY Left 03/06/2020   Social History:  reports that he quit smoking about 3 years ago. His smoking use included cigarettes. He started smoking about 43 years ago. He has a 20 pack-year smoking history. He has never used smokeless tobacco. He reports that he does not currently use drugs after having used the following drugs: Marijuana. He reports that he does not drink alcohol.  Family / Support Systems Marital Status: Married Patient Roles: Spouse, Parent, Other (Comment) (employee) Spouse/Significant Other: [name] [phone] Children: Daughter is local and can assist PRN Other Supports: Friends Anticipated Caregiver: Wife Ability/Limitations of Caregiver: can do supervision-min assist-pt;s goals are mod/i level Caregiver Availability: 24/7 Family Dynamics: Close knit with wife and daughter and has good social supports. He reports he has friends who will check on him at [place]  Social History Preferred language: English Religion: Christian Cultural Background: NA Education: HS Health Literacy - How often do you need to have someone help you when you read instructions, pamphlets, or other written material from your doctor or pharmacy?: Never Writes: Yes Employment Status: Employed Name of Employer: [employer] Return to Work Plans: Hopes to once recovered and can be more Chief Executive Officer Issues: NA Guardian/Conservator: NA MD feels he is capable of making his own decisions while here   Abuse/Neglect Abuse/Neglect Assessment Can Be Completed: Yes Physical Abuse: Denies Verbal Abuse: Denies Sexual Abuse: Denies Exploitation of patient/patient's resources: Denies Self-Neglect: Denies  Patient response to: Social Isolation - How often do you feel lonely or isolated from those around you?: Never  Emotional Status Pt's affect,  behavior and adjustment status: Pt is motivated to do well and recover from his back surgery. He feels he is doing well but is so tired and sleeping more than usual. He is glad to be doing well but does have pain issues. Was independent prior to admission Recent Psychosocial Issues: was doing well until back issues Psychiatric History: No history-issues seems to be coping appropriately and is able to express his feelings and concerns Substance Abuse History: Quit tobacco 3 years ago  Patient / Family Perceptions, Expectations & Goals Pt/Family understanding of illness & functional limitations: Pt is able to explain his surgery and limitations from this. He does talk with the MD's involved and feels he has a good understanding of his treatment plan going forward. Premorbid pt/family roles/activities: husband, father, employee, friend, etc Anticipated changes in roles/activities/participation: resume Pt/family expectations/goals: Pt states: " I hope to be independent and take care of myself when I leave here."  [employer]: None Premorbid Home Care/DME Agencies: Other (Comment) (grab bars, tub seat, cane) Transportation available at discharge: self and wife Is the patient able to respond to transportation needs?: Yes In the past 12 months, has lack of transportation kept you from medical appointments or from getting medications?: No In the past 12 months, has lack of transportation kept you from meetings, work, or from getting things needed for daily living?: No  Discharge Planning Living Arrangements: Spouse/significant other Support Systems: Spouse/significant other, Children, Friends/neighbors, Other relatives Type of Residence: Private residence Insurance Resources: Media planner (specify) ([employer] Medicare) Financial Resources: Employment, Garment/textile technologist Screen Referred: No Living Expenses: Banker Management: Patient, Spouse Does the  patient have any problems obtaining your medications?: No Home Management: both Patient/Family Preliminary Plans: Return home with wife who can assist him at discharge if necessary. Pt is doing well and recovering from his back surgery. Made aware team conference tomorrow will have a target DC date for him them Care Coordinator Barriers to Discharge: Insurance for SNF coverage Care Coordinator Anticipated Follow Up Needs: HH/OP  Clinical Impression Pleasant gentleman who is motivated to do well and is making good progress in therapies. His wife is supportive and able to assist if needed. Will meet with tomorrow after team conference to give update  Lucy Chris 05/06/2023, 9:29 AM

## 2023-05-06 NOTE — Progress Notes (Signed)
 Physical Therapy Session Note  Patient Details  Name: Caleb Wall MRN: [id number] Date of Birth: 1955/12/06  Today's Date: 05/06/2023 PT Individual Time: 0949-1100 PT Individual Time Calculation (min): 71 min   Short Term Goals: Week 1:  PT Short Term Goal 1 (Week 1): STG =LTG 2/2 ELOS  Skilled Therapeutic Interventions/Progress Updates:      Therapy Documentation Precautions:  Precautions Precautions: Cervical, Other (comment) (hemovac drain.) Precaution/Restrictions Comments: No cervical brace ordered. Restrictions Weight Bearing Restrictions Per Provider Order: No  Pt agreeable to PT session with emphasis on gait and stair training to increase independence with functional mobility. Pt with unrated cervical pain, pre-medicated and provided rest/repositioning for relief. Dependent transport by w/c for time management to/from main gym. Pt ambulated 93' x 3 with RW with cues for increased right hip flexion to improve foot clearance via multi-modal cues. Pt able to demonstrate appropriately and carryover throughout remainder of session. Pt transitioned to stair training, reported one episode of lightheadedness provided rest for relief. Pt CGA with steps x 12 with 2 HR's with cues for sequencing. Pt returned to room, left seated in recliner at bedside with nurse present.     Therapy/Group: Individual Therapy  [name] [name] PT, DPT  05/06/2023, 12:40 PM

## 2023-05-06 NOTE — Progress Notes (Signed)
 [name], MD  Physician Physical Medicine and Rehabilitation   PMR Pre-admission    Signed   Date of Service: 05/02/2023  8:42 AM  Related encounter: Admission (Discharged) from 04/30/2023 in [facility] (1A)   Signed     Expand All Collapse All  PMR Admission Coordinator Pre-Admission Assessment   Patient: Caleb Wall is an 68 y.o., male MRN: [id number] DOB: 08-03-1955 Height: 5\' 7"  (170.2 cm) Weight: 100.7 kg                                                                                                                                                  Insurance Information HMO: yes    PPO:      PCP:      IPA:      80/20:      OTHER:  PRIMARY: [employer]      Policy#: [id number]      Subscriber: pt CM Name: faxed approval      Phone#: [phone]     Fax#: [phone] Pre-Cert#: [id number] auth for CIR via faxed approval with updates due to fax listed above on 3/18 (5 days from admit)      Employer:  Benefits:  Phone #: [phone]     Name:  Eff. Date: 02/20/23     Deduct: $0      Out of Pocket Max: $4000 (met $211.85)      Life Max:   CIR: $275/day for 5 days      SNF: $20/day for 20 days, then $214/day for 40 days Outpatient:      Co-Pay: $20/visit Home Health: 100%      Co-Pay:  DME: 80%     Co-Pay: 20% Providers:  SECONDARY:       Policy#:       Phone#:    Artist:       Phone#:    The Engineer, materials Information Summary" for patients in Inpatient Rehabilitation Facilities with attached "Privacy Act Statement-Health Care Records" was provided and verbally reviewed with: Patient and Family   Emergency Contact Information Contact Information       Name Relation Home Work Mobile    Olar Spouse [phone]   [phone]         Other Contacts   None on File      Current Medical History  Patient Admitting Diagnosis: cervical myelopathy   History of Present Illness: Pt is a 68 y/o male with  PMH of cervical myelopathy, HTN, BPH admitted to [facility] on 04/30/23 for anterior cervical discectomy and fusion of C2-T2 and posterior spinal fusion of C2-4 for management of cervical myelopathy.  Hospital course pain management, bowel management, diabetes and HTN management.  Therapy evaluations completed and pt was recommended for CIR.  Glasgow Coma Scale Score: 15   Patient's medical  record from [facility] has been reviewed by the rehabilitation admission coordinator and physician.   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 Yes   Family History  family history includes Cancer in his maternal uncle; Diabetes in his maternal aunt and paternal aunt; Heart disease in his mother; Hypertension in his mother.     Current Medications   Current Medications    Current Facility-Administered Medications:    acetaminophen (TYLENOL) tablet 650 mg, 650 mg, Oral, Q4H PRN, 650 mg at 05/02/23 0320 **OR** acetaminophen (TYLENOL) suppository 650 mg, 650 mg, Rectal, Q4H PRN, Frisbie, Brooke, PA-C   alfuzosin (UROXATRAL) 24 hr tablet 10 mg, 10 mg, Oral, Daily, Frisbie, Brooke, PA-C, 10 mg at 05/01/23 1144   alum & mag hydroxide-simeth (MAALOX/MYLANTA) 200-200-20 MG/5ML suspension 30 mL, 30 mL, Oral, Q6H PRN, Frisbie, Brooke, PA-C   bisacodyl (DULCOLAX) EC tablet 10 mg, 10 mg, Oral,  Daily PRN, Frisbie, Brooke, PA-C   brimonidine (ALPHAGAN) 0.2 % ophthalmic solution 1 drop, 1 drop, Both Eyes, BID, Frisbie, Brooke, PA-C, 1 drop at 05/01/23 2128   docusate sodium (COLACE) capsule 100 mg, 100 mg, Oral, BID, Frisbie, Brooke, PA-C, 100 mg at 05/01/23 2128   dorzolamide-timolol (COSOPT) 2-0.5 % ophthalmic solution 1 drop, 1 drop, Both Eyes, BID, Frisbie, Brooke, PA-C, 1 drop at 05/01/23 2128   enoxaparin (LOVENOX) injection 40 mg, 40 mg, Subcutaneous, Q24H, Frisbie, Brooke, PA-C, 40 mg at 05/01/23 9147   gabapentin (NEURONTIN) capsule 300 mg, 300 mg, Oral, TID, Frisbie, Brooke, PA-C, 300 mg at 05/01/23 2128   HYDROmorphone (DILAUDID) injection 0.5 mg, 0.5 mg, Intravenous, Q3H PRN, Frisbie, Brooke, PA-C, 0.5 mg at 05/01/23 0157   latanoprost (XALATAN) 0.005 % ophthalmic solution 1 drop, 1 drop, Both Eyes, QHS, Frisbie, Brooke, PA-C, 1 drop at 05/01/23 2129   magnesium citrate solution 1 Bottle, 1 Bottle, Oral, Once PRN, Frisbie, Brooke, PA-C   menthol-cetylpyridinium (CEPACOL) lozenge 3 mg, 1 lozenge, Oral, PRN **OR** phenol (CHLORASEPTIC) mouth spray 1 spray, 1 spray, Mouth/Throat, PRN, Frisbie, Brooke, PA-C   methocarbamol (ROBAXIN) tablet 500 mg, 500 mg, Oral, Q6H PRN **OR** methocarbamol (ROBAXIN) injection 500 mg, 500 mg, Intravenous, Q6H PRN, Frisbie, Brooke, PA-C, 500 mg at 04/30/23 2000   nitrofurantoin (macrocrystal-monohydrate) (MACROBID) capsule 100 mg, 100 mg, Oral, Daily, Frisbie, Brooke, PA-C, 100 mg at 05/01/23 1144   ondansetron (ZOFRAN) tablet 4 mg, 4 mg, Oral, Q6H PRN **OR** ondansetron (ZOFRAN) injection 4 mg, 4 mg, Intravenous, Q6H PRN, Frisbie, Brooke, PA-C, 4 mg at 05/01/23 8295   Oral care mouth rinse, 15 mL, Mouth Rinse, PRN, Lovenia Kim, MD  oxyCODONE (Oxy IR/ROXICODONE) immediate release tablet 10 mg, 10 mg, Oral, Q3H PRN, Frisbie, Brooke, PA-C, 10 mg at 05/02/23 1610   oxyCODONE (Oxy IR/ROXICODONE) immediate release tablet 5 mg, 5 mg, Oral, Q3H PRN,  Frisbie, Brooke, PA-C   pantoprazole (PROTONIX) injection 40 mg, 40 mg, Intravenous, QHS, Frisbie, Brooke, PA-C, 40 mg at 05/01/23 2128   senna (SENOKOT) tablet 8.6 mg, 1 tablet, Oral, BID, Frisbie, Brooke, PA-C, 8.6 mg at 05/01/23 2128   senna-docusate (Senokot-S) tablet 1 tablet, 1 tablet, Oral, QHS PRN, Frisbie, Brooke, PA-C   sodium chloride flush (NS) 0.9 % injection 3 mL, 3 mL, Intravenous, Q12H, Frisbie, Brooke, PA-C, 3 mL at 05/01/23 2129   sodium chloride flush (NS) 0.9 % injection 3 mL, 3 mL, Intravenous, PRN, Frisbie, Brooke, PA-C   tadalafil (CIALIS) tablet 2.5 mg, 2.5 mg, Oral, Daily, Frisbie, Brooke, PA-C, 2.5 mg at 05/01/23 0000     Patients Current Diet:  Diet Order                  Diet Heart Room service appropriate? Yes; Fluid consistency: Thin  Diet effective now                         Precautions / Restrictions Precautions Precautions: Cervical Restrictions Weight Bearing Restrictions Per Provider Order: No    Has the patient had 2 or more falls or a fall with injury in the past year?Yes   Prior Activity Level Limited Community (1-2x/wk): independent with occasional cane in community, driving   Prior Functional Level Prior Function Prior Level of Function : Independent/Modified Independent, History of Falls (last six months), Driving Mobility Comments: Cane PRN when legs feel weak ADLs Comments: Indep   Self Care: Did the patient need help bathing, dressing, using the toilet or eating?  Independent   Indoor Mobility: Did the patient need assistance with walking from room to room (with or without device)? Independent   Stairs: Did the patient need assistance with internal or external stairs (with or without device)? Independent   Functional Cognition: Did the patient need help planning regular tasks such as shopping or remembering to take medications? Independent   Patient Information Are you of Hispanic, Latino/a,or Spanish origin?: A. No, not of  Hispanic, Latino/a, or Spanish origin What is your race?: B. Black or African American Do you need or want an interpreter to communicate with a doctor or health care staff?: 0. No   Patient's Response To:  Health Literacy and Transportation Is the patient able to respond to health literacy and transportation needs?: Yes Health Literacy - How often do you need to have someone help you when you read instructions, pamphlets, or other written material from your doctor or pharmacy?: Never In the past 12 months, has lack of transportation kept you from medical appointments or from getting medications?: No In the past 12 months, has lack of transportation kept you from meetings, work, or from getting things needed for daily living?: No   Home Assistive Devices / Equipment Home Equipment: Grab bars - toilet, Grab bars - tub/shower, Hand held shower head, Shower seat, Cane - single point   Prior Device Use: Indicate devices/aids used by the patient prior to current illness, exacerbation or injury?  cane   Current Functional Level Cognition   Orientation Level: Oriented X4    Extremity Assessment (includes Sensation/Coordination)   Upper Extremity Assessment: Generalized weakness (expected post surgical hesitancy, able to functionally use for mobility, mentation  limited formal testing)  Lower Extremity Assessment: Generalized weakness, Overall WFL for tasks assessed (functional and seemingly near symmetrical LE strength)     ADLs   Overall ADL's : Needs assistance/impaired Eating/Feeding: Minimal assistance, Sitting Grooming: Wash/dry face, Maximal assistance (Wife completed while in bed) Lower Body Dressing: Maximal assistance, Sit to/from stand Functional mobility during ADLs: Rolling walker (2 wheels), +2 for physical assistance, Minimal assistance (RW; Min A for STS, MAXA to remain standing due to onset nausa, and increased level of pain when standing.) General ADL Comments: MAXA donning  briefs in standing while pt holding onto walker, MODA x1 for standing stability.     Mobility   Overal bed mobility: Needs Assistance Bed Mobility: Supine to Sit, Sit to Supine Rolling: Max assist, +2 for physical assistance, Used rails Sidelying to sit: Used rails, Max assist Supine to sit: Mod assist Sit to supine: Max assist Sit to sidelying: Max assist, +2 for physical assistance General bed mobility comments: Pt showed good effort but was ultimately very limited, needed heavy assist with all aspects of trying to get to/from supine     Transfers   Overall transfer level: Needs assistance Equipment used: Rolling walker (2 wheels) Transfers: Sit to/from Stand Sit to Stand: Mod assist, From elevated surface General transfer comment: unable to rise to standing from standard height bed and modA, further cuing for set up and sequencing and ~2" elevation with modA to rise     Ambulation / Gait / Stairs / Wheelchair Mobility   Ambulation/Gait General Gait Details: pt's mentation, pain and weakness all preculded ambulation away from the bed today but he did manage to take a few side steps along EOB with heavy cuing and plenty of UE use on walker.  Attempted marching in place, manage X 2 b/l (knee to RW cross bar) but had buckling and further attempts deferred for safety and pain considerations     Posture / Balance Balance Overall balance assessment: History of Falls, Needs assistance Sitting-balance support: Bilateral upper extremity supported, Feet supported Sitting balance-Leahy Scale: Fair Postural control: Right lateral lean Standing balance support: Bilateral upper extremity supported, During functional activity, Reliant on assistive device for balance Standing balance-Leahy Scale: Poor     Special needs/care consideration Skin surgical incision and Diabetic management yes        Previous Home Environment (from acute therapy documentation) Living Arrangements: Spouse/significant  other Available Help at Discharge: Family Type of Home: House Home Layout: One level Home Access: Stairs to enter Entrance Stairs-Rails: Can reach both Entrance Stairs-Number of Steps: 4 Bathroom Shower/Tub: Tub/shower unit Home Care Services: No Additional Comments: a/o x4   Discharge Living Setting Plans for Discharge Living Setting: Patient's home, Lives with (comment) (spouse) Type of Home at Discharge: House Discharge Home Layout: One level Discharge Home Access: Stairs to enter Entrance Stairs-Rails: Can reach both Entrance Stairs-Number of Steps: 4 Discharge Bathroom Shower/Tub: Tub/shower unit Discharge Bathroom Toilet: Handicapped height Discharge Bathroom Accessibility: Yes How Accessible: Accessible via walker Does the patient have any problems obtaining your medications?: No   Social/Family/Support Systems Patient Roles: Spouse Anticipated Caregiver: spouse, [name]  [phone] Anticipated Caregiver's Contact Information: see above Ability/Limitations of Caregiver: min assist at the absolute most Caregiver Availability: 24/7 Discharge Plan Discussed with Primary Caregiver: Yes Is Caregiver In Agreement with Plan?: Yes Does Caregiver/Family have Issues with Lodging/Transportation while Pt is in Rehab?: No     Goals Patient/Family Goal for Rehab: PT/OT supervision to min assist, SLP n/a Expected length of stay:  14-16 days Additional Information: Discharge plan: home with spouse 24/7, can provide up to min assist for mobility Pt/Family Agrees to Admission and willing to participate: Yes Program Orientation Provided & Reviewed with Pt/Caregiver Including Roles  & Responsibilities: Yes     Decrease burden of Care through IP rehab admission: n/a     Possible need for SNF placement upon discharge:Not anticipated.  Plan for discharge home with spouse providing 24/7 assist and daughter available to assist PRN.      Patient Condition: This patient's condition  remains as documented in the consult dated 05/01/23, in which the Rehabilitation Physician determined and documented that the patient's condition is appropriate for intensive rehabilitative care in an inpatient rehabilitation facility. Will admit to inpatient rehab today.   Preadmission Screen Completed By:  Stephania Fragmin, PT, DPT 05/02/2023 8:42 AM ______________________________________________________________________   Discussed status with Dr. Riley Kill on 05/03/23 at  11:14 AM  and received approval for admission today.   Admission Coordinator:  Stephania Fragmin, time 11:14 AM /Date03/14/25              Revision History

## 2023-05-06 NOTE — Progress Notes (Signed)
 Pt reports relief with soft collar

## 2023-05-06 NOTE — Progress Notes (Signed)
 Inpatient Rehabilitation  Patient information reviewed and entered into eRehab system by [facility]. Karen Kays., CCC/SLP, PPS Coordinator.  Information including medical coding, functional ability and quality indicators will be reviewed and updated through discharge.

## 2023-05-06 NOTE — Progress Notes (Signed)
 Met with patient to review current situation, conference schedule and plan of care. Reviewed medications,  incision care, pain, sleep , bowel and bladder. Assisted nurse re: incision care and dressing change. Assessed drain tube. Continue to follow along to provide educational needs to facilitate preparation for discharge.

## 2023-05-06 NOTE — Progress Notes (Signed)
 Occupational Therapy Session Note  Patient Details  Name: Caleb Wall MRN: [id number] Date of Birth: April 12, 1955  Today's Date: 05/06/2023 OT Individual Time: 0805-0900 & 1332-1445 OT Individual Time Calculation (min): 55 min & 73 min   Short Term Goals: Week 1:  OT Short Term Goal 1 (Week 1): STGs=LTGs due to patient's estimated length of stay.  Skilled Therapeutic Interventions/Progress Updates:      Therapy Documentation Precautions:  Precautions Precautions: Cervical, Other (comment) (hemovac drain.) Precaution/Restrictions Comments: No cervical brace ordered. Restrictions Weight Bearing Restrictions Per Provider Order: No Session 1 General: "Nice to meet you" Pt supine in bed upon OT arrival, agreeable to OT session.  Pain: unrated pain reported in neck, activity, intermittent rest breaks, distractions provided for pain management, pt reports tolerable to proceed.   ADL: OT provided skilled intervention for ADL tasks for increased independence and increased activity tolerance. Pt completed tasks at current level of assist: Bed mobility: SBA with increased time using bed features Grooming/oral hygiene: SBA seated in W/C, able to complete manipulation of containers LB dressing: CGA overall for underwear and pants, Pt required VC for dressing weaker leg first for greater success  Footwear: SBA for slip on shoes, pt reports owning sock side at home Bathing: CGA for sponge bathing in standing for washing peri area, pt able to use BUE in order to complete washing/drying Transfers: CGA overall for short bouts of functional mobility in room and VC to not "furniture walk" and good carryover  Other Treatments: OT providing therapeutic use of self in order to build rapport and discuss patient current situation and goals for therapy.   Pt seated in W/C at end of session with W/C alarm donned, call light within reach and 4Ps assessed.    Session 2 General: "I feel good" Pt  seated in recliner upon OT arrival, agreeable to OT.  Pain: unrated pain reported in neck, soft collar for comfort, activity, intermittent rest breaks, distractions provided for pain management, pt reports tolerable to proceed.   ADL: Pt requiring use of restroom at start of therapeutic intervention. Pt ambulating to bathroom with SPC and VC for safety. Pt completed toileting CGA in standing for voiding urine and managing pants, pt seated to void BM (nsg aware). Pt able to complete hygiene with wipes.   Exercises: Pt completed the following exercise in order to improve functional activity, strength and endurance to prepare for ADLs such as bathing. Pt completed the following exercises in seated/standing position with no noted LOB/SOB and 3x5 repetitions on each exercise with rest breaks in between trials: -resisted red theraband squat   Other Treatments: OT educating pt on use of RW vs SPC d/t pt wanting to "furniture walk". OT providing education on increased risk for falls when reaching to provide extra support when not needed. OT discussed with pt potential use of RW for home for increased safety, although will assess closer to D/C.   Pt seated in recliner at end of session with W/C alarm donned, call light within reach and 4Ps assessed.    Therapy/Group: Individual Therapy  Velia Meyer, OTD, OTR/L 05/06/2023, 4:15 PM

## 2023-05-06 NOTE — Progress Notes (Signed)
 Inpatient Rehabilitation Center Individual Statement of Services  Patient Name:  Caleb Wall  Date:  05/06/2023  Welcome to the Inpatient Rehabilitation Center.  Our goal is to provide you with an individualized program based on your diagnosis and situation, designed to meet your specific needs.  With this comprehensive rehabilitation program, you will be expected to participate in at least 3 hours of rehabilitation therapies Monday-Friday, with modified therapy programming on the weekends.  Your rehabilitation program will include the following services:  Physical Therapy (PT), Occupational Therapy (OT), 24 hour per day rehabilitation nursing, Therapeutic Recreaction (TR), Care Coordinator, Rehabilitation Medicine, Nutrition Services, and Pharmacy Services  Weekly team conferences will be held on Tuesday to discuss your progress.  Your Inpatient Rehabilitation Care Coordinator will talk with you frequently to get your input and to update you on team discussions.  Team conferences with you and your family in attendance may also be held.  Expected length of stay: 7-10 days  Overall anticipated outcome: mod/I level  Depending on your progress and recovery, your program may change. Your Inpatient Rehabilitation Care Coordinator will coordinate services and will keep you informed of any changes. Your Inpatient Rehabilitation Care Coordinator's name and contact numbers are listed  below.  The following services may also be recommended but are not provided by the Inpatient Rehabilitation Center:  Driving Evaluations Home Health Rehabiltiation Services Outpatient Rehabilitation Services Vocational Rehabilitation   Arrangements will be made to provide these services after discharge if needed.  Arrangements include referral to agencies that provide these services.  Your insurance has been verified to be:  [employer] Your primary doctor is:  [facility]  Pertinent information will be  shared with your doctor and your insurance company.  Inpatient Rehabilitation Care Coordinator:  Dossie Der, [place] [phone] or [phone]  Information discussed with and copy given to patient by: Lucy Chris, 05/06/2023, 9:30 AM

## 2023-05-07 ENCOUNTER — Encounter: Payer: Self-pay | Admitting: Neurosurgery

## 2023-05-07 ENCOUNTER — Other Ambulatory Visit (HOSPITAL_COMMUNITY): Payer: Self-pay

## 2023-05-07 DIAGNOSIS — G959 Disease of spinal cord, unspecified: Secondary | ICD-10-CM | POA: Diagnosis not present

## 2023-05-07 LAB — GLUCOSE, CAPILLARY
Glucose-Capillary: 123 mg/dL — ABNORMAL HIGH (ref 70–99)
Glucose-Capillary: 105 mg/dL — ABNORMAL HIGH (ref 70–99)
Glucose-Capillary: 103 mg/dL — ABNORMAL HIGH (ref 70–99)
Glucose-Capillary: 67 mg/dL — ABNORMAL LOW (ref 70–99)

## 2023-05-07 MED ORDER — ACETAMINOPHEN 325 MG PO TABS: 650.0000 mg | ORAL_TABLET | ORAL | Status: AC | PRN

## 2023-05-07 MED ORDER — DOCUSATE SODIUM 100 MG PO CAPS: 100.0000 mg | ORAL_CAPSULE | Freq: Two times a day (BID) | ORAL | Status: AC

## 2023-05-07 MED ORDER — POLYETHYLENE GLYCOL 3350 17 GM/SCOOP PO POWD
17.0000 g | Freq: Every day | ORAL | 0 refills | Status: AC
  Filled 2023-05-07: qty 238, 14d supply, fill #0

## 2023-05-07 NOTE — Progress Notes (Signed)
 Occupational Therapy Session Note  Patient Details  Name: Caleb Wall MRN: [id number] Date of Birth: [birth year]  Today's Date: 05/07/2023 OT Individual Time: 1300-1400 OT Individual Time Calculation (min): 60 min    Short Term Goals: Week 1:  OT Short Term Goal 1 (Week 1): STGs=LTGs due to patient's estimated length of stay.  Skilled Therapeutic Interventions/Progress Updates:    1:1 Pt wife present for session and provided education. Engaged in self care retraining at shower level. Focus on doffing clothing, safe ambulation with RW to and from the bathroom, sit to stands without pulling up on walker, dynamic balance without UE support and dressing with extra time. Pt was able to perform dressing with contact guard for pulling shirt over the head. Pt able to thread underwear and pants. Pt reports having a reacher and a sockaide at home. Pt able to doff socks with reacher - and don without sock aide. Pt left resting in the recliner.   Pt's wife checked off to ambulate pt to and from the bathroom with RW.   Therapy Documentation Precautions:  Precautions Precautions: Cervical, Other (comment) (hemovac drain.) Precaution/Restrictions Comments: No cervical brace ordered. Restrictions Weight Bearing Restrictions Per Provider Order: No  Pain: Pt reports a constant 6/10 pain but could continue with all activities; offered rest an   Therapy/Group: Individual Therapy  Roney Mans [facility] 05/07/2023, 2:28 PM

## 2023-05-07 NOTE — Progress Notes (Signed)
 This RN consulted by 22M Physician to remove posterior hemovac drain from patients posterior back. This RN removed hemovac drain with no issues. Moderate drainage at site, pressure held with gauze, no drainage after five minutes of pressure. Site secured with gauze and tegaderm dressing. This RN updated patient RN of site/dressing and educated RN on monitoring sight for excess drainage. Notified to contact physician for excess drainage.   Meryl Dare, RN

## 2023-05-07 NOTE — Progress Notes (Signed)
 Physical Therapy Session Note  Patient Details  Name: Caleb Wall MRN: [id number] Date of Birth: Feb 02, 1956  Today's Date: 05/07/2023 PT Individual Time: 0805-0833 PT Individual Time Calculation (min): 28 min   Short Term Goals: Week 1:  PT Short Term Goal 1 (Week 1): STG =LTG 2/2 ELOS  Skilled Therapeutic Interventions/Progress Updates:     Pt received seated in recliner and agreeable to therapy. Pt notes unrated neck pain, states he has received pain medications already and SPT provided frequent rest breaks for pain management.   Pt requests to use the bathroom, ambulates to toilet with RW and CGA. Pt attempts to void in standing with RW but is unable.   Pt performed 130' gait training with RW and CGA, demonstrating decreased R knee flexion and foot clearance, decreased stance time on R, and increased trunk flexion. Pt took period standing rest break.  Pt ambulated through parallel bars 4 times with R leg hurdles to facilitate R knee flexion (2 yoga blocks and 1 hurdle), CGA and verbal cues to step L leg further forward to allow better R foot clearance. Pt intermittently caught foot on yoga block, no LOB.   Pt ambulated back to room (130') without rest break after hurdles with RW and CGA, demonstrating improved R knee flexion and foot clearance, increased R stance time, and upright posture. Pt left seated in recliner with all needs in reach and alarm on.   Therapy Documentation Precautions:  Precautions Precautions: Cervical, Other (comment) (hemovac drain.) Precaution/Restrictions Comments: No cervical brace ordered. Restrictions Weight Bearing Restrictions Per Provider Order: No General:      Therapy/Group: Individual Therapy  [name] 05/07/2023, 10:20 AM

## 2023-05-07 NOTE — Progress Notes (Signed)
 Physical Therapy Session Note  Patient Details  Name: Caleb Wall MRN: [id number] Date of Birth: 1955/04/29  Today's Date: 05/07/2023 PT Individual Time: 6962-9528 PT Individual Time Calculation (min): 44 min   Short Term Goals: Week 1:  PT Short Term Goal 1 (Week 1): STG =LTG 2/2 ELOS  Skilled Therapeutic Interventions/Progress Updates:      Therapy Documentation Precautions:  Precautions Precautions: Cervical, Other (comment) (hemovac drain.) Precaution/Restrictions Comments: No cervical brace ordered. Restrictions Weight Bearing Restrictions Per Provider Order: No  Pt agreeable to PT session reports improved cervical pain with use of soft collar. Pt requested nurse to assess upper back wound and nurse performed in session. Supervision with sit<>stand transfers and ambulation with RW hospital room <>main gym. Pt requires min cues for hand placement with RW with transfers. Pt performed alternating marches without UE support 2 x 5 CGA for balance. Pt transitioned to ambulation with marches while wearing 2# ankle weight to R LE to address strength deficits. Pt left seated in recliner at bedside with all needs in reach, alarm on.    Therapy/Group: Individual Therapy  [name] [name] PT, DPT  05/07/2023, 12:49 PM

## 2023-05-07 NOTE — Patient Care Conference (Signed)
 Inpatient RehabilitationTeam Conference and Plan of Care Update Date: 05/07/2023   Time: 1150 am    Patient Name: Caleb Wall      Medical Record Number: [id number]  Date of Birth: May 25, 1955 Sex: Male         Room/Bed: 4M13C/4M13C-01 Payor Info: Payor: Training and development officer / Plan: [employer] MEDICARE ADVANTAGE / Product Type: *No Product type* /    Admit Date/Time:  05/03/2023  4:51 PM  Primary Diagnosis:  Cervical myelopathy [facility])  Hospital Problems: Principal Problem:   Cervical myelopathy [facility])    Expected Discharge Date: Expected Discharge Date: 05/10/23  Team Members Present: Physician leading conference: Dr. Genice Rouge Social Worker Present: Dossie Der, LCSW Nurse Present: Konrad Dolores, RN PT Present: [name], PT OT Present: Velia Meyer, OT PPS Coordinator present : Fae Pippin, SLP     Current Status/Progress Goal Weekly Team Focus  Bowel/Bladder   continent with bladder and bowel   no accidents   bladder and bowel training    Swallow/Nutrition/ Hydration               ADL's   CGA transfers, CGA LB dressing and toileting, SBA UB ADLs   mod I   activity tolerance, dynamic balance, safety    Mobility   CGA/supervision supine<>sit, transfers, gait ~90'ft RW, CGA steps x 8 (6 inches) with 2 HR's   Mod I  gait mechanics to increase safety    Communication                Safety/Cognition/ Behavioral Observations               Pain   8/10 pain   3/10 pain   medications and depe breathing and coughing exercises    Skin   clean, dry and intact  Incision back of neck with staples  free from infection  wound care      Discharge Planning:  Return home with wife and daughter to assist PRN if needed. Will await team's recommendations fro DME and follow up   Team Discussion: Patient was admitted post C6-7 ACDF and C2-4 laminectomies, C2-T2 PSF due to cervical stenosis/myelopathy. Patient  limited by pain,blood pressure  control: medication adjusted by MD.  Patient on target to meet rehab goals: yes, Patient requires CGA for LB dressing. Patient requires stand by assist for  UB ADLs. Patient requires CGA/supervision for  supine<>sit, transfers. Patient able to ambulate up to ~90'ft using a RW.  *See Care Plan and progress notes for long and short-term goals.   Revisions to Treatment Plan:  N/a   Teaching Needs: Safety, medications, transfers, toileting, dietary modifications, etc.   Current Barriers to Discharge: Decreased caregiver support  Possible Resolutions to Barriers: Family education DME: RW Outpatient follow -up       Medical Summary Current Status: Bladder urgency- continent Bowels- LBM yesterday-posterior drain removed today- dressing now dry dressing-  Barriers to Discharge: Self-care education;Morbid Obesity;Medical stability;Behavior/Mood;Complicated Wound;Uncontrolled Pain;Weight bearing restrictions  Barriers to Discharge Comments: uncotnrolled pain- taking pain meds q3 hours at least half of day; drain- which was finally removed; cervical myelopathy, noncompliant, sedation from pain meds? Possible Resolutions to [employer]: has RW;  needs outpt therapy- doesn't need AE from OT most likely- pt wants ot leave Friday - 3/21   Continued Need for Acute Rehabilitation Level of Care: The patient requires daily medical management by a physician with specialized training in physical medicine and rehabilitation for the following reasons: Direction of a multidisciplinary physical  rehabilitation program to maximize functional independence : Yes Medical management of patient stability for increased activity during participation in an intensive rehabilitation regime.: Yes Analysis of laboratory values and/or radiology reports with any subsequent need for medication adjustment and/or medical intervention. : Yes   I attest that I was present, lead the team conference, and concur with  the assessment and plan of the team.   Gwenyth Allegra 05/07/2023, 9:39 PM

## 2023-05-07 NOTE — Progress Notes (Signed)
 Patient ID: Caleb Wall, male   DOB: Feb 12, 1956, 68 y.o.   MRN: [id number]  Met with pt and wife was present to give team conference update regarding goals of mod/I level and discharge 3/21. Both feel he is doing well and wife plans to stay for his session at 1:00 to see for herself. Discussed equipment he will need a rolling walker he has a tub seat he likes to use. Also OPPT and he goes to [facility] and wants to go there or OPPT. Will fax an order and work on discharge needs for Friday.

## 2023-05-07 NOTE — Plan of Care (Signed)

## 2023-05-07 NOTE — Progress Notes (Signed)
 PROGRESS NOTE   Subjective/Complaints:  Pt reports the soft collar is helpful for pain cntrol- said usually taking 4-5 hours during day, pain meds, but pain gets worse at night, so taking q3 hours, but is getting better over initially.  Said had RLE weakness since 2017.  Still has a little numbness in RLE and wobbly, but less so.  LBM yesterday after intervention .Marland Kitchen Wants tylenol q4 hours- will change to lower dose and do q4 hours prn.   D/w 4N[facility] as well as Dr Dawley and Dr Katrinka Blazing about posterior drain- also spoke to our team- no one here has removed these drains- including PA here for 35 years.    ROS:     Pt denies SOB, abd pain, CP, N/V/C/D, and vision changes    Objective:   No results found.  Recent Labs    05/06/23 0439  WBC 10.3  HGB 8.8*  HCT 26.2*  PLT 233   Recent Labs    05/06/23 0439  NA 131*  K 3.8  CL 101  CO2 25  GLUCOSE 103*  BUN 11  CREATININE 0.91  CALCIUM 8.4*         Intake/Output Summary (Last 24 hours) at 05/07/2023 0855 Last data filed at 05/07/2023 0007 Gross per 24 hour  Intake --  Output 500 ml  Net -500 ml        Physical Exam: Vital Signs Blood pressure 127/80, pulse 94, temperature 98 F (36.7 C), resp. rate 18, height 5\' 7"  (1.702 m), SpO2 100%.    General: awake, alert, appropriate, sitting up in bedside chair; eating; NAD HENT: conjugate gaze; oropharynx moist- soft collar in place; drain not putting  out much  CV: regular rate and rhythm; no JVD Pulmonary: CTA B/L; no W/R/R- good air movement GI: soft, NT, ND, (+)BS- normoactive Psychiatric: appropriate Neurological: Ox3   MSK: able to move all extremities antigravity  PRIOR EXAMS: Neurological:     Mental Status: He is alert.     Comments: Alert and oriented x 3. Normal insight and awareness. Intact Memory. Normal language and speech. Cranial nerve exam unremarkable. MMT:  UE grossly 4/5 prox to  distal, perhaps a bit of HI weakness. BLE 4/5 prox to distal. Decreased LT in finger tips of both hands. DTR's 1+. No abnl resting tone.   Assessment/Plan: 1. Functional deficits which require 3+ hours per day of interdisciplinary therapy in a comprehensive inpatient rehab setting. Physiatrist is providing close team supervision and 24 hour management of active medical problems listed below. Physiatrist and rehab team continue to assess barriers to discharge/monitor patient progress toward functional and medical goals  Care Tool:  Bathing    Body parts bathed by patient: Right arm, Left arm, Chest, Abdomen, Front perineal area, Right upper leg, Left upper leg, Face   Body parts bathed by helper: Buttocks, Right lower leg, Left lower leg     Bathing assist Assist Level: Moderate Assistance - Patient 50 - 74%     Upper Body Dressing/Undressing Upper body dressing   What is the patient wearing?: Pull over shirt    Upper body assist Assist Level: Moderate Assistance - Patient 50 - 74%  Lower Body Dressing/Undressing Lower body dressing      What is the patient wearing?: Underwear/pull up, Pants     Lower body assist Assist for lower body dressing: Maximal Assistance - Patient 25 - 49%     Toileting Toileting    Toileting assist Assist for toileting: Minimal Assistance - Patient > 75%     Transfers Chair/bed transfer  Transfers assist     Chair/bed transfer assist level: Minimal Assistance - Patient > 75%     Locomotion Ambulation   Ambulation assist      Assist level: Minimal Assistance - Patient > 75% Assistive device: Walker-rolling Max distance: 80   Walk 10 feet activity   Assist     Assist level: Minimal Assistance - Patient > 75% Assistive device: Walker-rolling   Walk 50 feet activity   Assist Walk 50 feet with 2 turns activity did not occur: Safety/medical concerns  Assist level: Minimal Assistance - Patient > 75% Assistive device:  Walker-rolling    Walk 150 feet activity   Assist Walk 150 feet activity did not occur: Safety/medical concerns         Walk 10 feet on uneven surface  activity   Assist     Assist level: Minimal Assistance - Patient > 75% (ramp) Assistive device: Walker-rolling   Wheelchair     Assist Is the patient using a wheelchair?: Yes Type of Wheelchair: Manual    Wheelchair assist level: Supervision/Verbal cueing Max wheelchair distance: 100    Wheelchair 50 feet with 2 turns activity    Assist        Assist Level: Supervision/Verbal cueing   Wheelchair 150 feet activity     Assist      Assist Level: Moderate Assistance - Patient 50 - 74%   Blood pressure 127/80, pulse 94, temperature 98 F (36.7 C), resp. rate 18, height 5\' 7"  (1.702 m), SpO2 100%.  Medical Problem List and Plan: 1. Functional deficits secondary to cervical stenosis/myelopathy status post C6-7 ACDF and C2-4 laminectomies, C2-T2 PSF -has history of C3-C6 ACDF 12/26/2020. -NO BRACE REQUIRED.Marland Kitchen   -patient may not shower until Drain removed, then can shower             -ELOS/Goals: 14-16 days, supervision to min assist goals with PT, OT    Con't CIR PT and OT  Will try to get drain out today- OK' per Dr Katrinka Blazing- spoke to 4[facility]- they will try to help pt having a lot of neck pain- will order soft collar AS NEEDED for comfort 2.  Antithrombotics: -DVT/anticoagulation:  Pharmaceutical: Lovenox 40mg  daily             -antiplatelet therapy: N/A  -05/04/23 vascular U/S neg for DVTs 3. Pain Management: Neurontin 300 mg 3 times daily; Tylenol, Robaxin, and oxycodone as needed -05/05/23 pt requesting robaxin be changed to flexeril-- ordered 10mg  TID PRN 3/18- changed tylenol to 325 mg q4 hours prn- and con't Oxycodone- is q3 hours prn- will try to wean over next few days 4. Mood/Behavior/Sleep: Provide emotional support             -antipsychotic agents: N/A -05/04/23 didn't sleep well but  perseverates on other issues so doesn't really tell me if he wants anything for this; monitor for now -05/05/23 poor sleep again, agreeable with trying melatonin 5mg  PRN 3/17- slept a little better 5. Neuropsych/cognition: This patient is capable of making decisions on his own behalf. 6. Skin/Wound Care: Routine skin checks -continue posterior surgical site drain  until output is less than 100 cc over 24 hours or less than 50 cc over 12 hours.  -drain became disconnected during transfer from [facility]. Tubing was reconnected and I recharged the hemovac container. Observe tonight for functionality 3/17- is draining- however very little in hemovac- called Dr Katrinka Blazing and if no more than 100cc in last 24 hours, can remove- will double check with nursing first to see when emptied.  3/18- Dr Katrinka Blazing Ok'd removal- will try to have done today 7. Fluids/Electrolytes/Nutrition: Routine I&O's with follow-up chemistries -05/04/23 pt very adamant that he be on regular diet; on carb mod, explained rationale behind this. Pt agreeable in the end.  8.  BPH.  Uroxatrol 10 mg daily, Cialis 2.5 mg daily (not restarted?), continue prophylactic Macrobid 100 mg daily 9.  Constipation.  Senokot 1 tablet twice daily, Colace 100 mg twice daily ---> change to senokot-s 2 qhs             -LBM 3/11 -pt says that he feels like he needs to empty. Will have dulcolax suppository available as needed. Sorbitol prn also.  -05/04/23 no BM since Monday, will start miralax BID, colace BID, continue senokot 2 tabs nightly, and use sorbitol 30ml once now.  -05/05/23 still no BM, wants to try sorbitol 60ml one more time today; cont other meds; monitor closely 3/17- Will order Sorbitol 60cc after therapy and then soap suds enema per pt request at 6pm if no BM- should make him go 3/18- LBM yesterday 10.  Diabetes mellitus.  Latest hemoglobin A1c 6.1, recheck Monday.  Patient on Mounjaro 10 mg weekly prior to admission. SSI, CBG checks.  -pt states that  family will bring it in. -05/04/23 pt upset that he's on SSI, explained why; waiting on mounjaro to be brought in as well; for now continue SSI; CBGs are ok.  -05/05/23 mounjaro in for Friday 3/21; CBGs look great; monitor 3/17- will stop CBGs and SSI- since new A1c is 5.7 CBG (last 3)  Recent Labs    05/06/23 1638 05/06/23 2111 05/07/23 0651  GLUCAP 101* 103* 123*    11.  Primary open-angle glaucoma left eye.  Continue eyedrops 12. ABLA: Hgb 9.0 on 05/04/23, relatively stable; monitor  3/17- Hb 8.8- overall stable- con't to monitor 13. Leukocytosis:  -05/04/23 was 11.4 on 3/14, up to 13.1 on 3/15, but did take decadron recently... monitor on Monday, no s/sx of infection at this point 3/17- WBC down to 10.3 - no signs of infection 14. GERD: continue protonix 40mg  daily 15. HTN  3/17- pt on Cialis at home 5 mg 3x/week- willing to restart for HTN, since BP not controlled- pt says "has tried everything" in past.   3/18- BP doing much better this AM- con't regimen   I spent a total of 53   minutes on total care today- >50% coordination of care- due to  D/w pt, nursing, 4NICU charge nurse, NSU Dr Katrinka Blazing and Dr Dawley- and team conference today to determine length of stay  LOS: 4 days A FACE TO FACE EVALUATION WAS PERFORMED  Caleb Wall 05/07/2023, 8:55 AM

## 2023-05-07 NOTE — Progress Notes (Signed)
 Hypoglycemic Event  CBG: 67  Treatment: 4 oz juice/soda  Symptoms: None  Follow-up CBG: Time: 1705 CBG Result:103  Possible Reasons for Event: Unknown  Comments/MD notified:Sandra Setzer, PA    Elgie Congo

## 2023-05-08 NOTE — Progress Notes (Signed)
 Occupational Therapy Discharge Summary  Patient Details  Name: Caleb Wall MRN: [id number] Date of Birth: 28-Jun-1955  Date of Discharge from OT service:May 09, 2023  {CHL IP REHAB OT TIME CALCULATIONS:[id number]}   Patient has met 11 of 11 long term goals due to improved activity tolerance, improved balance, postural control, and ability to compensate for deficits.  Patient to discharge at overall Modified Independent level.  Patient's care partner is independent to provide the necessary physical assistance at discharge.    Reasons goals not met: All goals met  Recommendation:  Patient will benefit from ongoing skilled OT services in outpatient setting to continue to advance functional skills in the area of BADL, iADL, and Reduce care partner burden.  Equipment: No equipment provided  Reasons for discharge: treatment goals met and discharge from hospital  Patient/family agrees with progress made and goals achieved: Yes  OT Discharge Precautions/Restrictions  Precautions Precautions: Cervical Precaution/Restrictions Comments: soft collar for comfort Restrictions Weight Bearing Restrictions Per Provider Order: No ADL ADL Eating: Modified independent Where Assessed-Eating: Chair, Bed level Grooming: Modified independent Where Assessed-Grooming: Standing at sink Upper Body Bathing: Modified independent Where Assessed-Upper Body Bathing: Shower Lower Body Bathing: Modified independent Where Assessed-Lower Body Bathing: Shower Upper Body Dressing: Modified independent (Device) Where Assessed-Upper Body Dressing: Edge of bed, Chair Lower Body Dressing: Modified independent Where Assessed-Lower Body Dressing: Edge of bed Toileting: Modified independent Where Assessed-Toileting: Neurosurgeon Method: Proofreader: Bedside commode, Grab bars (RW) Tub/Shower Transfer: Modified independent Glass blower/designer Method: Ship broker: Insurance underwriter: Modified independent Film/video editor Method: Ambulating Vision Baseline Vision/History: 1 Wears glasses;3 Glaucoma Patient Visual Report: No change from baseline Vision Assessment?: No apparent visual deficits Perception  Perception: Within Functional Limits Praxis Praxis: WFL Cognition Cognition Overall Cognitive Status: Within Functional Limits for tasks assessed Arousal/Alertness: Awake/alert Orientation Level: Person;Place;Situation Person: Oriented Place: Oriented Situation: Oriented Memory: Appears intact Sustained Attention: Appears intact Awareness: Appears intact Problem Solving: Appears intact Decision Making: Appears intact Safety/Judgment: Appears intact Brief Interview for Mental Status (BIMS) Repetition of Three Words (First Attempt): 3 Temporal Orientation: Year: Correct Temporal Orientation: Month: Accurate within 5 days Temporal Orientation: Day: Correct Recall: "Sock": Yes, no cue required Recall: "Blue": Yes, no cue required Recall: "Bed": Yes, no cue required BIMS Summary Score: 15 Sensation Sensation Light Touch: Appears Intact Hot/Cold: Appears Intact Proprioception: Appears Intact Stereognosis: Not tested Motor  Motor Motor: Within Functional Limits Mobility  Bed Mobility Bed Mobility: Rolling Right;Rolling Left;Right Sidelying to Sit;Sit to Sidelying Left Rolling Right: Independent with assistive device Rolling Left: Independent with assistive device Right Sidelying to Sit: Independent with assistive device Sit to Sidelying Left: Independent with assistive device Transfers Sit to Stand: Independent with assistive device Stand to Sit: Independent with assistive device  Trunk/Postural Assessment  Cervical Assessment Cervical Assessment: Within Functional Limits Thoracic Assessment Thoracic Assessment: Within Functional Limits Lumbar  Assessment Lumbar Assessment: Within Functional Limits Postural Control Postural Control: Within Functional Limits  Balance Balance Balance Assessed: Yes Static Sitting Balance Static Sitting - Balance Support: Feet supported Static Sitting - Level of Assistance: 6: Modified independent (Device/Increase time) Dynamic Sitting Balance Dynamic Sitting - Balance Support: Feet supported Dynamic Sitting - Level of Assistance: 6: Modified independent (Device/Increase time) Dynamic Sitting - Balance Activities: Lateral lean/weight shifting;Reaching for objects Static Standing Balance Static Standing - Balance Support: Bilateral upper extremity supported Static Standing - Level of Assistance: 6: Modified independent (Device/Increase time) Dynamic Standing  Balance Dynamic Standing - Balance Support: Bilateral upper extremity supported Dynamic Standing - Level of Assistance: 6: Modified independent (Device/Increase time) Dynamic Standing - Balance Activities: Lateral lean/weight shifting;Reaching for objects Extremity/Trunk Assessment RUE Assessment RUE Assessment: Within Functional Limits Active Range of Motion (AROM) Comments: WFL for ADLs although only ~100* shoulder flexion, WFL distal joints General Strength Comments: WFL LUE Assessment Active Range of Motion (AROM) Comments: WFL for ADLs although only ~100* shoulder flexion, WFL distal joints General Strength Comments: WFL  Tx session General: "subjective***" Pt supine in bed upon OT arrival, agreeable to OT session. "subjective***" Pt seated in W/C upon OT arrival, agreeable to OT.  Pain:  ADL: OT providing therapeutic intervention with ADL tasks in order to increase independence for D/C planning and increase activity tolernace. Pt compling the following tasks during session: Bed mobility: Eating: Grooming: Oral hygiene: Toilet transfer: Toileting: UB dressing: LB dressing: Footwear: Shower  transfer: Bathing: Transfers:  Balance  Exercises:  Other Treatments:    ***Pt seated in W/C at end of session with W/C alarm donned, call light within reach and 4Ps assessed.  ***Pt supine in bed with bed alarm activated, 2 bed rails up, call light within reach and 4Ps assessed.   Velia Meyer, OTD, OTR/L 05/08/2023, 3:54 PM

## 2023-05-08 NOTE — Progress Notes (Signed)
 PROGRESS NOTE   Subjective/Complaints:  Pt reports peed a lot last night- 2L or so overnight- doesn't know why, but was asking why.   LBM 2 days ago-  Everything hurting last night after therapy- but admits asks for another set every time with therapy.  Got shower and walked to gym yesterday.   Drain out and feels good.   Got Cbgs still- one was 79 yesterday- was given OJ  Trying ot wean pain meds as much as he can.    ROS:    Pt denies SOB, abd pain, CP, N/V/C/D, and vision changes   Objective:   No results found.  Recent Labs    05/06/23 0439  WBC 10.3  HGB 8.8*  HCT 26.2*  PLT 233   Recent Labs    05/06/23 0439  NA 131*  K 3.8  CL 101  CO2 25  GLUCOSE 103*  BUN 11  CREATININE 0.91  CALCIUM 8.4*         Intake/Output Summary (Last 24 hours) at 05/08/2023 0865 Last data filed at 05/08/2023 0700 Gross per 24 hour  Intake 1660 ml  Output 2200 ml  Net -540 ml        Physical Exam: Vital Signs Blood pressure 132/81, pulse 79, temperature 98.6 F (37 C), temperature source Oral, resp. rate 18, height 5\' 7"  (1.702 m), SpO2 100%.     General: awake, alert, appropriate, sitting up in w/c at bedside; eating breakfast;  NAD HENT: conjugate gaze; oropharynx moist CV: regular rate and rhythm; no JVD Pulmonary: CTA B/L; no W/R/R- good air movement GI: soft, NT, ND, (+)BS- slightly hypoactive Psychiatric: appropriate- very interactive Neurological: Ox3 Skin- dry dressings on front and back -d rain out- dressings C/D/I  MSK: able to move all extremities antigravity  PRIOR EXAMS: Neurological:     Mental Status: He is alert.     Comments: Alert and oriented x 3. Normal insight and awareness. Intact Memory. Normal language and speech. Cranial nerve exam unremarkable. MMT:  UE grossly 4/5 prox to distal, perhaps a bit of HI weakness. BLE 4/5 prox to distal. Decreased LT in finger tips of both  hands. DTR's 1+. No abnl resting tone.   Assessment/Plan: 1. Functional deficits which require 3+ hours per day of interdisciplinary therapy in a comprehensive inpatient rehab setting. Physiatrist is providing close team supervision and 24 hour management of active medical problems listed below. Physiatrist and rehab team continue to assess barriers to discharge/monitor patient progress toward functional and medical goals  Care Tool:  Bathing    Body parts bathed by patient: (P) Right arm, Left arm, Chest, Abdomen, Front perineal area, Right upper leg, Left upper leg, Face, Right lower leg, Left lower leg, Buttocks   Body parts bathed by helper: Buttocks, Right lower leg, Left lower leg     Bathing assist Assist Level: (P) Contact Guard/Touching assist     Upper Body Dressing/Undressing Upper body dressing   What is the patient wearing?: (P) Pull over shirt    Upper body assist Assist Level: (P) Contact Guard/Touching assist    Lower Body Dressing/Undressing Lower body dressing      What is the patient  wearing?: (P) Underwear/pull up, Pants     Lower body assist Assist for lower body dressing: (P) Supervision/Verbal cueing     Toileting Toileting    Toileting assist Assist for toileting: Minimal Assistance - Patient > 75%     Transfers Chair/bed transfer  Transfers assist     Chair/bed transfer assist level: Minimal Assistance - Patient > 75%     Locomotion Ambulation   Ambulation assist      Assist level: Minimal Assistance - Patient > 75% Assistive device: Walker-rolling Max distance: 80   Walk 10 feet activity   Assist     Assist level: Minimal Assistance - Patient > 75% Assistive device: Walker-rolling   Walk 50 feet activity   Assist Walk 50 feet with 2 turns activity did not occur: Safety/medical concerns  Assist level: Minimal Assistance - Patient > 75% Assistive device: Walker-rolling    Walk 150 feet activity   Assist Walk  150 feet activity did not occur: Safety/medical concerns         Walk 10 feet on uneven surface  activity   Assist     Assist level: Minimal Assistance - Patient > 75% (ramp) Assistive device: Walker-rolling   Wheelchair     Assist Is the patient using a wheelchair?: Yes Type of Wheelchair: Manual    Wheelchair assist level: Supervision/Verbal cueing Max wheelchair distance: 100    Wheelchair 50 feet with 2 turns activity    Assist        Assist Level: Supervision/Verbal cueing   Wheelchair 150 feet activity     Assist      Assist Level: Moderate Assistance - Patient 50 - 74%   Blood pressure 132/81, pulse 79, temperature 98.6 F (37 C), temperature source Oral, resp. rate 18, height 5\' 7"  (1.702 m), SpO2 100%.  Medical Problem List and Plan: 1. Functional deficits secondary to cervical stenosis/myelopathy status post C6-7 ACDF and C2-4 laminectomies, C2-T2 PSF -has history of C3-C6 ACDF 12/26/2020. -NO BRACE REQUIRED.Marland Kitchen   -patient may not shower until Drain removed, then can shower             -ELOS/Goals: 14-16 days, supervision to min assist goals with PT, OT    D/c 3/21  Con't CIR PT and OT pt having a lot of neck pain- will order soft collar AS NEEDED for comfort 2.  Antithrombotics: -DVT/anticoagulation:  Pharmaceutical: Lovenox 40mg  daily             -antiplatelet therapy: N/A  -05/04/23 vascular U/S neg for DVTs 3. Pain Management: Neurontin 300 mg 3 times daily; Tylenol, Robaxin, and oxycodone as needed -05/05/23 pt requesting robaxin be changed to flexeril-- ordered 10mg  TID PRN 3/18- changed tylenol to 325 mg q4 hours prn- and con't Oxycodone- is q3 hours prn- will try to wean over next few days 3/19- said trying to take tylenol sometimes instead of Oxycodone, but hard to do at night 4. Mood/Behavior/Sleep: Provide emotional support             -antipsychotic agents: N/A -05/04/23 didn't sleep well but perseverates on other issues so  doesn't really tell me if he wants anything for this; monitor for now -05/05/23 poor sleep again, agreeable with trying melatonin 5mg  PRN 3/17- slept a little better 5. Neuropsych/cognition: This patient is capable of making decisions on his own behalf. 6. Skin/Wound Care: Routine skin checks -continue posterior surgical site drain until output is less than 100 cc over 24 hours or less than 50 cc over  12 hours.  -drain became disconnected during transfer from [facility]. Tubing was reconnected and I recharged the hemovac container. Observe tonight for functionality 3/17- is draining- however very little in hemovac- called Dr Katrinka Blazing and if no more than 100cc in last 24 hours, can remove- will double check with nursing first to see when emptied.  3/18- Dr Katrinka Blazing Ok'd removal- will try to have done today 3/19- drain out- took shower 7. Fluids/Electrolytes/Nutrition: Routine I&O's with follow-up chemistries -05/04/23 pt very adamant that he be on regular diet; on carb mod, explained rationale behind this. Pt agreeable in the end.  8.  BPH.  Uroxatrol 10 mg daily, Cialis 2.5 mg daily (not restarted?), continue prophylactic Macrobid 100 mg daily 9.  Constipation.  Senokot 1 tablet twice daily, Colace 100 mg twice daily ---> change to senokot-s 2 qhs             -LBM 3/11 -pt says that he feels like he needs to empty. Will have dulcolax suppository available as needed. Sorbitol prn also.  -05/04/23 no BM since Monday, will start miralax BID, colace BID, continue senokot 2 tabs nightly, and use sorbitol 30ml once now.  -05/05/23 still no BM, wants to try sorbitol 60ml one more time today; cont other meds; monitor closely 3/17- Will order Sorbitol 60cc after therapy and then soap suds enema per pt request at 6pm if no BM- should make him go 3/18- LBM yesterday 3/19- LBM 2 days ago- doesn't feel constipated 10.  Diabetes mellitus.  Latest hemoglobin A1c 6.1, recheck Monday.  Patient on Mounjaro 10 mg weekly prior to  admission. SSI, CBG checks.  -pt states that family will bring it in. -05/04/23 pt upset that he's on SSI, explained why; waiting on mounjaro to be brought in as well; for now continue SSI; CBGs are ok.  -05/05/23 mounjaro in for Friday 3/21; CBGs look great; monitor 3/17- will stop CBGs and 3/19- d/w nursing that Cbgs and SSI were d/c'd on 3/17 CBG (last 3)  Recent Labs    05/07/23 1140 05/07/23 1640 05/07/23 1703  GLUCAP 105* 67* 103*    11.  Primary open-angle glaucoma left eye.  Continue eyedrops 12. ABLA: Hgb 9.0 on 05/04/23, relatively stable; monitor  3/17- Hb 8.8- overall stable- con't to monitor 13. Leukocytosis:  -05/04/23 was 11.4 on 3/14, up to 13.1 on 3/15, but did take decadron recently... monitor on Monday, no s/sx of infection at this point 3/17- WBC down to 10.3 - no signs of infection 14. GERD: continue protonix 40mg  daily 15. HTN  3/17- pt on Cialis at home 5 mg 3x/week- willing to restart for HTN, since BP not controlled- pt says "has tried everything" in past.   3/18- BP doing much better this AM- con't regimen  3/19- BP still controlled   LOS: 5 days A FACE TO FACE EVALUATION WAS PERFORMED  Caleb Wall 05/08/2023, 8:32 AM

## 2023-05-08 NOTE — Progress Notes (Signed)
 Physical Therapy Session Note  Patient Details  Name: Caleb Wall MRN: [id number] Date of Birth: 03-13-1955  Today's Date: 05/07/2023 PT Individual Time: 1510-1619  PT Individual Time Calculation (min): 69 min   Short Term Goals: Week 1:  PT Short Term Goal 1 (Week 1): STG =LTG 2/2 ELOS   Skilled Therapeutic Interventions/Progress Updates:  Patient seated upright in recliner on entrance to room. Wife present. Patient alert and agreeable to PT session. Relates that prior to session, he has requested pain medication. RN acquiring all medications due at start of session and provided to pt.   Patient with minimal pain complaint at start of session. Relates pain from surgical procedure at 6/10 prior to donning cervical collar, then 4/10.   Pt performed sit<>stand and stand pivot transfers throughout session with supervision either from therapist or wife. Provided vc/ tc for safe positioning.  Pt ambulated 75 ft using RW with supervision. Provided vc/ tc for upright posture, reduced UE pressure into walker.   Pt and wife relate home environment and with questions re: shower transfer. Pt relates having shower stool in shower already and position of safety rails. Shower stool placed in shower. Guided in position to grasp rails with BUE. Pt initially with difficulty in stepping over tub with shoes donned. Catches shoe on height of tub wall. Doffs shoes and is able to lift feet high enough to transfer into tub. Improves quality with blocked practice. Performs x10  with CGA improving to close supervision from therapist and then from wife.  Both relate home equipment available don't believe they have any further needs.   Wheelchair Mobility:  Pt propelled wheelchair 75 feet with supervision. Provided vc/ tc for maintaining minimal push in order to reduce potential straining in thoracic/ cervical musculature. Coordinated BLE assist to propel and navigate turns.   Pt very fatigued at end of session  after long day and following shower. Pt performed sit > supine with supervision/ Mod I. No cueing required for technique, but required to complete to neutral positioning. Quick to fall asleep.   Patient supine in bed at end of session with brakes locked, bed alarm set, and all needs within reach.   Therapy Documentation Precautions:  Precautions Precautions: Cervical, Other (comment) (hemovac drain.) Precaution/Restrictions Comments: No cervical brace ordered. Restrictions Weight Bearing Restrictions Per Provider Order: No  Pain: Pt related unrated but rising pain in posterior neck with time in standing. Soft collar donned and pain decreases per pt.   Therapy/Group: Individual Therapy  Loel Dubonnet PT, DPT, CSRS 05/07/2023, 6:21 PM

## 2023-05-08 NOTE — Plan of Care (Signed)
  Problem: Education: Goal: Ability to describe self-care measures that may prevent or decrease complications (Diabetes Survival Skills Education) will improve Outcome: Progressing Goal: Individualized Educational Video(s) Outcome: Progressing   Problem: Coping: Goal: Ability to adjust to condition or change in health will improve Outcome: Progressing   Problem: Fluid Volume: Goal: Ability to maintain a balanced intake and output will improve Outcome: Progressing   Problem: Health Behavior/Discharge Planning: Goal: Ability to identify and utilize available resources and services will improve Outcome: Progressing Goal: Ability to manage health-related needs will improve Outcome: Progressing   Problem: Nutritional: Goal: Maintenance of adequate nutrition will improve Outcome: Progressing Goal: Progress toward achieving an optimal weight will improve Outcome: Progressing   Problem: Tissue Perfusion: Goal: Adequacy of tissue perfusion will improve Outcome: Progressing   Problem: Consults Goal: RH SPINAL CORD INJURY PATIENT EDUCATION Description:  See Patient Education module for education specifics.  Outcome: Progressing Goal: Skin Care Protocol Initiated - if Braden Score 18 or less Description: If consults are not indicated, leave blank or document N/A Outcome: Progressing Goal: Nutrition Consult-if indicated Outcome: Progressing Goal: Diabetes Guidelines if Diabetic/Glucose > 140 Description: If diabetic or lab glucose is > 140 mg/dl - Initiate Diabetes/Hyperglycemia Guidelines & Document Interventions  Outcome: Progressing

## 2023-05-08 NOTE — Progress Notes (Signed)
 Physical Therapy Session Note  Patient Details  Name: Caleb Wall MRN: [id number] Date of Birth: 1955/03/06  Today's Date: 05/08/2023 PT Individual Time: 1100-1203 PT Individual Time Calculation (min): 63 min   Short Term Goals: Week 1:  PT Short Term Goal 1 (Week 1): STG =LTG 2/2 ELOS  Skilled Therapeutic Interventions/Progress Updates:      Direct handoff from OT - pt sitting EOB and agreeable to therapy session. Pt has no c/o of increased pain, notes he is somewhat fatigued from OT session. Pt notes intermittent neck pain during session, provided positioning changes and rest breaks.   Pt ambulated from room to therapy gym (~100') with RW and SBA, verbal cues for increased R knee flexion and R stance time.   Stair training: leading with RLE ascending, LUE descending to improve RLE concentric and eccentric strength, pt ambulated 50' before and after each bout before seated rest break - x4 steps SBA, LUE support, verbal cues for eccentric control - x8 steps while holding bag with 5" weight to simulate grocery bag, CGA, verbal cues to descend one step at a time, LUE support  Pt ambulated 140' to dayroom with RW, same verbal cues as above, some improved R knee flexion.   Agility ladder for balance training and to improve R foot clearance: CGA - x4 - 2 feet in each square alternating leading LE - x2 - 1 foot in each square, pt had difficulty taking steps large enough for this - x1 - side stepping leading with L, pt had difficulty clearing RLE, d/c for safety  Pt ambulated 250' back to room with improved R foot clearance, verbal cues to keep RW closer when ambulating over thresholds. Pt left seated in recliner with seat belt alarm on and all needs in reach.   Therapy Documentation Precautions:  Precautions Precautions: Cervical, Other (comment) (hemovac drain.) Precaution/Restrictions Comments: No cervical brace ordered. Restrictions Weight Bearing Restrictions Per Provider Order:  No General:      Therapy/Group: Individual Therapy  [name] 05/08/2023, 12:54 PM

## 2023-05-08 NOTE — Progress Notes (Signed)
 Occupational Therapy Session Note  Patient Details  Name: Caleb Wall MRN: [id number] Date of Birth: 12-Nov-1955  Today's Date: 05/08/2023 OT Individual Time: 0945-1100 & 3235-5732 OT Individual Time Calculation (min): 75 min & 76 min    Short Term Goals: Week 1:  OT Short Term Goal 1 (Week 1): STGs=LTGs due to patient's estimated length of stay.  Skilled Therapeutic Interventions/Progress Updates:      Therapy Documentation Precautions:  Precautions Precautions: Cervical, Other (comment) (hemovac drain.) Precaution/Restrictions Comments: No cervical brace ordered. Restrictions Weight Bearing Restrictions Per Provider Order: No Session 1 General: "I took a shower yesterday" Pt supine in bed upon OT arrival, agreeable to OT session.  Pain:  7/10 pain reported in neck, activity, intermittent rest breaks, distractions provided for pain management, pt reports tolerable to proceed.   ADL:  Pt completing ADL activities in order to increase independence for ADL tasks, dynamic balance and activity tolerance. Pt completed the following tasks with current levels of assist: Bed mobility: distant supervision with log roll supine><EOB Grooming: supervision in standing for shaving with no LOB/SOB with good safety with cervical precautions UB dressing: supervision in sitting for donning/doffing overhead shirt LB dressing: supervision with VC for figure 4 method with increased independence, standing unsupported for managing over waist Footwear: donning socks and shoes in figure 4 method with increased independence with increased time Transfers: supervision with RW for functional mobility around room and around unit with no LOB/SOB  Exercises: Pt demonstrated community mobility with RW in order to prepare for community integration at D/C completing functional mobility from therapy unit to outdoors for increased activity tolerance . Pt requiring distant supervision with one rest break. Pt  practicing maneuvering into elevators, pulling into table tops, maneuvering around obstacles and propelling at advanced distances. OT discussing community bathroom safety d/t pt requiring use of mens bathroom. Pt able to use community bathroom at distant supervision with RW to void urine.    Pt seated in recliner at end of session with W/C alarm donned, call light within reach and 4Ps assessed.     Session 2 General: "I feel stronger" Pt seated in recliner upon OT arrival, agreeable to OT.  Pain: unrated pain reported in neck, activity, intermittent rest breaks, distractions provided for pain management, pt reports tolerable to proceed.   ADL: OT providing therapeutic intervention with ADLs in order to increase independence with toileting. Pt able to ambulate to toilet with RW at distant supervision and complete toileting in standing to void urine with safe techniques and no LOB/SOB. Pt completed task 2x during session.  Exercises: Pt completed the following exercise circuit in order to improve functional activity, strength and endurance to prepare for ADLs such as bathing. Pt completed the following exercises in seated/standing position with no noted LOB/SOB and 3x5 repetitions on each exercise: -sit to stands with 4.4# medicine ball  -chest press in standing with 4.4# medicine ball Pt requiring rest breaks between sets for energy conservation, pt required VC for direction to task   Other Treatments: OT provided therapeutic soft tissue massage in order to decrease muscle tightness in upper trapezius muscle. Pt reported decreased muscle tightness after soft tissue massage. Pt curious of cervical spine imaging, OT providing insight for imaging.    Pt seated in recliner at end of session with W/C alarm donned, call light within reach and 4Ps assessed.    Therapy/Group: Individual Therapy  Velia Meyer, OTD, OTR/L 05/08/2023, 3:40 PM

## 2023-05-09 ENCOUNTER — Encounter: Payer: Self-pay | Admitting: Neurosurgery

## 2023-05-09 ENCOUNTER — Other Ambulatory Visit (HOSPITAL_COMMUNITY): Payer: Self-pay

## 2023-05-09 MED ORDER — ALFUZOSIN HCL ER 10 MG PO TB24
10.0000 mg | ORAL_TABLET | Freq: Every day | ORAL | 0 refills | Status: DC
  Filled 2023-05-09: qty 30, 30d supply, fill #0

## 2023-05-09 MED ORDER — PANTOPRAZOLE SODIUM 40 MG PO TBEC
40.0000 mg | DELAYED_RELEASE_TABLET | Freq: Every day | ORAL | 0 refills | Status: AC
  Filled 2023-05-09: qty 30, 30d supply, fill #0

## 2023-05-09 MED ORDER — OXYCODONE HCL 10 MG PO TABS
10.0000 mg | ORAL_TABLET | ORAL | 0 refills | Status: DC | PRN
  Filled 2023-05-09: qty 30, 5d supply, fill #0

## 2023-05-09 MED ORDER — SORBITOL 70 % SOLN
30.0000 mL | Freq: Once | Status: AC
  Administered 2023-05-09: 30 mL via ORAL
  Filled 2023-05-09: qty 30

## 2023-05-09 MED ORDER — BISACODYL 10 MG RE SUPP
10.0000 mg | Freq: Once | RECTAL | Status: AC
  Administered 2023-05-09: 10 mg via RECTAL
  Filled 2023-05-09: qty 1

## 2023-05-09 MED ORDER — CYCLOBENZAPRINE HCL 10 MG PO TABS
10.0000 mg | ORAL_TABLET | Freq: Three times a day (TID) | ORAL | 0 refills | Status: DC | PRN
Start: 2023-05-09 — End: 2023-05-15
  Filled 2023-05-09: qty 60, 20d supply, fill #0

## 2023-05-09 MED ORDER — NITROFURANTOIN MONOHYD MACRO 100 MG PO CAPS
100.0000 mg | ORAL_CAPSULE | Freq: Every day | ORAL | 0 refills | Status: DC
Start: 2023-05-09 — End: 2023-09-26
  Filled 2023-05-09: qty 30, 30d supply, fill #0

## 2023-05-09 MED ORDER — GABAPENTIN 300 MG PO CAPS
300.0000 mg | ORAL_CAPSULE | Freq: Three times a day (TID) | ORAL | 0 refills | Status: DC
  Filled 2023-05-09: qty 90, 30d supply, fill #0

## 2023-05-09 NOTE — Progress Notes (Signed)
 Inpatient Rehabilitation Discharge Medication Review by a Pharmacist  A complete drug regimen review was completed for this patient to identify any potential clinically significant medication issues.  High Risk Drug Classes Is patient taking? Indication by Medication  Antipsychotic No   Anticoagulant No   Antibiotic Yes PO nitrofurantoin - UTI ppx  Opioid Yes Oxycodone prn pain  Antiplatelet No   Hypoglycemics/insulin Yes Mounjaro - DM  Vasoactive Medication Yes Alfuzosin - BPH  Chemotherapy No   Other Yes Acetaminophen- pain Brimonidine, Cosopt, Latanoprost-glaucoma Cialis- BPH Colace, miralax- constipation Cyclobenzaprine- muscle spasms Gabapentin - pain Pantoprazole - reflux     Type of Medication Issue Identified Description of Issue Recommendation(s)  Drug Interaction(s) (clinically significant)     Duplicate Therapy     Allergy     No Medication Administration End Date     Incorrect Dose     Additional Drug Therapy Needed     Significant med changes from prior encounter (inform family/care partners about these prior to discharge).  PTA medications: Bisacodyl,  BC headache powder and methocarbamol were discontinued.  Communicate to patient /family/ caregiver prior to discharge. It is noted to stop taking in the discharge AVS.    Other       Clinically significant medication issues were identified that warrant physician communication and completion of prescribed/recommended actions by midnight of the next day:  No  Name of provider notified for urgent issues identified:   Provider Method of Notification:    Pharmacist comments: None  Time spent performing this drug regimen review (minutes):  20 minutes     Noah Delaine, RPh Clinical Pharmacist 05/09/2023 4:03 PM

## 2023-05-09 NOTE — Progress Notes (Signed)
 Physical Therapy Session Note  Patient Details  Name: Caleb Wall MRN: [id number] Date of Birth: 02/05/1956  Today's Date: 05/09/2023 PT Individual Time: 1120-1208 PT Individual Time Calculation (min): 48 min   Short Term Goals: Week 1:  PT Short Term Goal 1 (Week 1): STG =LTG 2/2 ELOS  Skilled Therapeutic Interventions/Progress Updates: Pt presented sitting EOB agreeable to therapy. Pt c/o unrated pain in neck however states premedicated. Session focused on performing HEP and ambulation. Pt ambulated to day room with supervision fading to mod I (therapist had not worked with pt previously). Pt then participated in the following:  Sit to stand (performed with 5lb barbell) 2 x 10 Standing march with single UE support 2 x 10 Standing hamstring curls 2 x 10 Sidestepping 26ft x 6 Toe taps to 4in step (agreed to use 4lb weight for increased hip flexor recruitment) 2 x 10  Pt required intermittent cues for technique but able to perform overall successfully. Pt then ambulated back to room in same manner as prior. Pt returned to recliner at end of session and left with call bell within reach and needs met.      Therapy Documentation Precautions:  Precautions Precautions: Cervical Precaution/Restrictions Comments: soft collar for comfort Restrictions Weight Bearing Restrictions Per Provider Order: No General:   Vital Signs: Therapy Vitals Temp: 98.8 F (37.1 C) Temp Source: Oral Pulse Rate: 88 Resp: 18 BP: (!) 136/92 Patient Position (if appropriate): Sitting Oxygen Therapy SpO2: 93 % O2 Device: Room Air Pain: Pain Assessment Pain Scale: 0-10 Pain Score: 7  Pain Type: Surgical pain Pain Location: Neck Pain Orientation: Posterior Pain Radiating Towards: shoulder Pain Descriptors / Indicators: Aching Pain Frequency: Constant Pain Onset: On-going Pain Intervention(s): Medication (See eMAR) Therapy/Group: Individual Therapy    Joni Colegrove 05/09/2023, 4:07 PM

## 2023-05-09 NOTE — Progress Notes (Signed)
 Physical Therapy Session Note  Patient Details  Name: Caleb Wall MRN: [id number] Date of Birth: May 18, 1955  Today's Date: 05/09/2023 PT Individual Time: 0805-0900 PT Individual Time Calculation (min): 55 min   Short Term Goals: Week 1:  PT Short Term Goal 1 (Week 1): STG =LTG 2/2 ELOS  Skilled Therapeutic Interventions/Progress Updates:    Pt received in recliner and agreeable to therapy.  Pt reports 5/10 pain at rest and up to 7 with activity, premedicated. Rest and positioning provided as needed.   Pt requesting toileting and morning wash up. ambulatory transfer and standing urination with supervision, peri hygiene at sink with supervision to CGA. Therapist provided assist with gently cleansing incision area with clean washcloth. Pt able to maintain balance and standing for endurance and activity tolerance with supervision overall. No assist needed for clothing management, just cues for safety (not standing in regular socks and maintaining cervical precautions).   Pt then ambulated to therapy gym and was instructed in HEP as follows: Access Code: [facility] URL: [email or web address] Date: 05/09/2023 Prepared by: Bernie Covey  Exercises - Squat with Chair Touch  - 1 x daily - 7 x weekly - 3 sets - 10 reps - Standing March with Counter Support  - 1 x daily - 7 x weekly - 3 sets - 10 reps - Standing Knee Flexion with Unilateral Counter Support  - 1 x daily - 7 x weekly - 3 sets - 10 reps - Side Stepping with Counter Support  - 1 x daily - 7 x weekly - 3 sets - 10 reps - Step Taps with Unilateral Counter Support  - 1 x daily - 7 x weekly - 3 sets - 10 reps  Pt performed squat and marches with RW and supervision,but time was insufficient to complete HEP practice, so communicated with pt's next therapist.   Pt navigated ramp with supervision for dynamic balance to prepare for for community ambulation, mulch and curb with CGA d/t unfamiliarity with pt. Pt returned to  room and to recliner, was left with all needs in reach.   Therapy Documentation Precautions:  Precautions Precautions: Cervical Precaution/Restrictions Comments: soft collar for comfort Restrictions Weight Bearing Restrictions Per Provider Order: No General:      Therapy/Group: Individual Therapy  Juluis Rainier 05/09/2023, 8:26 AM

## 2023-05-09 NOTE — Progress Notes (Addendum)
 Patient ID: Caleb Wall, male   DOB: 11-Mar-1955, 68 y.o.   MRN: [id number]  Have sent referral to [facility] where pt prefers to go due to goes there for PCP. They will call him to set upo follow up appointments. Has rolling walker from [employer].  2:39 PM According to pt Gavin Potters can not get him in until 4/24. Have sent referral to [facility] and contacted them they can get him in next week. Pt aware of this plan

## 2023-05-09 NOTE — Progress Notes (Signed)
 Physical Therapy Discharge Summary  Patient Details  Name: Caleb Wall MRN: [id number] Date of Birth: 05/22/1955  Date of Discharge from PT service:May 09, 2023  Today's Date: 05/09/2023 PT Individual Time: 1001-1048 PT Individual Time Calculation (min): 47 min    Patient has met 8 of 8 long term goals due to improved activity tolerance, improved balance, increased strength, and decreased pain.  Patient to discharge at an ambulatory level Modified Independent.    Reasons goals not met: N/A  Recommendation:  Patient will benefit from ongoing skilled PT services in outpatient setting to continue to advance safe functional mobility, address ongoing impairments in endurance deficits, balance, RLE strength, and minimize fall risk.  Equipment: RW  Reasons for discharge: treatment goals met  Patient/family agrees with progress made and goals achieved: Yes  PT Discharge Precautions/Restrictions Precautions Precautions: Cervical Precaution Booklet Issued: No Recall of Precautions/Restrictions: Intact Precaution/Restrictions Comments: soft collar for comfort Restrictions Weight Bearing Restrictions Per Provider Order: No Pain Interference Pain Interference Pain Effect on Sleep: 3. Frequently Pain Interference with Therapy Activities: 2. Occasionally Pain Interference with Day-to-Day Activities: 2. Occasionally Vision/Perception  Vision - History Ability to See in Adequate Light: 1 Impaired  Cognition Overall Cognitive Status: Within Functional Limits for tasks assessed Arousal/Alertness: Awake/alert Orientation Level: Oriented X4 Sensation Sensation Light Touch: Appears Intact Motor  Motor Motor: Within Functional Limits Motor - Discharge Observations: improved R knee strength, continues to display weakness functionall compared to L during gait  Mobility Bed Mobility Bed Mobility: Right Sidelying to Sit;Sit to Sidelying Left;Rolling Left;Rolling Right Rolling Right:  Independent with assistive device Rolling Left: Independent with assistive device Right Sidelying to Sit: Independent with assistive device Sit to Sidelying Left: Independent with assistive device Transfers Transfers: Sit to Stand;Stand to Sit;Stand Pivot Transfers Sit to Stand: Independent with assistive device Stand to Sit: Independent with assistive device Stand Pivot Transfers: Independent with assistive device Transfer (Assistive device): Rolling walker Locomotion  Gait Ambulation: Yes Gait Assistance: Independent with assistive device Gait Distance (Feet): 300 Feet Assistive device: Rolling walker Gait Gait: Yes Gait Pattern: Step-through pattern;Decreased step length - left;Decreased stance time - right Gait velocity: decreased Stairs / Additional Locomotion Stairs: Yes Stairs Assistance: Independent with assistive device Stair Management Technique: One rail Left Number of Stairs: 12 Height of Stairs: 6 Ramp: Independent with assistive device Curb: Independent with assistive device  Trunk/Postural Assessment  Cervical Assessment Cervical Assessment: Exceptions to [facility] (forward head, decreased ROM d/t cervical precautions) Thoracic Assessment Thoracic Assessment: Within Functional Limits Lumbar Assessment Lumbar Assessment: Within Functional Limits Postural Control Postural Control: Within Functional Limits  Balance Balance Balance Assessed: Yes Static Sitting Balance Static Sitting - Balance Support: Feet supported Static Sitting - Level of Assistance: 6: Modified independent (Device/Increase time) Dynamic Sitting Balance Dynamic Sitting - Balance Support: Feet supported Dynamic Sitting - Level of Assistance: 6: Modified independent (Device/Increase time) Static Standing Balance Static Standing - Balance Support: Bilateral upper extremity supported Static Standing - Level of Assistance: 6: Modified independent (Device/Increase time) Dynamic Standing  Balance Dynamic Standing - Balance Support: Bilateral upper extremity supported Dynamic Standing - Level of Assistance: 6: Modified independent (Device/Increase time) Dynamic Standing - Balance Activities: Lateral lean/weight shifting Extremity Assessment      RLE Assessment RLE Assessment: Within Functional Limits General Strength Comments: decr compared to LLE LLE Assessment LLE Assessment: Within Functional Limits  Skilled Therapeutic Interventions/Progress Updates Pt received seated in recliner and agreeable to therapy session. Pt notes unrated neck pain but states he would prefer  to take pain medication and end of session.   Pt ambulated with RW and supervision to hallway, pt displays improved proximity to RW while ambulating over thresholds compared to previous sessions. Pt performed 2x25' side stepping at railing with SBA to improve dynamic balance and lateral hip strength and endurance, 1st bout with UE support, 2nd without, pt given verbal cues to keep toes facing forward. Discussing adding this to HEP.   Pt performed x5 half kneeling to stand with each leg as precursor to floor transfer, with CGA fading to SBA, BUE support with RLE in front, RUE support with LUE in front.   Pt ambulated back to room from therapy gym with supervision.Discussed d/c and all of pt's questions answered. Pt left in care of nsg staff administering pain medication, all pt's needs met.   [name] 05/09/2023, 4:25 PM

## 2023-05-09 NOTE — Plan of Care (Signed)
  Problem: RH Balance Goal: LTG Patient will maintain dynamic standing with ADLs (OT) Description: LTG:  Patient will maintain dynamic standing balance with assist during activities of daily living (OT)  Outcome: Completed/Met Flowsheets (Taken 05/04/2023 1257 by Isabella Stalling, OT) LTG: Pt will maintain dynamic standing balance during ADLs with: Independent with assistive device   Problem: RH Eating Goal: LTG Patient will perform eating w/assist, cues/equip (OT) Description: LTG: Patient will perform eating with assist, with/without cues using equipment (OT) Outcome: Completed/Met Flowsheets (Taken 05/04/2023 1257 by Isabella Stalling, OT) LTG: Pt will perform eating with assistance level of: Independent with assistive device    Problem: RH Grooming Goal: LTG Patient will perform grooming w/assist,cues/equip (OT) Description: LTG: Patient will perform grooming with assist, with/without cues using equipment (OT) Outcome: Completed/Met Flowsheets (Taken 05/04/2023 1257 by Isabella Stalling, OT) LTG: Pt will perform grooming with assistance level of: Independent with assistive device    Problem: RH Bathing Goal: LTG Patient will bathe all body parts with assist levels (OT) Description: LTG: Patient will bathe all body parts with assist levels (OT) Outcome: Completed/Met Flowsheets (Taken 05/04/2023 1257 by Isabella Stalling, OT) LTG: Pt will perform bathing with assistance level/cueing: Independent with assistive device    Problem: RH Dressing Goal: LTG Patient will perform upper body dressing (OT) Description: LTG Patient will perform upper body dressing with assist, with/without cues (OT). Outcome: Completed/Met Flowsheets (Taken 05/04/2023 1257 by Isabella Stalling, OT) LTG: Pt will perform upper body dressing with assistance level of: Independent with assistive device Goal: LTG Patient will perform lower body dressing w/assist (OT) Description: LTG: Patient  will perform lower body dressing with assist, with/without cues in positioning using equipment (OT) Outcome: Completed/Met Flowsheets (Taken 05/04/2023 1257 by Isabella Stalling, OT) LTG: Pt will perform lower body dressing with assistance level of: Independent with assistive device   Problem: RH Toileting Goal: LTG Patient will perform toileting task (3/3 steps) with assistance level (OT) Description: LTG: Patient will perform toileting task (3/3 steps) with assistance level (OT)  Outcome: Completed/Met Flowsheets (Taken 05/04/2023 1257 by Isabella Stalling, OT) LTG: Pt will perform toileting task (3/3 steps) with assistance level: Independent with assistive device   Problem: RH Functional Use of Upper Extremity Goal: LTG Patient will use RT/LT upper extremity as a (OT) Description: LTG: Patient will use right/left upper extremity as a stabilizer/gross assist/diminished/nondominant/dominant level with assist, with/without cues during functional activity (OT) Outcome: Completed/Met Flowsheets (Taken 05/04/2023 1257 by Isabella Stalling, OT) LTG: Use of upper extremity in functional activities:  RUE as nondominant level  LUE as nondominant level   Problem: RH Toilet Transfers Goal: LTG Patient will perform toilet transfers w/assist (OT) Description: LTG: Patient will perform toilet transfers with assist, with/without cues using equipment (OT) Outcome: Completed/Met Flowsheets (Taken 05/04/2023 1257 by Isabella Stalling, OT) LTG: Pt will perform toilet transfers with assistance level of: Independent with assistive device   Problem: RH Tub/Shower Transfers Goal: LTG Patient will perform tub/shower transfers w/assist (OT) Description: LTG: Patient will perform tub/shower transfers with assist, with/without cues using equipment (OT) Outcome: Completed/Met Flowsheets (Taken 05/09/2023 1418) LTG: Pt will perform tub/shower stall transfers with assistance level of: Independent  with assistive device

## 2023-05-09 NOTE — Progress Notes (Signed)
 PROGRESS NOTE   Subjective/Complaints:   Pt reports no BM in 3 days -wants sorbitol around same time as Miralax- this Am and then suppository this afternoon.  Thinks has hx of blood clot- nursing told him, per pt, that didn't need Lovenox anymore since walking so much- I explained I would rather him continue, and explained why, until d/c tomorrow.   Asking about f/u after d/c.   Surgery was 7-8 days ago.   ROS:    Pt denies SOB, abd pain, CP, N/V/C/D, and vision changes    Objective:   No results found.  No results for input(s): "WBC", "HGB", "HCT", "PLT" in the last 72 hours.  No results for input(s): "NA", "K", "CL", "CO2", "GLUCOSE", "BUN", "CREATININE", "CALCIUM" in the last 72 hours.        Intake/Output Summary (Last 24 hours) at 05/09/2023 0842 Last data filed at 05/09/2023 0400 Gross per 24 hour  Intake 480 ml  Output 1850 ml  Net -1370 ml        Physical Exam: Vital Signs Blood pressure (!) 141/83, pulse 87, temperature 98.3 F (36.8 C), resp. rate 18, height 5\' 7"  (1.702 m), SpO2 98%.      General: awake, alert, appropriate,  sitting up in bedside chair in room; finished 100% tray; NAD HENT: conjugate gaze; oropharynx moist- Posterior neck incision has staples- no drainage seen. Dressing C/D/I CV: regular rate and rhythm; no JVD Pulmonary: CTA B/L; no W/R/R- good air movement GI: soft, NT, distended vs protuberant; hypoactive BS Psychiatric: appropriate Neurological: Ox3   MSK: able to move all extremities antigravity  PRIOR EXAMS: Neurological:     Mental Status: He is alert.     Comments: Alert and oriented x 3. Normal insight and awareness. Intact Memory. Normal language and speech. Cranial nerve exam unremarkable. MMT:  UE grossly 4/5 prox to distal, perhaps a bit of HI weakness. BLE 4/5 prox to distal. Decreased LT in finger tips of both hands. DTR's 1+. No abnl resting  tone.   Assessment/Plan: 1. Functional deficits which require 3+ hours per day of interdisciplinary therapy in a comprehensive inpatient rehab setting. Physiatrist is providing close team supervision and 24 hour management of active medical problems listed below. Physiatrist and rehab team continue to assess barriers to discharge/monitor patient progress toward functional and medical goals  Care Tool:  Bathing    Body parts bathed by patient: Right arm, Left arm, Chest, Abdomen, Front perineal area, Right upper leg, Left upper leg, Face, Buttocks, Right lower leg, Left lower leg   Body parts bathed by helper: Buttocks, Right lower leg, Left lower leg     Bathing assist Assist Level: Independent with assistive device     Upper Body Dressing/Undressing Upper body dressing   What is the patient wearing?: Pull over shirt    Upper body assist Assist Level: Independent with assistive device    Lower Body Dressing/Undressing Lower body dressing      What is the patient wearing?: Underwear/pull up, Pants     Lower body assist Assist for lower body dressing: Independent with assitive device     Toileting Toileting    Toileting assist Assist for toileting:  Independent with assistive device     Transfers Chair/bed transfer  Transfers assist     Chair/bed transfer assist level: Independent with assistive device     Locomotion Ambulation   Ambulation assist      Assist level: Independent with assistive device Assistive device: Walker-rolling Max distance: 250   Walk 10 feet activity   Assist     Assist level: Independent with assistive device Assistive device: Walker-rolling   Walk 50 feet activity   Assist Walk 50 feet with 2 turns activity did not occur: Safety/medical concerns  Assist level: Independent with assistive device Assistive device: Walker-rolling    Walk 150 feet activity   Assist Walk 150 feet activity did not occur: Safety/medical  concerns  Assist level: Independent with assistive device Assistive device: Walker-rolling    Walk 10 feet on uneven surface  activity   Assist     Assist level: Minimal Assistance - Patient > 75% (ramp) Assistive device: Walker-rolling   Wheelchair     Assist Is the patient using a wheelchair?: Yes Type of Wheelchair: Manual    Wheelchair assist level: Independent Max wheelchair distance: 150    Wheelchair 50 feet with 2 turns activity    Assist        Assist Level: Independent   Wheelchair 150 feet activity     Assist      Assist Level: Independent   Blood pressure (!) 141/83, pulse 87, temperature 98.3 F (36.8 C), resp. rate 18, height 5\' 7"  (1.702 m), SpO2 98%.  Medical Problem List and Plan: 1. Functional deficits secondary to cervical stenosis/myelopathy status post C6-7 ACDF and C2-4 laminectomies, C2-T2 PSF -has history of C3-C6 ACDF 12/26/2020. -NO BRACE REQUIRED.Marland Kitchen   -patient may not shower until Drain removed, then can shower             -ELOS/Goals: 14-16 days, supervision to min assist goals with PT, OT    D/c 3/21  Con't CIR PT and OT  Will  not need f/u with me pt having a lot of neck pain- will order soft collar AS NEEDED for comfort 2.  Antithrombotics: -DVT/anticoagulation:  Pharmaceutical: Lovenox 40mg  daily             -antiplatelet therapy: N/A  -05/04/23 vascular U/S neg for DVTs 3. Pain Management: Neurontin 300 mg 3 times daily; Tylenol, Robaxin, and oxycodone as needed -05/05/23 pt requesting robaxin be changed to flexeril-- ordered 10mg  TID PRN 3/18- changed tylenol to 325 mg q4 hours prn- and con't Oxycodone- is q3 hours prn- will try to wean over next few days 3/19- said trying to take tylenol sometimes instead of Oxycodone, but hard to do at night 3/20- will send home on Oxycodone q4 hours not q3 hours.  4. Mood/Behavior/Sleep: Provide emotional support             -antipsychotic agents: N/A -05/04/23 didn't sleep  well but perseverates on other issues so doesn't really tell me if he wants anything for this; monitor for now -05/05/23 poor sleep again, agreeable with trying melatonin 5mg  PRN 3/17- slept a little better 5. Neuropsych/cognition: This patient is capable of making decisions on his own behalf. 6. Skin/Wound Care: Routine skin checks -continue posterior surgical site drain until output is less than 100 cc over 24 hours or less than 50 cc over 12 hours.  -drain became disconnected during transfer from [facility]. Tubing was reconnected and I recharged the hemovac container. Observe tonight for functionality 3/17- is draining- however very little  in hemovac- called Dr Katrinka Blazing and if no more than 100cc in last 24 hours, can remove- will double check with nursing first to see when emptied.  3/18- Dr Katrinka Blazing Ok'd removal- will try to have done today 3/19- drain out- took shower 7. Fluids/Electrolytes/Nutrition: Routine I&O's with follow-up chemistries -05/04/23 pt very adamant that he be on regular diet; on carb mod, explained rationale behind this. Pt agreeable in the end.  8.  BPH.  Uroxatrol 10 mg daily, Cialis 2.5 mg daily (not restarted?), continue prophylactic Macrobid 100 mg daily 9.  Constipation.  Senokot 1 tablet twice daily, Colace 100 mg twice daily ---> change to senokot-s 2 qhs             -LBM 3/11 -pt says that he feels like he needs to empty. Will have dulcolax suppository available as needed. Sorbitol prn also.  -05/04/23 no BM since Monday, will start miralax BID, colace BID, continue senokot 2 tabs nightly, and use sorbitol 30ml once now.  -05/05/23 still no BM, wants to try sorbitol 60ml one more time today; cont other meds; monitor closely 3/17- Will order Sorbitol 60cc after therapy and then soap suds enema per pt request at 6pm if no BM- should make him go 3/18- LBM yesterday 3/19- LBM 2 days ago- doesn't feel constipated 3/20- LBM 3 days ago- will give Sorbitol and Suppository today  Sorbitol early per pt request and Suppository after therapy 10.  Diabetes mellitus.  Latest hemoglobin A1c 6.1, recheck Monday.  Patient on Mounjaro 10 mg weekly prior to admission. SSI, CBG checks.  -pt states that family will bring it in. -05/04/23 pt upset that he's on SSI, explained why; waiting on mounjaro to be brought in as well; for now continue SSI; CBGs are ok.  -05/05/23 mounjaro in for Friday 3/21; CBGs look great; monitor 3/17- will stop CBGs and 3/19- d/w nursing that Cbgs and SSI were d/c'd on 3/17 CBG (last 3)  Recent Labs    05/07/23 1140 05/07/23 1640 05/07/23 1703  GLUCAP 105* 67* 103*    11.  Primary open-angle glaucoma left eye.  Continue eyedrops 12. ABLA: Hgb 9.0 on 05/04/23, relatively stable; monitor  3/17- Hb 8.8- overall stable- con't to monitor 13. Leukocytosis:  -05/04/23 was 11.4 on 3/14, up to 13.1 on 3/15, but did take decadron recently... monitor on Monday, no s/sx of infection at this point 3/17- WBC down to 10.3 - no signs of infection 14. GERD: continue protonix 40mg  daily 15. HTN  3/17- pt on Cialis at home 5 mg 3x/week- willing to restart for HTN, since BP not controlled- pt says "has tried everything" in past.   3/18- BP doing much better this AM- con't regimen  3/19-3/20 BP still controlled  I spent a total of  37  minutes on total care today- >50% coordination of care- due to  D/w pt about d/c plans; possible hx of blood clot, so need for lovenox until d/c- and bowels- will not need f/u with me- can f/u AS needed if issues. Willl need F/U with [facility]- esp for staples-     LOS: 6 days A FACE TO FACE EVALUATION WAS PERFORMED  Caleb Wall 05/09/2023, 8:42 AM

## 2023-05-10 ENCOUNTER — Other Ambulatory Visit (HOSPITAL_COMMUNITY): Payer: Self-pay

## 2023-05-10 NOTE — Progress Notes (Signed)
 PROGRESS NOTE   Subjective/Complaints:   Pt reports still in pain- explained will need to get pain meds from [facility] after 7 days of meds.  Doing well otherwise.  Notes that Norco made him 'sick as a dog".  Tickle still in throat from "tube"- Notes first therapy appt they could give him was 4/24-  Wil d/w SW ROS:    Pt denies SOB, abd pain, CP, N/V/C/D, and vision changes    Objective:   No results found.  No results for input(s): "WBC", "HGB", "HCT", "PLT" in the last 72 hours.  No results for input(s): "NA", "K", "CL", "CO2", "GLUCOSE", "BUN", "CREATININE", "CALCIUM" in the last 72 hours.        Intake/Output Summary (Last 24 hours) at 05/10/2023 0823 Last data filed at 05/09/2023 1430 Gross per 24 hour  Intake 480 ml  Output 300 ml  Net 180 ml        Physical Exam: Vital Signs Blood pressure 132/72, pulse 86, temperature 98.5 F (36.9 C), temperature source Oral, resp. rate 16, height 5\' 7"  (1.702 m), SpO2 99%.       General: awake, alert, appropriate, supine in bed; sat up; NAD HENT: conjugate gaze; oropharynx dry- not wearing soft collar CV: regular rate and rhythm; no JVD Pulmonary: CTA B/L; no W/R/R- good air movement GI: soft, NT, ND, (+)BS Psychiatric: appropriate Neurological: Ox3    MSK: able to move all extremities antigravity  PRIOR EXAMS: Neurological:     Mental Status: He is alert.     Comments: Alert and oriented x 3. Normal insight and awareness. Intact Memory. Normal language and speech. Cranial nerve exam unremarkable. MMT:  UE grossly 4/5 prox to distal, perhaps a bit of HI weakness. BLE 4/5 prox to distal. Decreased LT in finger tips of both hands. DTR's 1+. No abnl resting tone.   Assessment/Plan: 1. Functional deficits which require 3+ hours per day of interdisciplinary therapy in a comprehensive inpatient rehab setting. Physiatrist is providing close team supervision and  24 hour management of active medical problems listed below. Physiatrist and rehab team continue to assess barriers to discharge/monitor patient progress toward functional and medical goals  Care Tool:  Bathing    Body parts bathed by patient: Right arm, Left arm, Chest, Abdomen, Front perineal area, Right upper leg, Left upper leg, Face, Buttocks, Right lower leg, Left lower leg   Body parts bathed by helper: Buttocks, Right lower leg, Left lower leg     Bathing assist Assist Level: Independent with assistive device     Upper Body Dressing/Undressing Upper body dressing   What is the patient wearing?: Pull over shirt    Upper body assist Assist Level: Independent with assistive device    Lower Body Dressing/Undressing Lower body dressing      What is the patient wearing?: Underwear/pull up, Pants     Lower body assist Assist for lower body dressing: Independent with assitive device     Toileting Toileting    Toileting assist Assist for toileting: Independent with assistive device     Transfers Chair/bed transfer  Transfers assist     Chair/bed transfer assist level: Independent with assistive device  Locomotion Ambulation   Ambulation assist      Assist level: Independent with assistive device Assistive device: Walker-rolling Max distance: 250   Walk 10 feet activity   Assist     Assist level: Independent with assistive device Assistive device: Walker-rolling   Walk 50 feet activity   Assist Walk 50 feet with 2 turns activity did not occur: Safety/medical concerns  Assist level: Independent with assistive device Assistive device: Walker-rolling    Walk 150 feet activity   Assist Walk 150 feet activity did not occur: Safety/medical concerns  Assist level: Independent with assistive device Assistive device: Walker-rolling    Walk 10 feet on uneven surface  activity   Assist     Assist level: Independent with assistive  device Assistive device: Walker-rolling   Wheelchair     Assist Is the patient using a wheelchair?: Yes Type of Wheelchair: Manual    Wheelchair assist level: Independent Max wheelchair distance: 150    Wheelchair 50 feet with 2 turns activity    Assist        Assist Level: Independent   Wheelchair 150 feet activity     Assist      Assist Level: Independent   Blood pressure 132/72, pulse 86, temperature 98.5 F (36.9 C), temperature source Oral, resp. rate 16, height 5\' 7"  (1.702 m), SpO2 99%.  Medical Problem List and Plan: 1. Functional deficits secondary to cervical stenosis/myelopathy status post C6-7 ACDF and C2-4 laminectomies, C2-T2 PSF -has history of C3-C6 ACDF 12/26/2020. -NO BRACE REQUIRED.Marland Kitchen   -patient may not shower until Drain removed, then can shower             -ELOS/Goals: 14-16 days, supervision to min assist goals with PT, OT    D/c today  Asked about another location for outpt PT since they cannot get him in til 4/24  Will  not need f/u with me pt having a lot of neck pain- will order soft collar AS NEEDED for comfort 2.  Antithrombotics: -DVT/anticoagulation:  Pharmaceutical: Lovenox 40mg  daily             -antiplatelet therapy: N/A  -05/04/23 vascular U/S neg for DVTs 3. Pain Management: Neurontin 300 mg 3 times daily; Tylenol, Robaxin, and oxycodone as needed -05/05/23 pt requesting robaxin be changed to flexeril-- ordered 10mg  TID PRN 3/18- changed tylenol to 325 mg q4 hours prn- and con't Oxycodone- is q3 hours prn- will try to wean over next few days 3/19- said trying to take tylenol sometimes instead of Oxycodone, but hard to do at night 3/20- will send home on Oxycodone q4 hours not q3 hours.   3/21- explained to pt going to get pain meds q4 hours prn for d/c- but will need to get meds from Dr Katrinka Blazing- [facility] after 7 days.  4. Mood/Behavior/Sleep: Provide emotional support             -antipsychotic agents: N/A -05/04/23 didn't sleep  well but perseverates on other issues so doesn't really tell me if he wants anything for this; monitor for now -05/05/23 poor sleep again, agreeable with trying melatonin 5mg  PRN 3/17- slept a little better 5. Neuropsych/cognition: This patient is capable of making decisions on his own behalf. 6. Skin/Wound Care: Routine skin checks -continue posterior surgical site drain until output is less than 100 cc over 24 hours or less than 50 cc over 12 hours.  -drain became disconnected during transfer from [facility]. Tubing was reconnected and I recharged the hemovac container. Observe tonight  for functionality 3/17- is draining- however very little in hemovac- called Dr Katrinka Blazing and if no more than 100cc in last 24 hours, can remove- will double check with nursing first to see when emptied.  3/18- Dr Katrinka Blazing Ok'd removal- will try to have done today 3/19- drain out- took shower 7. Fluids/Electrolytes/Nutrition: Routine I&O's with follow-up chemistries -05/04/23 pt very adamant that he be on regular diet; on carb mod, explained rationale behind this. Pt agreeable in the end.  8.  BPH.  Uroxatrol 10 mg daily, Cialis 2.5 mg daily (not restarted?), continue prophylactic Macrobid 100 mg daily 9.  Constipation.  Senokot 1 tablet twice daily, Colace 100 mg twice daily ---> change to senokot-s 2 qhs             -LBM 3/11 -pt says that he feels like he needs to empty. Will have dulcolax suppository available as needed. Sorbitol prn also.  -05/04/23 no BM since Monday, will start miralax BID, colace BID, continue senokot 2 tabs nightly, and use sorbitol 30ml once now.  -05/05/23 still no BM, wants to try sorbitol 60ml one more time today; cont other meds; monitor closely 3/17- Will order Sorbitol 60cc after therapy and then soap suds enema per pt request at 6pm if no BM- should make him go 3/18- LBM yesterday 3/19- LBM 2 days ago- doesn't feel constipated 3/20- LBM 3 days ago- will give Sorbitol and Suppository today  Sorbitol early per pt request and Suppository after therapy  3/21- LBM yesterday  10.  Diabetes mellitus.  Latest hemoglobin A1c 6.1, recheck Monday.  Patient on Mounjaro 10 mg weekly prior to admission. SSI, CBG checks.  -pt states that family will bring it in. -05/04/23 pt upset that he's on SSI, explained why; waiting on mounjaro to be brought in as well; for now continue SSI; CBGs are ok.  -05/05/23 mounjaro in for Friday 3/21; CBGs look great; monitor 3/17- will stop CBGs and 3/19- d/w nursing that Cbgs and SSI were d/c'd on 3/17 CBG (last 3)  Recent Labs    05/07/23 1140 05/07/23 1640 05/07/23 1703  GLUCAP 105* 67* 103*    11.  Primary open-angle glaucoma left eye.  Continue eyedrops 12. ABLA: Hgb 9.0 on 05/04/23, relatively stable; monitor  3/17- Hb 8.8- overall stable- con't to monitor 13. Leukocytosis:  -05/04/23 was 11.4 on 3/14, up to 13.1 on 3/15, but did take decadron recently... monitor on Monday, no s/sx of infection at this point 3/17- WBC down to 10.3 - no signs of infection 14. GERD: continue protonix 40mg  daily 15. HTN  3/17- pt on Cialis at home 5 mg 3x/week- willing to restart for HTN, since BP not controlled- pt says "has tried everything" in past.   3/18- BP doing much better this AM- con't regimen  3/19-3/21 BP still controlled   I spent a total of 35   minutes on total care today- >50% coordination of care- due to  - d/w pt about therapy- not available til 4/24- so asking to go somewhere else- Asked SW and PA about this- to see if can change location- educated pt about  Oxycodone and will get 7 days of pain meds, then will need to get from [facility].     LOS: 7 days A FACE TO FACE EVALUATION WAS PERFORMED  Rastus Borton 05/10/2023, 8:23 AM

## 2023-05-10 NOTE — Progress Notes (Signed)
 Inpatient Rehabilitation Discharge Medication Review by a Pharmacist  A complete drug regimen review was completed for this patient to identify any potential clinically significant medication issues.  High Risk Drug Classes Is patient taking? Indication by Medication  Antipsychotic No   Anticoagulant No   Antibiotic Yes PO nitrofurantoin - UTI ppx  Opioid Yes Oxycodone prn pain  Antiplatelet No   Hypoglycemics/insulin Yes Mounjaro - DM  Vasoactive Medication Yes Alfuzosin - BPH  Chemotherapy No   Other Yes Acetaminophen- pain Brimonidine, Cosopt, Latanoprost-glaucoma Cialis- BPH Colace, miralax- constipation Cyclobenzaprine- muscle spasms Gabapentin - pain Pantoprazole - reflux     Type of Medication Issue Identified Description of Issue Recommendation(s)  Drug Interaction(s) (clinically significant)     Duplicate Therapy     Allergy     No Medication Administration End Date     Incorrect Dose     Additional Drug Therapy Needed     Significant med changes from prior encounter (inform family/care partners about these prior to discharge).  PTA medications: Bisacodyl,  BC headache powder and methocarbamol were discontinued.  Communicate to patient /family/ caregiver prior to discharge. It is noted to stop taking in the discharge AVS.    Other       Clinically significant medication issues were identified that warrant physician communication and completion of prescribed/recommended actions by midnight of the next day:  No   Time spent performing this drug regimen review (minutes):  30     Veyda Kaufman BS, PharmD, BCPS Clinical Pharmacist 05/10/2023 7:04 AM  Contact: [phone] after 3 PM  "Be curious, not judgmental..." -Debbora Dus

## 2023-05-10 NOTE — Progress Notes (Signed)
 Inpatient Rehabilitation Care Coordinator Discharge Note   Patient Details  Name: Caleb Wall MRN: [id number] Date of Birth: 12/15/1955   Discharge location: HOME WITH WIFE WHO IS ABLE TO BE WITH SHORT TIME 24/7  Length of Stay: 7 DAYS  Discharge activity level: MOD/I LEVEL  Home/community participation: ACTIVE  Patient response UV:OZDGUY Literacy - How often do you need to have someone help you when you read instructions, pamphlets, or other written material from your doctor or pharmacy?: Never  Patient response QI:HKVQQV Isolation - How often do you feel lonely or isolated from those around you?: Never  Services provided included: MD, RD, PT, OT, RN, CM, Pharmacy, SW  Financial Services:  Field seismologist Utilized: Chartered loss adjuster MEDICARE  Choices offered to/list presented to:    Follow-up services arranged:  Outpatient, DME, Patient/Family has no preference for HH/DME agencies    Outpatient Servicies: [facility] WILL CALL TO SET UP FOLLOW UP APPOINTMENT DME : [facility]    Patient response to transportation need: Is the patient able to respond to transportation needs?: Yes In the past 12 months, has lack of transportation kept you from medical appointments or from getting medications?: No In the past 12 months, has lack of transportation kept you from meetings, work, or from getting things needed for daily living?: No   Patient/Family verbalized understanding of follow-up arrangements:  Yes  Individual responsible for coordination of the follow-up plan: SELF AND ROBIN-WIFE [phone]  Confirmed correct DME delivered: Lucy Chris 05/10/2023    Comments (or additional information):PT PROGRESSED QUICKLY AND IS READY TO GO HOME. HIS WIFE WAS HERE AND OBSERVED IN THERAPIES. WILL BE WITH HIM ONE WEEK THEN GO BACK TO WORK  Summary of Stay    Date/Time Discharge Planning CSW  05/07/23 1044 Return home with wife and daughter to assist  PRN if needed. Will await team's recommendations fro DME and follow up RGD       Nickalous Stingley, Lemar Livings

## 2023-05-11 ENCOUNTER — Other Ambulatory Visit: Payer: Self-pay | Admitting: Physician Assistant

## 2023-05-15 ENCOUNTER — Ambulatory Visit (INDEPENDENT_AMBULATORY_CARE_PROVIDER_SITE_OTHER): Payer: Medicare (Managed Care) | Admitting: Orthopedic Surgery

## 2023-05-15 ENCOUNTER — Encounter: Payer: Self-pay | Admitting: Orthopedic Surgery

## 2023-05-15 VITALS — BP 152/84 | Temp 98.4°F | Ht 67.0 in | Wt 222.0 lb

## 2023-05-15 DIAGNOSIS — G959 Disease of spinal cord, unspecified: Secondary | ICD-10-CM

## 2023-05-15 DIAGNOSIS — Z981 Arthrodesis status: Secondary | ICD-10-CM

## 2023-05-15 DIAGNOSIS — M5020 Other cervical disc displacement, unspecified cervical region: Secondary | ICD-10-CM

## 2023-05-15 MED ORDER — OXYCODONE HCL 10 MG PO TABS: 10.0000 mg | ORAL_TABLET | ORAL | 0 refills | Status: DC | PRN

## 2023-05-15 NOTE — Patient Instructions (Signed)
 It was nice to see you today.   I am glad that you are feeling better!   Okay to get incision swet in the shower, do not submerge in pool or hot tub.   Call if any concerns about the incision such as redness, drainage, or fever/chills.   You can lift up to 10 pounds until your follow up with Dr.  Katrinka Blazing in 4 weeks.   I sent a refill of oxycodone to your pharmacy. Continue to take the least amount needed and only take for severe pain. Remember this medication can make you sleepy and/or constipated.   Stop the cyclobenzaprine and gabapentin.   Wear collar when you are up and walking.   We will see you back in 4 weeks for your 6 weeks postop visit. Get xrays prior to this visit.   Your blood pressure was elevated today. I want you to recheck it at home and follow up with your PCP if it remains high. If you have any chest pain, shortness of breath, blurry vision, or headaches then you need to go to ED.    Please call with any questions or concerns.   Drake Leach PA-C [phone]     The physicians and staff at [facility] are committed to providing excellent care. You may receive a survey asking for feedback about your experience at our office. We value you your feedback and appreciate you taking the time to to fill it out. The [facility] leadership team is also available to discuss your experience in person, feel free to contact us [phone].

## 2023-05-16 ENCOUNTER — Ambulatory Visit: Payer: Medicare (Managed Care) | Attending: Physician Assistant

## 2023-05-16 DIAGNOSIS — M6281 Muscle weakness (generalized): Secondary | ICD-10-CM | POA: Diagnosis present

## 2023-05-16 DIAGNOSIS — M542 Cervicalgia: Secondary | ICD-10-CM | POA: Insufficient documentation

## 2023-05-16 DIAGNOSIS — R262 Difficulty in walking, not elsewhere classified: Secondary | ICD-10-CM | POA: Diagnosis present

## 2023-05-16 NOTE — Therapy (Signed)
 OUTPATIENT PHYSICAL THERAPY CERVICAL EVALUATION   Patient Name: Caleb Wall MRN: [id number] DOB:04/04/1955, 68 y.o., male Today's Date: 05/16/2023  END OF SESSION:  PT End of Session - 05/16/23 1118     Visit Number 1    Number of Visits 25    Date for PT Re-Evaluation 08/09/23    PT Start Time 1040    PT Stop Time 1119    PT Time Calculation (min) 39 min    Activity Tolerance Patient tolerated treatment well    Behavior During Therapy WFL for tasks assessed/performed             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    Other secondary kyphosis, cervical region    Prediabetes    Primary open angle glaucoma (POAG) of left eye    Pseudoarthrosis of cervical spine (HCC)    Sepsis secondary to UTI (HCC)    Spondylosis of cervical spine    Past Surgical History:  Procedure Laterality Date   ANTERIOR CERVICAL DECOMP/DISCECTOMY FUSION N/A 04/30/2023   Procedure: C6-7 ANTERIOR CERVICAL DISCECTOMY AND FUSION;  Surgeon: Lovenia Kim, MD;  Location: [facility];  Service: Neurosurgery;  Laterality: N/A;   ANTERIOR CERVICAL DECOMPRESSION/DISCECTOMY FUSION 4 LEVELS N/A 12/26/2020   Procedure: C3-6 ANTERIOR CERVICAL DECOMPRESSION/DISCECTOMY FUSION 3 LEVELS;  Surgeon: Lucy Chris, MD;  Location: [facility];  Service: Neurosurgery;  Laterality: N/A;   BROW LIFT AND BLEPHAROPLASTY Bilateral 12/21/2019   CATARACT EXTRACTION W/ INTRAOCULAR LENS IMPLANT Left 02/05/2019   COLONOSCOPY WITH PROPOFOL N/A 09/29/2021   Procedure: COLONOSCOPY WITH PROPOFOL;  Surgeon: Regis Bill, MD;  Location: [facility];  Service: Endoscopy;  Laterality: N/A;   KNEE SURGERY Left 2002   Meniscus tear    LITHOTRIPSY  12/13/2020   MASS EXCISION N/A 10/11/2022   Procedure: EXCISION MASS CHEST WALL;  Surgeon: Sung Amabile, DO;  Location: [facility];  Service: General;  Laterality: N/A;   ORIF FEMORAL NECK FRACTURE W/ DHS Left 03/06/2020   POSTERIOR CERVICAL FUSION/FORAMINOTOMY N/A 04/30/2023   Procedure: C2-T2 POSTERIOR SPINAL FUSION;  Surgeon: Lovenia Kim, MD;  Location: [facility];  Service: Neurosurgery;  Laterality: N/A;   POSTERIOR CERVICAL LAMINECTOMY N/A 04/30/2023   Procedure: C2-C4 LAMINECTOMY;  Surgeon: Lovenia Kim, MD;  Location: [facility];  Service: Neurosurgery;  Laterality: N/A;   SHOULDER ARTHROSCOPY WITH OPEN ROTATOR CUFF REPAIR AND DISTAL CLAVICLE ACROMINECTOMY Left 07/25/2021   Procedure: SHOULDER ARTHROSCOPY WITH OPEN ROTATOR CUFF REPAIR AND DISTAL CLAVICLE ACROMINECTOMY;  Surgeon: Juanell Fairly, MD;  Location: [facility];  Service: Orthopedics;  Laterality: Left;   TOTAL HIP ARTHROPLASTY Left 03/06/2020   Patient Active Problem List   Diagnosis Date Noted   Pseudoarthrosis of cervical spine (HCC) 04/12/2023   S/P cervical spinal fusion 04/03/2023   Spinal stenosis in cervical region 04/03/2023   Cervical spondylosis 04/03/2023   Right leg weakness 04/03/2023   Right arm weakness 04/03/2023   Cervical spondylosis without myelopathy 04/03/2023   Other secondary kyphosis, cervical region 04/03/2023   Cervical myelopathy (HCC) 12/26/2020   Hypertension 02/23/2014   Plantar wart 02/23/2014   Benign prostatic hyperplasia  02/23/2014   Diabetes (HCC) 04/23/2013    PCP: [facility]   REFERRING PROVIDER: Charlton Amor, PA-C   REFERRING DIAG: G95.9 (ICD-10-CM) - Cervical myelopathy (HCC)  THERAPY DIAG:  Cervicalgia - Plan: PT plan of care cert/re-cert  Difficulty in walking, not elsewhere classified - Plan: PT plan of care cert/re-cert  Muscle weakness (generalized) - Plan: PT plan of care cert/re-cert  Rationale for Evaluation and Treatment:  Rehabilitation  ONSET DATE: DOS: 04/30/23 ACDF C6-C7, C2-C4 laminectomies, C2-T2 PSF  SUBJECTIVE:                                                                                                                                                                                                         SUBJECTIVE STATEMENT: Neck: 5/10 currently  Hand dominance: Right  PERTINENT HISTORY:  DOS: 04/30/23 ACDF C6-C7, C2-C4 laminectomies, C2-T2 PSF. Neck hurts but not severely. Pt had neck fusions in 2022 levels C3-4. C2 and C3 did not fuse. Pt had PT at the hospital after the surgery and just got out of the hospital last Friday 05/09/2023). Feels like he has to use the bathroom a lot after the surgery. Pt had to learn how to walk after the procedure. Had R LE weakness and had to re-learn how to use his R LE. Has L hamstring weakness too. Was given a cervical collar (at home currently), does not have to put it while at home unless he is outside in the yard or watching TV or working on the computer. Has tingling in his B fingertips. Feels better since his surgery.  Has L sciatic nerve tingling. Pt is used to limping on his R side. Has a 5 lbs lifting restricitions.   Pt fell in 2005 and thought he was paralyzed.    No latex allergies Blood pressure is controlled per pt.   Wife present initially during the eval per pt request.     PAIN:  Are you having pain? Yes: NPRS scale: 5/10 Pain location: neck and shoulders Pain description: sore, ache  Aggravating factors: turning his head Relieving factors: rest  PRECAUTIONS: Cervical  RED FLAGS: Bowel or bladder incontinence: No and Cauda equina syndrome: No     WEIGHT BEARING RESTRICTIONS: No  FALLS:  Has patient fallen in last 6 months? No  LIVING ENVIRONMENT: Lives with: lives with their spouse Lives in: House/apartment Stairs: Yes: External: 3 steps; can reach both Has following equipment at home: Single point cane, Walker - 2 wheeled,  Tour manager, and Grab bars  OCCUPATION: Retired  PLOF: Independent  PATIENT GOALS: Just get better.   NEXT MD VISIT: Jul 12, 2023  OBJECTIVE:  Note: Objective measures were completed at Evaluation unless otherwise noted.  DIAGNOSTIC FINDINGS:    PATIENT SURVEYS:  NDI 30/50 (severe disability) (05/16/2023)  COGNITION: Overall cognitive status: Within functional limits for tasks assessed  SENSATION: Decreased sensation to light touch B hands at tips of all fingers  POSTURE: forward neck, B protracted shoulders R shoulder higher,   PALPATION: TTP neck, and upper trap  Surgical incision healing well.   CERVICAL ROM:   Active ROM A/PROM (deg) eval  Flexion   Extension   Right lateral flexion   Left lateral flexion   Right rotation   Left rotation    (Blank rows = not tested Not tested  UPPER EXTREMITY ROM:  Active ROM Right eval Left eval  Shoulder flexion    Shoulder extension    Shoulder abduction    Shoulder adduction    Shoulder extension    Shoulder internal rotation    Shoulder external rotation    Elbow flexion    Elbow extension    Wrist flexion    Wrist extension    Wrist ulnar deviation    Wrist radial deviation    Wrist pronation    Wrist supination     (Blank rows = not tested)  UPPER EXTREMITY MMT:  MMT Right eval Left eval  Shoulder flexion (at available range) 3+ 3+  Shoulder extension    Shoulder abduction 4- 4-  Shoulder adduction    Shoulder extension    Shoulder internal rotation    Shoulder external rotation    Middle trapezius    Lower trapezius    Elbow flexion 4- 4-  Elbow extension 4- 4  Wrist flexion    Wrist extension    Wrist ulnar deviation    Wrist radial deviation    Wrist pronation    Wrist supination    Grip strength     (Blank rows = not tested)   LOWER EXTREMITY MMT:    MMT Right eval Left eval  Hip flexion    Hip extension (seated manually resisted) 3 3  Hip abduction (seated manually  resisted) 4 4-  Hip adduction    Hip internal rotation    Hip external rotation    Knee flexion 3+ 4-  Knee extension 4 4+  Ankle dorsiflexion    Ankle plantarflexion    Ankle inversion    Ankle eversion     (Blank rows = not tested)     CERVICAL SPECIAL TESTS:  Not applicable  FUNCTIONAL TESTS:    GAIT Gait: SPC on R side, antalgic decreased stance R LE SPC switched to L side to support R LE more. Feels better per pt     TREATMENT DATE: 05/16/2023  Pt took multiple restroom breaks during session  Gait training  Gait with SPC on L 50 ft CGA  Improved technique, movement at target joints, use of target muscles after mod verbal, visual, tactile cues.    PATIENT EDUCATION:  Education details: Gait with SPC, POC Person educated: Patient Education method: Explanation, Demonstration, and Verbal cues Education comprehension: verbalized understanding and returned demonstration  HOME EXERCISE PROGRAM: Gait with SPC on L side secondary to R LE functional weakness  ASSESSMENT:  CLINICAL IMPRESSION: Patient is a 68 y.o. male who was seen today for physical therapy evaluation and treatment for C2-T2 cervical fusion on 04/30/2023. He also presents with altered gait pattern and posture, TTP to neck and upper trap area, B UE and LE weakness, and difficulty looking around as well as ambulating, and performing standing tasks secondary to weakness. Pt will benefit from skilled physical therapy services to address the aforementioned deficits.       OBJECTIVE IMPAIRMENTS: Abnormal gait, decreased balance, difficulty walking, decreased strength, impaired UE functional use, improper body mechanics, postural dysfunction, and pain.   ACTIVITY LIMITATIONS: carrying, lifting, squatting, sleeping, stairs, dressing, and locomotion level  PARTICIPATION LIMITATIONS:    PERSONAL FACTORS: Age, Fitness, Past/current experiences, Time since onset of injury/illness/exacerbation, and 3+ comorbidities: DM, HTN, hepatitis C, hx of previous neck surgery   are also affecting patient's functional outcome.   REHAB POTENTIAL: Fair    CLINICAL DECISION MAKING: Stable/uncomplicated  EVALUATION COMPLEXITY: Low   GOALS: Goals reviewed with patient? Yes  SHORT TERM GOALS: Target date: 05/31/2023  Pt will be independent with his initial HEP to improve strength, function, and ability to ambulate with less difficulty. Baseline: Pt has not yet formally started his initial HEP (05/16/2023) Goal status: INITIAL    LONG TERM GOALS: Target date: 08/09/2023  Pt will be able to ambulate at least 500 ft without AD safely to promote mobility.  Baseline: Pt currently ambulating with SPC (05/16/2023) Goal status: INITIAL  2.  Pt will improve her Neck Disability Index (NDI)  Score by at least 15 points as a demonstration of improved function.  Baseline: NDI 30/50 (severe disability) (05/16/2023) Goal status: INITIAL  3.  Pt will improve his B UE strength by at least 1/2 MMT to improve ability to perform tasks at home as well as don and doff clothes with less difficulty.  Baseline:  MMT Right eval Left eval  Shoulder flexion (at available range) 3+ 3+  Shoulder extension    Shoulder abduction 4- 4-  Shoulder adduction    Shoulder extension    Shoulder internal rotation    Shoulder external rotation    Middle trapezius    Lower trapezius    Elbow flexion 4- 4-  Elbow extension 4- 4   (05/16/2023)  Goal status: INITIAL  4.  Pt will improve his B LE strength by at least 1/2 MMT grade to promote ability to ambulate as well as perform standing tasks with less difficulty.  Baseline:  MMT Right eval Left eval  Hip flexion    Hip extension (seated manually resisted) 3 3  Hip abduction (seated manually resisted) 4 4-  Hip adduction    Hip internal rotation    Hip  external rotation    Knee flexion 3+ 4-  Knee extension 4 4+  Ankle dorsiflexion    Ankle plantarflexion    Ankle inversion    Ankle eversion     (05/16/2023)  Goal status: INITIAL     PLAN:  PT FREQUENCY:  1-2x/week  PT DURATION: 12 weeks  PLANNED INTERVENTIONS: 97110-Therapeutic exercises, 97530- Therapeutic activity, [id number]- Neuromuscular re-education, [phone]- Self Care, [id number]- Manual therapy, [phone]- Gait training, [phone]- Aquatic Therapy, [phone]- Electrical stimulation (unattended), [phone]- Ionotophoresis 4mg /ml Dexamethasone, Patient/Family education, Balance training, Stair training, Dry Needling, and Scar mobilization  PLAN FOR NEXT SESSION: posture, gentle functional strengthening, gait training, manual techniques, modalities PRN.    Beverley Allender, PT, DPT 05/16/2023, 12:58 PM

## 2023-05-20 DIAGNOSIS — M5020 Other cervical disc displacement, unspecified cervical region: Secondary | ICD-10-CM

## 2023-05-20 DIAGNOSIS — Z981 Arthrodesis status: Secondary | ICD-10-CM

## 2023-05-20 DIAGNOSIS — G959 Disease of spinal cord, unspecified: Secondary | ICD-10-CM

## 2023-05-21 MED ORDER — OXYCODONE HCL 10 MG PO TABS: 10.0000 mg | ORAL_TABLET | ORAL | 0 refills | Status: DC | PRN

## 2023-05-21 NOTE — Telephone Encounter (Signed)
 DOS: 04/30/23 ACDF C6-C7, C2-C4 laminectomies, C2-T2 PSF   Please call him and make sure he has no signs of calf swelling/redness or tenderness.   If all of the swelling is localized to the foot, he should follow up with PCP. Let me know if you have questions.   Okay for him to walk on flat surface.   Will refill his oxycodone and keep at 10mg  this time. Consider going to 5mg  at next refill.   Please let him know.

## 2023-05-21 NOTE — Telephone Encounter (Signed)
 Patient states that the swelling is localized to the foot.   I asked about any swelling, redness, tenderness in the calves. He denies these symptoms.   I advised he follow up with his PCP. He indicates understanding.   I shared the ok to walk on flat surfaces and told him that we refilled his medication.

## 2023-05-22 IMAGING — CT CT CERVICAL SPINE W/O CM
2 series · 10 of 14 positions shown, 12 images · non-contrast
Comparison: None.

CLINICAL DATA: Left arm weakness. NKI

EXAM:
CT CERVICAL SPINE WITHOUT CONTRAST
TECHNIQUE: Multidetector CT imaging of the cervical spine was performed without
intravenous contrast. Multiplanar CT image reconstructions were also
generated.

[Series 5: c spine soft · axial · 0.48mm/px · z∈[-300,-188]mm · 4 of 94 slices shown]
[im 19/94  soft-tissue]
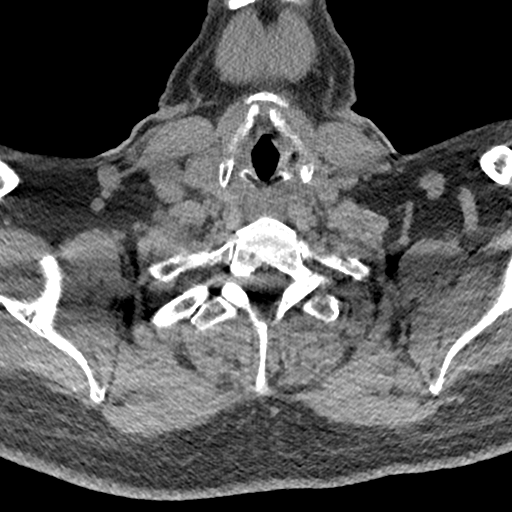
[im 38/94  soft-tissue]
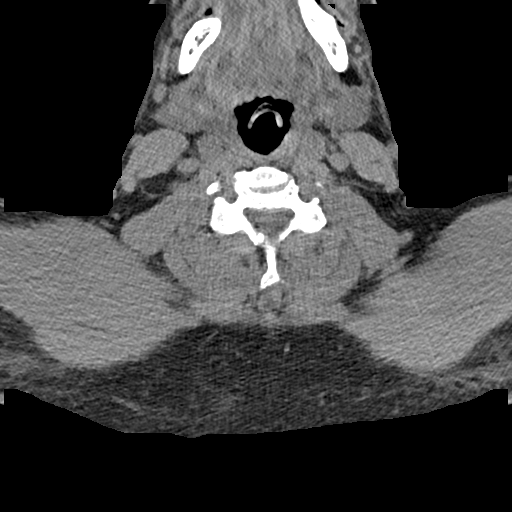
[im 56/94  soft-tissue]
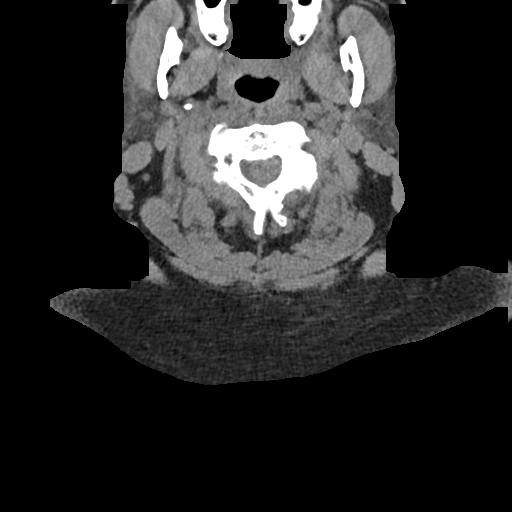
[im 75/94  soft-tissue]
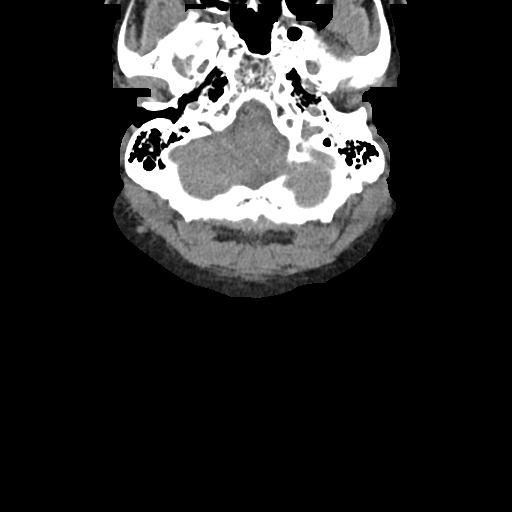

[Series 6: orthogonal bone · axial · 0.23mm/px · z∈[-365,-221]mm · 6 of 119 slices shown, 8 images]
[im 17/119  soft-tissue]
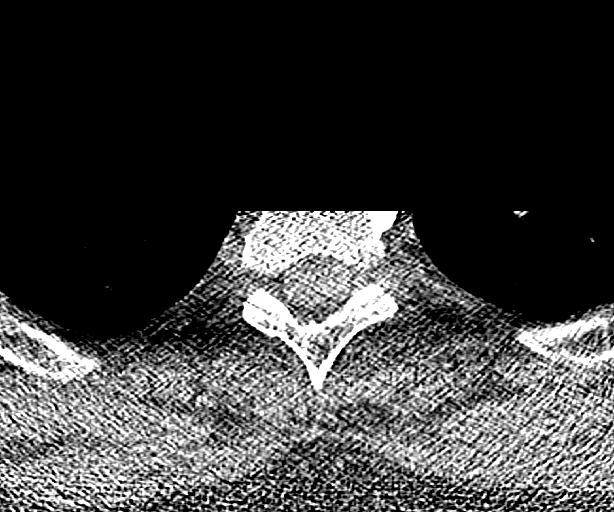
[im 17/119  bone]
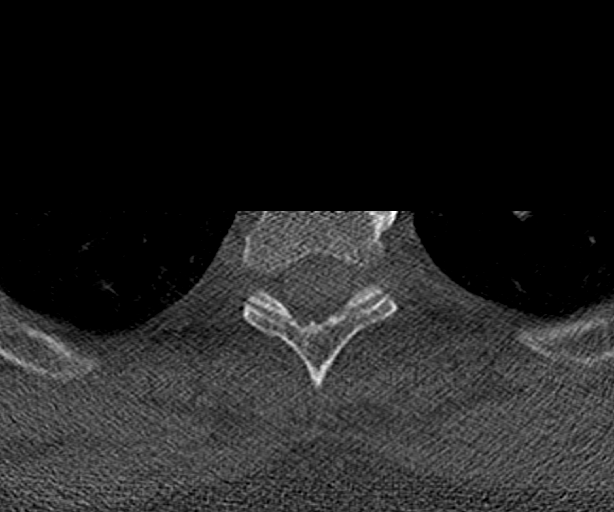
[im 34/119  bone]
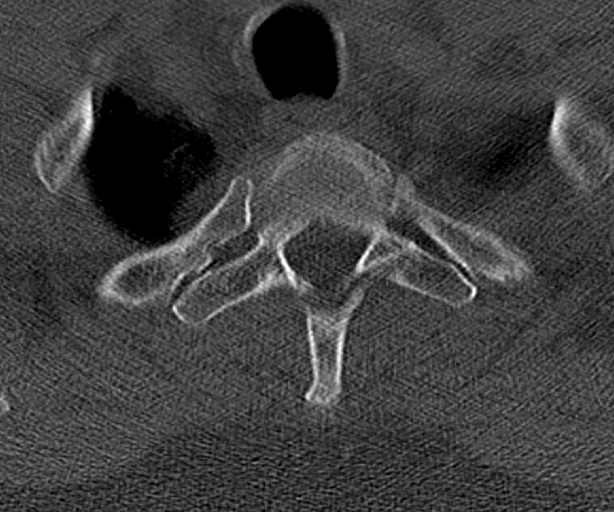
[im 51/119  bone]
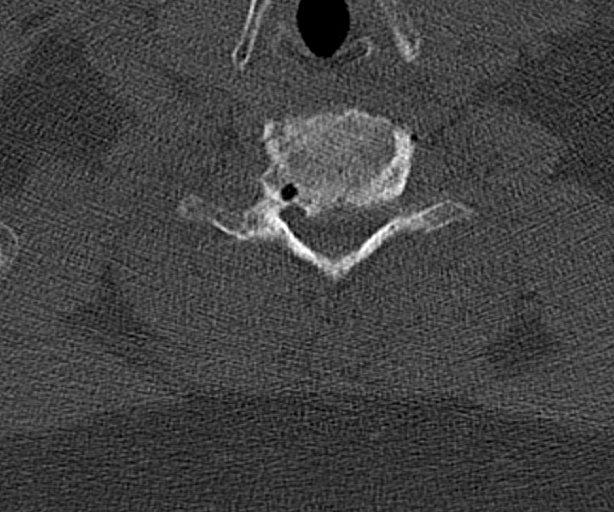
[im 68/119  bone]
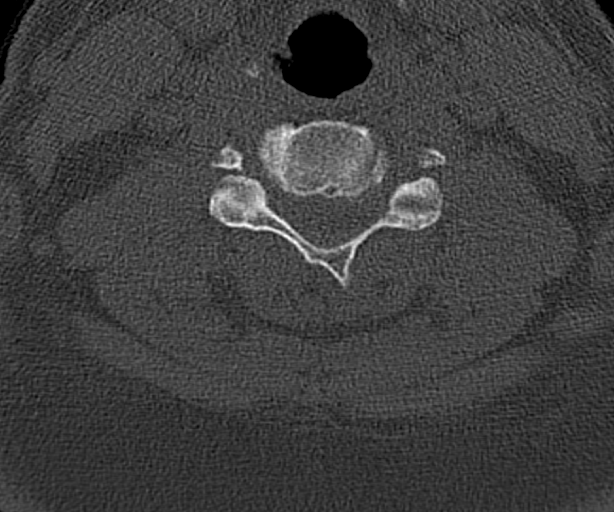
[im 85/119  soft-tissue]
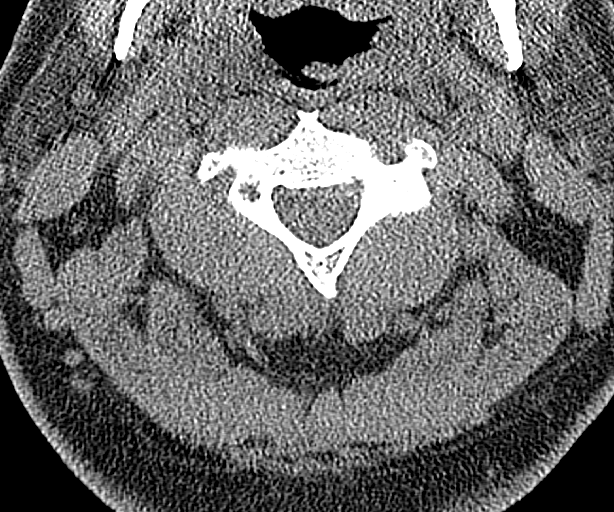
[im 85/119  bone]
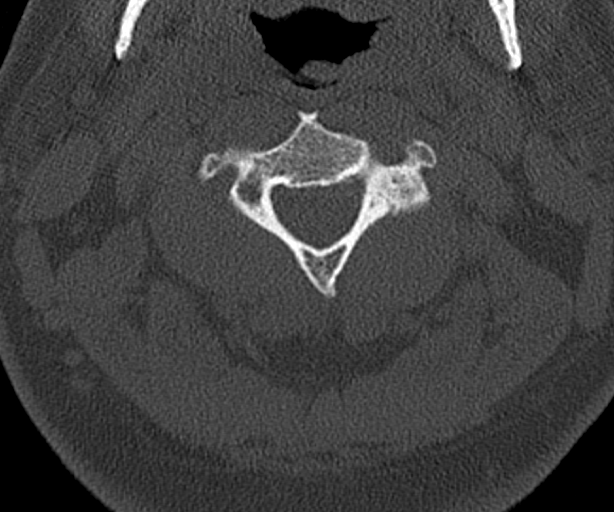
[im 102/119  bone]
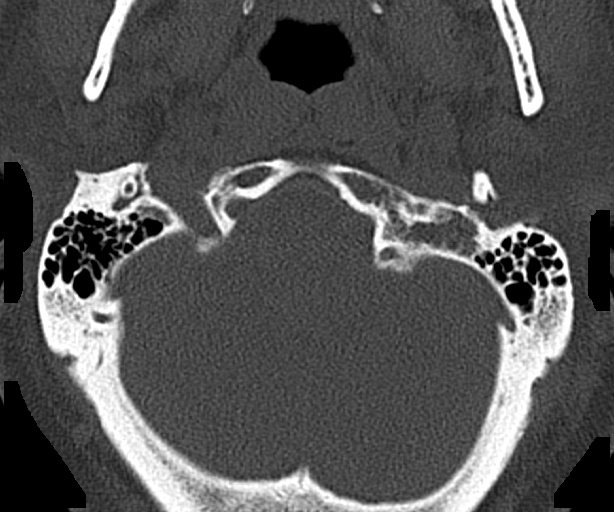

[10 of 14 positions shown; findings below may reference images not displayed]

FINDINGS: Alignment: Straightening of the normal cervical lordosis likely due
to positioning and degenerative changes.

Skull base and vertebrae: Multilevel degenerative changes of the
spine. Severe osseous neural foraminal stenosis at the left C2-C3
level. Multilevel moderate osseous neural foraminal stenosis. No
severe osseous central canal stenosis. No acute fracture. No
aggressive appearing focal osseous lesion or focal pathologic
process.

Soft tissues and spinal canal: No prevertebral fluid or swelling. No
visible canal hematoma.

Upper chest: Unremarkable.

Other: None.
IMPRESSION: 1. No acute displaced fracture or traumatic listhesis of the
cervical spine.
2. Multilevel degenerative changes of the spine with severe osseous
neural foraminal stenosis at the left C2-C3 level.

## 2023-05-23 ENCOUNTER — Ambulatory Visit: Payer: Medicare (Managed Care) | Attending: Physician Assistant

## 2023-05-23 DIAGNOSIS — M542 Cervicalgia: Secondary | ICD-10-CM | POA: Insufficient documentation

## 2023-05-23 DIAGNOSIS — R262 Difficulty in walking, not elsewhere classified: Secondary | ICD-10-CM | POA: Insufficient documentation

## 2023-05-23 DIAGNOSIS — M6281 Muscle weakness (generalized): Secondary | ICD-10-CM | POA: Diagnosis present

## 2023-05-23 DIAGNOSIS — G959 Disease of spinal cord, unspecified: Secondary | ICD-10-CM | POA: Insufficient documentation

## 2023-05-23 NOTE — Therapy (Signed)
 OUTPATIENT PHYSICAL THERAPY TREATMENT   Patient Name: Lumir Demetriou MRN: [id number] DOB:August 04, 1955, 68 y.o., male Today's Date: 05/23/2023  END OF SESSION:  PT End of Session - 05/23/23 1035     Visit Number 2    Number of Visits 25    Date for PT Re-Evaluation 08/09/23    PT Start Time 1035    PT Stop Time 1114    PT Time Calculation (min) 39 min    Activity Tolerance Patient tolerated treatment well    Behavior During Therapy WFL for tasks assessed/performed              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    Other secondary kyphosis, cervical region    Prediabetes    Primary open angle glaucoma (POAG) of left eye    Pseudoarthrosis of cervical spine (HCC)    Sepsis secondary to UTI (HCC)    Spondylosis of cervical spine    Past Surgical History:  Procedure Laterality Date   ANTERIOR CERVICAL DECOMP/DISCECTOMY FUSION N/A 04/30/2023   Procedure: C6-7 ANTERIOR CERVICAL DISCECTOMY AND FUSION;  Surgeon: Lovenia Kim, MD;  Location: [facility];  Service: Neurosurgery;  Laterality: N/A;   ANTERIOR CERVICAL DECOMPRESSION/DISCECTOMY FUSION 4 LEVELS N/A 12/26/2020   Procedure: C3-6 ANTERIOR CERVICAL DECOMPRESSION/DISCECTOMY FUSION 3 LEVELS;  Surgeon: Lucy Chris, MD;  Location: [facility];  Service: Neurosurgery;  Laterality: N/A;   BROW LIFT AND BLEPHAROPLASTY Bilateral 12/21/2019   CATARACT EXTRACTION W/ INTRAOCULAR LENS IMPLANT Left 02/05/2019   COLONOSCOPY WITH PROPOFOL N/A 09/29/2021   Procedure: COLONOSCOPY WITH PROPOFOL;  Surgeon: Regis Bill, MD;  Location: [facility];  Service: Endoscopy;  Laterality: N/A;   KNEE SURGERY Left 2002   Meniscus tear    LITHOTRIPSY  12/13/2020   MASS EXCISION N/A 10/11/2022   Procedure: EXCISION MASS CHEST WALL;  Surgeon: Sung Amabile, DO;  Location: [facility];  Service: General;  Laterality: N/A;   ORIF FEMORAL NECK FRACTURE W/ DHS Left 03/06/2020   POSTERIOR CERVICAL FUSION/FORAMINOTOMY N/A 04/30/2023   Procedure: C2-T2 POSTERIOR SPINAL FUSION;  Surgeon: Lovenia Kim, MD;  Location: [facility];  Service: Neurosurgery;  Laterality: N/A;   POSTERIOR CERVICAL LAMINECTOMY N/A 04/30/2023   Procedure: C2-C4 LAMINECTOMY;  Surgeon: Lovenia Kim, MD;  Location: [facility];  Service: Neurosurgery;  Laterality: N/A;   SHOULDER ARTHROSCOPY WITH OPEN ROTATOR CUFF REPAIR AND DISTAL CLAVICLE ACROMINECTOMY Left 07/25/2021   Procedure: SHOULDER ARTHROSCOPY WITH OPEN ROTATOR CUFF REPAIR AND DISTAL CLAVICLE ACROMINECTOMY;  Surgeon: Juanell Fairly, MD;  Location: [facility];  Service: Orthopedics;  Laterality: Left;   TOTAL HIP ARTHROPLASTY Left 03/06/2020   Patient Active Problem List   Diagnosis Date Noted   Pseudoarthrosis of cervical spine (HCC) 04/12/2023   S/P cervical spinal fusion 04/03/2023   Spinal stenosis in cervical region 04/03/2023   Cervical spondylosis 04/03/2023   Right leg weakness 04/03/2023   Right arm weakness 04/03/2023   Cervical spondylosis without myelopathy 04/03/2023   Other secondary kyphosis, cervical region 04/03/2023   Cervical myelopathy (HCC) 12/26/2020   Hypertension 02/23/2014   Plantar wart 02/23/2014   Benign prostatic hyperplasia  02/23/2014   Diabetes (HCC) 04/23/2013    PCP: [facility]   REFERRING PROVIDER: Charlton Amor, PA-C   REFERRING DIAG: G95.9 (ICD-10-CM) - Cervical myelopathy (HCC)  THERAPY DIAG:  Cervicalgia  Difficulty in walking, not elsewhere classified  Muscle weakness (generalized)  Rationale for Evaluation and Treatment: Rehabilitation  ONSET DATE: DOS: 04/30/23 ACDF C6-C7, C2-C4 laminectomies, C2-T2 PSF  SUBJECTIVE:                                                                                                                                                                                                          SUBJECTIVE STATEMENT: R foot is swollen was told that it was normal. The swelling is decreasing. Neck 6/10 currently.    Hand dominance: Right  PERTINENT HISTORY:  DOS: 04/30/23 ACDF C6-C7, C2-C4 laminectomies, C2-T2 PSF. Neck hurts but not severely. Pt had neck fusions in 2022 levels C3-4. C2 and C3 did not fuse. Pt had PT at the hospital after the surgery and just got out of the hospital last Friday 05/09/2023). Feels like he has to use the bathroom a lot after the surgery. Pt had to learn how to walk after the procedure. Had R LE weakness and had to re-learn how to use his R LE. Has L hamstring weakness too. Was given a cervical collar (at home currently), does not have to put it while at home unless he is outside in the yard or watching TV or working on the computer. Has tingling in his B fingertips. Feels better since his surgery.  Has L sciatic nerve tingling. Pt is used to limping on his R side. Has a 5 lbs lifting restricitions.   Pt fell in 2005 and thought he was paralyzed.    No latex allergies Blood pressure is controlled per pt.   Wife present initially during the eval per pt request.     PAIN:  Are you having pain? Yes: NPRS scale: 5/10 Pain location: neck and shoulders Pain description: sore, ache  Aggravating factors: turning his head Relieving factors: rest  PRECAUTIONS: Cervical  RED FLAGS: Bowel or bladder incontinence: No and Cauda equina syndrome: No     WEIGHT BEARING RESTRICTIONS: No  FALLS:  Has patient fallen in last 6 months? No  LIVING ENVIRONMENT: Lives with: lives with their spouse Lives in: House/apartment Stairs: Yes: External: 3 steps; can reach both Has following equipment at home: Single point cane, Walker - 2 wheeled, Tour manager, and Grab bars  OCCUPATION:  Retired  PLOF: Independent  PATIENT GOALS: Just get better.  NEXT MD VISIT: Jul 12, 2023  OBJECTIVE:  Note: Objective measures were completed at Evaluation unless otherwise noted.  DIAGNOSTIC FINDINGS:    PATIENT SURVEYS:  NDI 30/50 (severe disability) (05/16/2023)  COGNITION: Overall cognitive status: Within functional limits for tasks assessed  SENSATION: Decreased sensation to light touch B hands at tips of all fingers  POSTURE: forward neck, B protracted shoulders R shoulder higher,   PALPATION: TTP neck, and upper trap  Surgical incision healing well.   CERVICAL ROM:   Active ROM A/PROM (deg) eval  Flexion   Extension   Right lateral flexion   Left lateral flexion   Right rotation   Left rotation    (Blank rows = not tested Not tested  UPPER EXTREMITY ROM:  Active ROM Right eval Left eval  Shoulder flexion    Shoulder extension    Shoulder abduction    Shoulder adduction    Shoulder extension    Shoulder internal rotation    Shoulder external rotation    Elbow flexion    Elbow extension    Wrist flexion    Wrist extension    Wrist ulnar deviation    Wrist radial deviation    Wrist pronation    Wrist supination     (Blank rows = not tested)  UPPER EXTREMITY MMT:  MMT Right eval Left eval  Shoulder flexion (at available range) 3+ 3+  Shoulder extension    Shoulder abduction 4- 4-  Shoulder adduction    Shoulder extension    Shoulder internal rotation    Shoulder external rotation    Middle trapezius    Lower trapezius    Elbow flexion 4- 4-  Elbow extension 4- 4  Wrist flexion    Wrist extension    Wrist ulnar deviation    Wrist radial deviation    Wrist pronation    Wrist supination    Grip strength     (Blank rows = not tested)   LOWER EXTREMITY MMT:    MMT Right eval Left eval  Hip flexion    Hip extension (seated manually resisted) 3 3  Hip abduction (seated manually resisted) 4 4-  Hip adduction    Hip internal  rotation    Hip external rotation    Knee flexion 3+ 4-  Knee extension 4 4+  Ankle dorsiflexion    Ankle plantarflexion    Ankle inversion    Ankle eversion     (Blank rows = not tested)     CERVICAL SPECIAL TESTS:  Not applicable  FUNCTIONAL TESTS:    GAIT Gait: SPC on R side, antalgic decreased stance R LE SPC switched to L side to support R LE more. Feels better per pt     TREATMENT DATE: 05/23/2023  Therapeutic exercise Pt was not wearing a cervical collar observed.  Gait with SPC on L 100 ft  Reclined with head and neck supported  Transversus abdominis contraction 10x3 with 5 second holds   Glute max set 10x3 with 5 second holds   B scapular retraction 10x3   Reclined to sit (R side of table) properly 1x   Sit <> stand from low mat table at chair height, no hands 5x3  Standign with B UE assist   B heel toe raises 5x2 each direction   Difficult per pt.    Improved exercise technique, movement at target joints, use of target muscles after mod verbal, visual, tactile cues.           PATIENT EDUCATION:  Education details: Gait with SPC, POC Person educated: Patient Education method: Explanation, Demonstration, and Verbal cues Education comprehension: verbalized understanding and returned demonstration  HOME EXERCISE PROGRAM: Gait with SPC on L side secondary to R LE functional weakness  Access Code: [email or web address] URL: [email or web address] Date: 05/23/2023 Prepared by: Loralyn Freshwater  Exercises - Seated Gluteal Sets  - 5 x daily - 7 x weekly - 3 sets - 10 reps - 5 seconds hold - Seated Transversus Abdominis Bracing  - 5 x daily - 7 x weekly - 3 sets - 10 reps - 5 seconds hold   ASSESSMENT:  CLINICAL IMPRESSION: Reinforced gait with SPC on L side to decrease difficulty with ambulation. Pt able to perform  gait with the AD well. Worked on core and functional LE strengthening to promote ability to move around at home with less difficulty. Pt tolerated session well without aggravation of symptoms. Pt will benefit from continued skilled physical therapy services to improve strength, balance, and function.       OBJECTIVE IMPAIRMENTS: Abnormal gait, decreased balance, difficulty walking, decreased strength, impaired UE functional use, improper body mechanics, postural dysfunction, and pain.   ACTIVITY LIMITATIONS: carrying, lifting, squatting, sleeping, stairs, dressing, and locomotion level  PARTICIPATION LIMITATIONS:   PERSONAL FACTORS: Age, Fitness, Past/current experiences, Time since onset of injury/illness/exacerbation, and 3+ comorbidities: DM, HTN, hepatitis C, hx of previous neck surgery   are also affecting patient's functional outcome.   REHAB POTENTIAL: Fair    CLINICAL DECISION MAKING: Stable/uncomplicated  EVALUATION COMPLEXITY: Low   GOALS: Goals reviewed with patient? Yes  SHORT TERM GOALS: Target date: 05/31/2023  Pt will be independent with his initial HEP to improve strength, function, and ability to ambulate with less difficulty. Baseline: Pt has not yet formally started his initial HEP (05/16/2023) Goal status: INITIAL    LONG TERM GOALS: Target date: 08/09/2023  Pt will be able to ambulate at least 500 ft without AD safely to promote mobility.  Baseline: Pt currently ambulating with SPC (05/16/2023) Goal status: INITIAL  2.  Pt will improve her Neck Disability Index (NDI)  Score by at least 15 points as a demonstration of improved function.  Baseline: NDI 30/50 (severe disability) (05/16/2023) Goal status: INITIAL  3.  Pt will improve his B UE strength by at least 1/2 MMT to improve ability to perform tasks at home as well as don and doff clothes with less difficulty.  Baseline:  MMT Right eval Left eval  Shoulder flexion (at available range) 3+ 3+  Shoulder  extension    Shoulder abduction 4- 4-  Shoulder adduction    Shoulder extension    Shoulder internal rotation    Shoulder external rotation    Middle trapezius    Lower trapezius  Elbow flexion 4- 4-  Elbow extension 4- 4   (05/16/2023)  Goal status: INITIAL  4.  Pt will improve his B LE strength by at least 1/2 MMT grade to promote ability to ambulate as well as perform standing tasks with less difficulty.  Baseline:  MMT Right eval Left eval  Hip flexion    Hip extension (seated manually resisted) 3 3  Hip abduction (seated manually resisted) 4 4-  Hip adduction    Hip internal rotation    Hip external rotation    Knee flexion 3+ 4-  Knee extension 4 4+  Ankle dorsiflexion    Ankle plantarflexion    Ankle inversion    Ankle eversion     (05/16/2023)  Goal status: INITIAL     PLAN:  PT FREQUENCY: 1-2x/week  PT DURATION: 12 weeks  PLANNED INTERVENTIONS: 97110-Therapeutic exercises, 97530- Therapeutic activity, 97112- Neuromuscular re-education, 97535- Self Care, [id number]- Manual therapy, [id number]- Gait training, [phone]- Aquatic Therapy, [phone]- Electrical stimulation (unattended), [phone]- Ionotophoresis 4mg /ml Dexamethasone, Patient/Family education, Balance training, Stair training, Dry Needling, and Scar mobilization  PLAN FOR NEXT SESSION: posture, gentle functional strengthening, gait training, manual techniques, modalities PRN.    Vandana Haman, PT, DPT 05/23/2023, 11:48 AM

## 2023-05-29 ENCOUNTER — Encounter: Payer: Self-pay | Admitting: Neurosurgery

## 2023-05-30 ENCOUNTER — Ambulatory Visit: Payer: Medicare (Managed Care)

## 2023-05-30 ENCOUNTER — Telehealth: Payer: Self-pay | Admitting: Orthopedic Surgery

## 2023-05-30 DIAGNOSIS — M542 Cervicalgia: Secondary | ICD-10-CM

## 2023-05-30 DIAGNOSIS — M6281 Muscle weakness (generalized): Secondary | ICD-10-CM

## 2023-05-30 DIAGNOSIS — G959 Disease of spinal cord, unspecified: Secondary | ICD-10-CM

## 2023-05-30 DIAGNOSIS — R262 Difficulty in walking, not elsewhere classified: Secondary | ICD-10-CM

## 2023-05-30 DIAGNOSIS — Z981 Arthrodesis status: Secondary | ICD-10-CM

## 2023-05-30 DIAGNOSIS — M5020 Other cervical disc displacement, unspecified cervical region: Secondary | ICD-10-CM

## 2023-05-30 MED ORDER — OXYCODONE HCL 10 MG PO TABS: 10.0000 mg | ORAL_TABLET | Freq: Four times a day (QID) | ORAL | 0 refills | Status: DC | PRN

## 2023-05-30 NOTE — Telephone Encounter (Signed)
 He should wear neck brace to PT. Please let him know.

## 2023-05-30 NOTE — Telephone Encounter (Signed)
 DOS: 04/30/23 ACDF C6-C7, C2-C4 laminectomies, C2-T2 PSF   Refill okay for oxycodone. PMP reviewed and is appropriate.   Directions changed to q 6 hours prn.   May consider going down to oxycodone 5mg  at next refill.   Script sent to his pharmacy. Please let him know.

## 2023-05-30 NOTE — Therapy (Signed)
 OUTPATIENT PHYSICAL THERAPY TREATMENT   Patient Name: Caleb Wall MRN: [id number] DOB:Feb 26, 1955, 68 y.o., male Today's Date: 05/30/2023  END OF SESSION:  PT End of Session - 05/30/23 1120     Visit Number 3    Number of Visits 25    Date for PT Re-Evaluation 08/09/23    PT Start Time 1120    PT Stop Time 1201    PT Time Calculation (min) 41 min    Activity Tolerance Patient tolerated treatment well    Behavior During Therapy WFL for tasks assessed/performed              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    Other secondary kyphosis, cervical region    Prediabetes    Primary open angle glaucoma (POAG) of left eye    Pseudoarthrosis of cervical spine (HCC)    Sepsis secondary to UTI (HCC)    Spondylosis of cervical spine    Past Surgical History:  Procedure Laterality Date   ANTERIOR CERVICAL DECOMP/DISCECTOMY FUSION N/A 04/30/2023   Procedure: C6-7 ANTERIOR CERVICAL DISCECTOMY AND FUSION;  Surgeon: Lovenia Kim, MD;  Location: [facility];  Service: Neurosurgery;  Laterality: N/A;   ANTERIOR CERVICAL DECOMPRESSION/DISCECTOMY FUSION 4 LEVELS N/A 12/26/2020   Procedure: C3-6 ANTERIOR CERVICAL DECOMPRESSION/DISCECTOMY FUSION 3 LEVELS;  Surgeon: Lucy Chris, MD;  Location: [facility];  Service: Neurosurgery;  Laterality: N/A;   BROW LIFT AND BLEPHAROPLASTY Bilateral 12/21/2019   CATARACT EXTRACTION W/ INTRAOCULAR LENS IMPLANT Left 02/05/2019   COLONOSCOPY WITH PROPOFOL N/A 09/29/2021   Procedure: COLONOSCOPY WITH PROPOFOL;  Surgeon: Regis Bill, MD;  Location: [facility];  Service: Endoscopy;  Laterality: N/A;   KNEE SURGERY Left 2002   Meniscus tear    LITHOTRIPSY  12/13/2020   MASS EXCISION N/A 10/11/2022   Procedure: EXCISION MASS CHEST WALL;  Surgeon: Sung Amabile, DO;  Location: [facility];  Service: General;  Laterality: N/A;   ORIF FEMORAL NECK FRACTURE W/ DHS Left 03/06/2020   POSTERIOR CERVICAL FUSION/FORAMINOTOMY N/A 04/30/2023   Procedure: C2-T2 POSTERIOR SPINAL FUSION;  Surgeon: Lovenia Kim, MD;  Location: [facility];  Service: Neurosurgery;  Laterality: N/A;   POSTERIOR CERVICAL LAMINECTOMY N/A 04/30/2023   Procedure: C2-C4 LAMINECTOMY;  Surgeon: Lovenia Kim, MD;  Location: [facility];  Service: Neurosurgery;  Laterality: N/A;   SHOULDER ARTHROSCOPY WITH OPEN ROTATOR CUFF REPAIR AND DISTAL CLAVICLE ACROMINECTOMY Left 07/25/2021   Procedure: SHOULDER ARTHROSCOPY WITH OPEN ROTATOR CUFF REPAIR AND DISTAL CLAVICLE ACROMINECTOMY;  Surgeon: Juanell Fairly, MD;  Location: [facility];  Service: Orthopedics;  Laterality: Left;   TOTAL HIP ARTHROPLASTY Left 03/06/2020   Patient Active Problem List   Diagnosis Date Noted   Pseudoarthrosis of cervical spine (HCC) 04/12/2023   S/P cervical spinal fusion 04/03/2023   Spinal stenosis in cervical region 04/03/2023   Cervical spondylosis 04/03/2023   Right leg weakness 04/03/2023   Right arm weakness 04/03/2023   Cervical spondylosis without myelopathy 04/03/2023   Other secondary kyphosis, cervical region 04/03/2023   Cervical myelopathy (HCC) 12/26/2020   Hypertension 02/23/2014   Plantar wart 02/23/2014   Benign prostatic hyperplasia  02/23/2014   Diabetes (HCC) 04/23/2013    PCP: [facility]   REFERRING PROVIDER: Charlton Amor, PA-C   REFERRING DIAG: G95.9 (ICD-10-CM) - Cervical myelopathy (HCC)  THERAPY DIAG:  Cervicalgia  Difficulty in walking, not elsewhere classified  Muscle weakness (generalized)  Cervical myelopathy (HCC)  Rationale for Evaluation and Treatment: Rehabilitation  ONSET DATE: DOS: 04/30/23 ACDF C6-C7, C2-C4 laminectomies, C2-T2  PSF  SUBJECTIVE:                                                                                                                                                                                                         SUBJECTIVE STATEMENT: Neck still hurts but better than what it was. 5/10 currently.    Hand dominance: Right  PERTINENT HISTORY:  DOS: 04/30/23 ACDF C6-C7, C2-C4 laminectomies, C2-T2 PSF. Neck hurts but not severely. Pt had neck fusions in 2022 levels C3-4. C2 and C3 did not fuse. Pt had PT at the hospital after the surgery and just got out of the hospital last Friday 05/09/2023). Feels like he has to use the bathroom a lot after the surgery. Pt had to learn how to walk after the procedure. Had R LE weakness and had to re-learn how to use his R LE. Has L hamstring weakness too. Was given a cervical collar (at home currently), does not have to put it while at home unless he is outside in the yard or watching TV or working on the computer. Has tingling in his B fingertips. Feels better since his surgery.  Has L sciatic nerve tingling. Pt is used to limping on his R side. Has a 5 lbs lifting restricitions.   Pt fell in 2005 and thought he was paralyzed.    No latex allergies Blood pressure is controlled per pt.   Wife present initially during the eval per pt request.     PAIN:  Are you having pain? Yes: NPRS scale: 5/10 Pain location: neck and shoulders Pain description: sore, ache  Aggravating factors: turning his head Relieving factors: rest  PRECAUTIONS: Cervical  RED FLAGS: Bowel or bladder incontinence: No and Cauda equina syndrome: No     WEIGHT BEARING RESTRICTIONS: No  FALLS:  Has patient fallen in last 6 months? No  LIVING ENVIRONMENT: Lives with: lives with their spouse Lives in: House/apartment Stairs: Yes: External: 3 steps; can reach both Has following equipment at home: Single point cane, Walker - 2 wheeled, Tour manager, and Grab bars  OCCUPATION:  Retired  PLOF: Independent  PATIENT GOALS: Just get better.  NEXT MD VISIT: Jul 12, 2023  OBJECTIVE:  Note: Objective measures were completed at Evaluation unless otherwise noted.  DIAGNOSTIC FINDINGS:    PATIENT SURVEYS:  NDI 30/50 (severe disability) (05/16/2023)  COGNITION: Overall cognitive status: Within functional limits for tasks assessed  SENSATION: Decreased sensation to light touch B hands at tips of all fingers  POSTURE: forward neck, B protracted shoulders R shoulder higher,   PALPATION: TTP neck, and upper trap  Surgical incision healing well.   CERVICAL ROM:   Active ROM A/PROM (deg) eval  Flexion   Extension   Right lateral flexion   Left lateral flexion   Right rotation   Left rotation    (Blank rows = not tested Not tested  UPPER EXTREMITY ROM:  Active ROM Right eval Left eval  Shoulder flexion    Shoulder extension    Shoulder abduction    Shoulder adduction    Shoulder extension    Shoulder internal rotation    Shoulder external rotation    Elbow flexion    Elbow extension    Wrist flexion    Wrist extension    Wrist ulnar deviation    Wrist radial deviation    Wrist pronation    Wrist supination     (Blank rows = not tested)  UPPER EXTREMITY MMT:  MMT Right eval Left eval  Shoulder flexion (at available range) 3+ 3+  Shoulder extension    Shoulder abduction 4- 4-  Shoulder adduction    Shoulder extension    Shoulder internal rotation    Shoulder external rotation    Middle trapezius    Lower trapezius    Elbow flexion 4- 4-  Elbow extension 4- 4  Wrist flexion    Wrist extension    Wrist ulnar deviation    Wrist radial deviation    Wrist pronation    Wrist supination    Grip strength     (Blank rows = not tested)   LOWER EXTREMITY MMT:    MMT Right eval Left eval  Hip flexion    Hip extension (seated manually resisted) 3 3  Hip abduction (seated manually resisted) 4 4-  Hip adduction    Hip internal  rotation    Hip external rotation    Knee flexion 3+ 4-  Knee extension 4 4+  Ankle dorsiflexion    Ankle plantarflexion    Ankle inversion    Ankle eversion     (Blank rows = not tested)     CERVICAL SPECIAL TESTS:  Not applicable  FUNCTIONAL TESTS:    GAIT Gait: SPC on R side, antalgic decreased stance R LE SPC switched to L side to support R LE more. Feels better per pt     TREATMENT DATE: 05/30/2023  Therapeutic exercise Pt wearing a cervical collar.  Seated     B scapular retraction 10x3 with 5 second holds   Transversus abdominis contraction 10x3 with 5 second holds  Standing with B UE assist   Hip abduction    R 10x2   L 10x2    Difficulty to perform secondary to R LE closed chain weakness.   B heel toe raises 5x2 each direction   Reclined   Shoulder flexion to 90 degrees    R 10x3   L 10x3   Shoulder scaption to 90 degrees   R 10x2   L 10x2    Glute squeeze 10x5 seconds for 3 sets  Static standing, CGA  Feet shoulder width apart   Eyes closed 10 seconds for 10x   Feet together, eyes open 10 seconds easy    Then with eyes closed 5 seconds x 10 CGA     Improved exercise technique, movement at target joints, use of target muscles after mod verbal, visual, tactile cues.           PATIENT EDUCATION:  Education details: Gait with SPC, POC Person educated: Patient Education method: Explanation, Demonstration, and Verbal cues Education comprehension: verbalized understanding and returned demonstration  HOME EXERCISE PROGRAM: Gait with SPC on L side secondary to R LE functional weakness  Access Code: [email or web address] URL: [email or web address] Date: 05/23/2023 Prepared by: Loralyn Freshwater  Exercises - Seated Gluteal Sets  - 5 x daily - 7 x weekly - 3 sets - 10 reps - 5 seconds hold - Seated  Transversus Abdominis Bracing  - 5 x daily - 7 x weekly - 3 sets - 10 reps - 5 seconds hold   ASSESSMENT:  CLINICAL IMPRESSION:  Worked on UE flexion and scaption ROM as well as scapular muscle activation to promote ability to reach with less difficulty. Continued working on core and glute strengthening as well as balance to promote ability to ambulate and perform standing tasks safely and with less difficulty. Pt tolerated session well without aggravation of symptoms. Pt will benefit from continued skilled physical therapy services to improve strength, balance, and function.       OBJECTIVE IMPAIRMENTS: Abnormal gait, decreased balance, difficulty walking, decreased strength, impaired UE functional use, improper body mechanics, postural dysfunction, and pain.   ACTIVITY LIMITATIONS: carrying, lifting, squatting, sleeping, stairs, dressing, and locomotion level  PARTICIPATION LIMITATIONS:   PERSONAL FACTORS: Age, Fitness, Past/current experiences, Time since onset of injury/illness/exacerbation, and 3+ comorbidities: DM, HTN, hepatitis C, hx of previous neck surgery   are also affecting patient's functional outcome.   REHAB POTENTIAL: Fair    CLINICAL DECISION MAKING: Stable/uncomplicated  EVALUATION COMPLEXITY: Low   GOALS: Goals reviewed with patient? Yes  SHORT TERM GOALS: Target date: 05/31/2023  Pt will be independent with his initial HEP to improve strength, function, and ability to ambulate with less difficulty. Baseline: Pt has not yet formally started his initial HEP (05/16/2023) Goal status: INITIAL    LONG TERM GOALS: Target date: 08/09/2023  Pt will be able to ambulate at least 500 ft without AD safely to promote mobility.  Baseline: Pt currently ambulating with SPC (05/16/2023) Goal status: INITIAL  2.  Pt will improve her Neck Disability Index (NDI)  Score by at least 15 points as a demonstration of improved function.  Baseline: NDI 30/50 (severe disability)  (05/16/2023) Goal status: INITIAL  3.  Pt will improve his B UE strength by at least 1/2 MMT to improve ability to perform tasks at  home as well as don and doff clothes with less difficulty.  Baseline:  MMT Right eval Left eval  Shoulder flexion (at available range) 3+ 3+  Shoulder extension    Shoulder abduction 4- 4-  Shoulder adduction    Shoulder extension    Shoulder internal rotation    Shoulder external rotation    Middle trapezius    Lower trapezius    Elbow flexion 4- 4-  Elbow extension 4- 4   (05/16/2023)  Goal status: INITIAL  4.  Pt will improve his B LE strength by at least 1/2 MMT grade to promote ability to ambulate as well as perform standing tasks with less difficulty.  Baseline:  MMT Right eval Left eval  Hip flexion    Hip extension (seated manually resisted) 3 3  Hip abduction (seated manually resisted) 4 4-  Hip adduction    Hip internal rotation    Hip external rotation    Knee flexion 3+ 4-  Knee extension 4 4+  Ankle dorsiflexion    Ankle plantarflexion    Ankle inversion    Ankle eversion     (05/16/2023)  Goal status: INITIAL     PLAN:  PT FREQUENCY: 1-2x/week  PT DURATION: 12 weeks  PLANNED INTERVENTIONS: 97110-Therapeutic exercises, 97530- Therapeutic activity, 97112- Neuromuscular re-education, 97535- Self Care, [id number]- Manual therapy, [id number]- Gait training, [phone]- Aquatic Therapy, [phone]- Electrical stimulation (unattended), [phone]- Ionotophoresis 4mg /ml Dexamethasone, Patient/Family education, Balance training, Stair training, Dry Needling, and Scar mobilization  PLAN FOR NEXT SESSION: posture, gentle functional strengthening, gait training, manual techniques, modalities PRN.    Everli Rother, PT, DPT 05/30/2023, 12:13 PM

## 2023-05-31 NOTE — Telephone Encounter (Signed)
 Patient called to find out which neck brace he should be wearing the plastic or the foam. He has several other questions regarding how often and when he should be wearing the brace.

## 2023-05-31 NOTE — Telephone Encounter (Signed)
 Patient educated to wear soft cervical collar while up and walking.

## 2023-06-06 ENCOUNTER — Ambulatory Visit: Payer: Medicare (Managed Care)

## 2023-06-06 DIAGNOSIS — G959 Disease of spinal cord, unspecified: Secondary | ICD-10-CM

## 2023-06-06 DIAGNOSIS — R262 Difficulty in walking, not elsewhere classified: Secondary | ICD-10-CM

## 2023-06-06 DIAGNOSIS — M6281 Muscle weakness (generalized): Secondary | ICD-10-CM

## 2023-06-06 DIAGNOSIS — M542 Cervicalgia: Secondary | ICD-10-CM | POA: Diagnosis not present

## 2023-06-06 NOTE — Therapy (Signed)
 OUTPATIENT PHYSICAL THERAPY TREATMENT   Patient Name: Sherley Leser MRN: [id number] DOB:13-Oct-1955, 68 y.o., male Today's Date: 06/06/2023  END OF SESSION:  PT End of Session - 06/06/23 1146     Visit Number 4    Number of Visits 25    Date for PT Re-Evaluation 08/09/23    PT Start Time 1118    PT Stop Time 1200    PT Time Calculation (min) 42 min    Activity Tolerance Patient tolerated treatment well    Behavior During Therapy WFL for tasks assessed/performed              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    Other secondary kyphosis, cervical region    Prediabetes    Primary open angle glaucoma (POAG) of left eye    Pseudoarthrosis of cervical spine (HCC)    Sepsis secondary to UTI (HCC)    Spondylosis of cervical spine    Past Surgical History:  Procedure Laterality Date   ANTERIOR CERVICAL DECOMP/DISCECTOMY FUSION N/A 04/30/2023   Procedure: C6-7 ANTERIOR CERVICAL DISCECTOMY AND FUSION;  Surgeon: Carroll Clamp, MD;  Location: [facility];  Service: Neurosurgery;  Laterality: N/A;   ANTERIOR CERVICAL DECOMPRESSION/DISCECTOMY FUSION 4 LEVELS N/A 12/26/2020   Procedure: C3-6 ANTERIOR CERVICAL DECOMPRESSION/DISCECTOMY FUSION 3 LEVELS;  Surgeon: Berta Brittle, MD;  Location: [facility];  Service: Neurosurgery;  Laterality: N/A;   BROW LIFT AND BLEPHAROPLASTY Bilateral 12/21/2019   CATARACT EXTRACTION W/ INTRAOCULAR LENS IMPLANT Left 02/05/2019   COLONOSCOPY WITH PROPOFOL N/A 09/29/2021   Procedure: COLONOSCOPY WITH PROPOFOL;  Surgeon: Shane Darling, MD;  Location: [facility];  Service: Endoscopy;  Laterality: N/A;   KNEE SURGERY Left 2002   Meniscus tear    LITHOTRIPSY  12/13/2020   MASS EXCISION N/A 10/11/2022   Procedure: EXCISION MASS CHEST WALL;  Surgeon: Conrado Delay, DO;  Location: [facility];  Service: General;  Laterality: N/A;   ORIF FEMORAL NECK FRACTURE W/ DHS Left 03/06/2020   POSTERIOR CERVICAL FUSION/FORAMINOTOMY N/A 04/30/2023   Procedure: C2-T2 POSTERIOR SPINAL FUSION;  Surgeon: Carroll Clamp, MD;  Location: [facility];  Service: Neurosurgery;  Laterality: N/A;   POSTERIOR CERVICAL LAMINECTOMY N/A 04/30/2023   Procedure: C2-C4 LAMINECTOMY;  Surgeon: Carroll Clamp, MD;  Location: [facility];  Service: Neurosurgery;  Laterality: N/A;   SHOULDER ARTHROSCOPY WITH OPEN ROTATOR CUFF REPAIR AND DISTAL CLAVICLE ACROMINECTOMY Left 07/25/2021   Procedure: SHOULDER ARTHROSCOPY WITH OPEN ROTATOR CUFF REPAIR AND DISTAL CLAVICLE ACROMINECTOMY;  Surgeon: Rande Bushy, MD;  Location: [facility];  Service: Orthopedics;  Laterality: Left;   TOTAL HIP ARTHROPLASTY Left 03/06/2020   Patient Active Problem List   Diagnosis Date Noted   Pseudoarthrosis of cervical spine (HCC) 04/12/2023   S/P cervical spinal fusion 04/03/2023   Spinal stenosis in cervical region 04/03/2023   Cervical spondylosis 04/03/2023   Right leg weakness 04/03/2023   Right arm weakness 04/03/2023   Cervical spondylosis without myelopathy 04/03/2023   Other secondary kyphosis, cervical region 04/03/2023   Cervical myelopathy (HCC) 12/26/2020   Hypertension 02/23/2014   Plantar wart 02/23/2014   Benign prostatic hyperplasia  02/23/2014   Diabetes (HCC) 04/23/2013    PCP: [facility]   REFERRING PROVIDER: Sterling Eisenmenger, PA-C   REFERRING DIAG: G95.9 (ICD-10-CM) - Cervical myelopathy (HCC)  THERAPY DIAG:  Cervicalgia  Difficulty in walking, not elsewhere classified  Muscle weakness (generalized)  Cervical myelopathy (HCC)  Rationale for Evaluation and Treatment: Rehabilitation  ONSET DATE: DOS: 04/30/23 ACDF C6-C7, C2-C4 laminectomies, C2-T2  PSF  SUBJECTIVE:                                                                                                                                                                                                         SUBJECTIVE STATEMENT:  Pt reports that today is a fatigue day.  Pt reports that his pain level is 3/10 but only because he took a half oxycodone.  Pt notes that the pain was 6/10 this morning.   Hand dominance: Right  PERTINENT HISTORY:  DOS: 04/30/23 ACDF C6-C7, C2-C4 laminectomies, C2-T2 PSF. Neck hurts but not severely. Pt had neck fusions in 2022 levels C3-4. C2 and C3 did not fuse. Pt had PT at the hospital after the surgery and just got out of the hospital last Friday 05/09/2023). Feels like he has to use the bathroom a lot after the surgery. Pt had to learn how to walk after the procedure. Had R LE weakness and had to re-learn how to use his R LE. Has L hamstring weakness too. Was given a cervical collar (at home currently), does not have to put it while at home unless he is outside in the yard or watching TV or working on the computer. Has tingling in his B fingertips. Feels better since his surgery.  Has L sciatic nerve tingling. Pt is used to limping on his R side. Has a 5 lbs lifting restricitions.   Pt fell in 2005 and thought he was paralyzed.    No latex allergies Blood pressure is controlled per pt.   Wife present initially during the eval per pt request.     PAIN:  Are you having pain? Yes: NPRS scale: 5/10 Pain location: neck and shoulders Pain description: sore, ache  Aggravating factors: turning his head Relieving factors: rest  PRECAUTIONS: Cervical  RED FLAGS: Bowel or bladder incontinence: No and Cauda equina syndrome: No     WEIGHT BEARING RESTRICTIONS: No  FALLS:  Has patient fallen in last 6 months? No  LIVING ENVIRONMENT: Lives with: lives with their spouse Lives in: House/apartment Stairs: Yes: External: 3 steps; can reach both Has  following equipment at home: Single point  cane, Walker - 2 wheeled, Tour manager, and Grab bars  OCCUPATION: Retired  PLOF: Independent  PATIENT GOALS: Just get better.   NEXT MD VISIT: Jul 12, 2023  OBJECTIVE:  Note: Objective measures were completed at Evaluation unless otherwise noted.  DIAGNOSTIC FINDINGS:    PATIENT SURVEYS:  NDI 30/50 (severe disability) (05/16/2023)  COGNITION: Overall cognitive status: Within functional limits for tasks assessed  SENSATION: Decreased sensation to light touch B hands at tips of all fingers  POSTURE: forward neck, B protracted shoulders R shoulder higher,   PALPATION: TTP neck, and upper trap  Surgical incision healing well.   CERVICAL ROM:   Active ROM A/PROM (deg) eval  Flexion   Extension   Right lateral flexion   Left lateral flexion   Right rotation   Left rotation    (Blank rows = not tested Not tested  UPPER EXTREMITY ROM:  Active ROM Right eval Left eval  Shoulder flexion    Shoulder extension    Shoulder abduction    Shoulder adduction    Shoulder extension    Shoulder internal rotation    Shoulder external rotation    Elbow flexion    Elbow extension    Wrist flexion    Wrist extension    Wrist ulnar deviation    Wrist radial deviation    Wrist pronation    Wrist supination     (Blank rows = not tested)  UPPER EXTREMITY MMT:  MMT Right eval Left eval  Shoulder flexion (at available range) 3+ 3+  Shoulder extension    Shoulder abduction 4- 4-  Shoulder adduction    Shoulder extension    Shoulder internal rotation    Shoulder external rotation    Middle trapezius    Lower trapezius    Elbow flexion 4- 4-  Elbow extension 4- 4  Wrist flexion    Wrist extension    Wrist ulnar deviation    Wrist radial deviation    Wrist pronation    Wrist supination    Grip strength     (Blank rows = not tested)   LOWER EXTREMITY MMT:    MMT Right eval Left eval  Hip flexion    Hip extension  (seated manually resisted) 3 3  Hip abduction (seated manually resisted) 4 4-  Hip adduction    Hip internal rotation    Hip external rotation    Knee flexion 3+ 4-  Knee extension 4 4+  Ankle dorsiflexion    Ankle plantarflexion    Ankle inversion    Ankle eversion     (Blank rows = not tested)     CERVICAL SPECIAL TESTS:  Not applicable  FUNCTIONAL TESTS:    GAIT Gait: SPC on R side, antalgic decreased stance R LE SPC switched to L side to support R LE more. Feels better per pt     TREATMENT DATE: 06/06/23  Therapeutic Exercise  Pt wearing a cervical collar.  Seated:    B scapular retraction 3x10 with 5 second holds   Transversus abdominis contraction 10x3 with 5 second holds  Standing with B UE assist   Hip abduction    R 10x2   L 10x2  B heel/toe raises 5x2 each direction     Improved exercise technique, movement at target joints, use of target muscles after mod verbal, visual, tactile cues.           PATIENT EDUCATION:  Education details: Gait with SPC, POC Person educated: Patient Education method: Explanation, Demonstration, and Verbal cues Education comprehension: verbalized understanding and returned demonstration  HOME EXERCISE PROGRAM: Gait with SPC on L side secondary to R LE functional weakness  Access Code: [email or web address] URL: [email or web address] Date: 05/23/2023 Prepared by: Suzzane Estes  Exercises - Seated Gluteal Sets  - 5 x daily - 7 x weekly - 3 sets - 10 reps - 5 seconds hold - Seated Transversus Abdominis Bracing  - 5 x daily - 7 x weekly - 3 sets - 10 reps - 5 seconds hold   ASSESSMENT:  CLINICAL IMPRESSION:  Pt responded well to the exercises, however noted to have need to leave early for another appointment.  Pt put forth good effort throughout the session in the time that  was available and demonstrated improved technique with the exercises performed.   Pt will continue to benefit from skilled therapy to address remaining deficits in order to improve overall QoL and return to PLOF.         OBJECTIVE IMPAIRMENTS: Abnormal gait, decreased balance, difficulty walking, decreased strength, impaired UE functional use, improper body mechanics, postural dysfunction, and pain.   ACTIVITY LIMITATIONS: carrying, lifting, squatting, sleeping, stairs, dressing, and locomotion level  PARTICIPATION LIMITATIONS:   PERSONAL FACTORS: Age, Fitness, Past/current experiences, Time since onset of injury/illness/exacerbation, and 3+ comorbidities: DM, HTN, hepatitis C, hx of previous neck surgery   are also affecting patient's functional outcome.   REHAB POTENTIAL: Fair    CLINICAL DECISION MAKING: Stable/uncomplicated  EVALUATION COMPLEXITY: Low   GOALS: Goals reviewed with patient? Yes  SHORT TERM GOALS: Target date: 05/31/2023  Pt will be independent with his initial HEP to improve strength, function, and ability to ambulate with less difficulty. Baseline: Pt has not yet formally started his initial HEP (05/16/2023) Goal status: INITIAL    LONG TERM GOALS: Target date: 08/09/2023  Pt will be able to ambulate at least 500 ft without AD safely to promote mobility.  Baseline: Pt currently ambulating with SPC (05/16/2023) Goal status: INITIAL  2.  Pt will improve her Neck Disability Index (NDI)  Score by at least 15 points as a demonstration of improved function.  Baseline: NDI 30/50 (severe disability) (05/16/2023) Goal status: INITIAL  3.  Pt will improve his B UE strength by at least 1/2 MMT to improve ability to perform tasks at home as well as don and doff clothes with less difficulty.  Baseline:  MMT Right eval Left eval  Shoulder flexion (at available range) 3+ 3+  Shoulder extension    Shoulder abduction 4- 4-  Shoulder adduction    Shoulder extension     Shoulder internal rotation    Shoulder external rotation    Middle trapezius    Lower trapezius    Elbow flexion 4- 4-  Elbow extension 4- 4   (05/16/2023)  Goal status: INITIAL  4.  Pt will improve his B LE strength by at least 1/2  MMT grade to promote ability to ambulate as well as perform standing tasks with less difficulty.  Baseline:  MMT Right eval Left eval  Hip flexion    Hip extension (seated manually resisted) 3 3  Hip abduction (seated manually resisted) 4 4-  Hip adduction    Hip internal rotation    Hip external rotation    Knee flexion 3+ 4-  Knee extension 4 4+  Ankle dorsiflexion    Ankle plantarflexion    Ankle inversion    Ankle eversion     (05/16/2023)  Goal status: INITIAL     PLAN:  PT FREQUENCY: 1-2x/week  PT DURATION: 12 weeks  PLANNED INTERVENTIONS: 97110-Therapeutic exercises, 97530- Therapeutic activity, 97112- Neuromuscular re-education, 97535- Self Care, 96045- Manual therapy, [id number]- Gait training, [phone]- Aquatic Therapy, [phone]- Electrical stimulation (unattended), [phone]- Ionotophoresis 4mg /ml Dexamethasone, Patient/Family education, Balance training, Stair training, Dry Needling, and Scar mobilization  PLAN FOR NEXT SESSION: posture, gentle functional strengthening, gait training, manual techniques, modalities PRN.    Rozanna Corner, PT, DPT Physical Therapist - [facility]  06/06/23, 11:47 AM

## 2023-06-11 ENCOUNTER — Telehealth: Payer: Self-pay | Admitting: Neurosurgery

## 2023-06-11 ENCOUNTER — Other Ambulatory Visit: Payer: Self-pay | Admitting: Family Medicine

## 2023-06-11 DIAGNOSIS — M4712 Other spondylosis with myelopathy, cervical region: Secondary | ICD-10-CM

## 2023-06-11 NOTE — Telephone Encounter (Signed)
 Patient requested a refill of Oxycodone  when I called to remind him of his appointment. Loyce Ruffini mentioned decreasing to 5mg  for this refill in a telephone message on 05/30/2023 and patient is aware and states this is fine as he has been cutting the 10mg  in half any ways.   [facility]

## 2023-06-12 ENCOUNTER — Encounter: Payer: Self-pay | Admitting: Neurosurgery

## 2023-06-12 ENCOUNTER — Ambulatory Visit
Admission: RE | Admit: 2023-06-12 | Discharge: 2023-06-12 | Disposition: A | Discharge status: Home / Self Care | Payer: Medicare (Managed Care) | Attending: Neurosurgery | Admitting: Neurosurgery

## 2023-06-12 ENCOUNTER — Ambulatory Visit
Admission: RE | Admit: 2023-06-12 | Discharge: 2023-06-12 | Disposition: A | Discharge status: Home / Self Care | Payer: Medicare (Managed Care) | Source: Ambulatory Visit | Attending: Neurosurgery | Admitting: Neurosurgery

## 2023-06-12 ENCOUNTER — Ambulatory Visit (INDEPENDENT_AMBULATORY_CARE_PROVIDER_SITE_OTHER): Payer: Medicare (Managed Care) | Admitting: Neurosurgery

## 2023-06-12 ENCOUNTER — Other Ambulatory Visit: Payer: Self-pay | Admitting: Physician Assistant

## 2023-06-12 DIAGNOSIS — M4712 Other spondylosis with myelopathy, cervical region: Secondary | ICD-10-CM | POA: Diagnosis present

## 2023-06-12 DIAGNOSIS — Z981 Arthrodesis status: Secondary | ICD-10-CM

## 2023-06-12 DIAGNOSIS — G959 Disease of spinal cord, unspecified: Secondary | ICD-10-CM

## 2023-06-12 DIAGNOSIS — M5020 Other cervical disc displacement, unspecified cervical region: Secondary | ICD-10-CM

## 2023-06-12 MED ORDER — OXYCODONE HCL 10 MG PO TABS: 10.0000 mg | ORAL_TABLET | Freq: Four times a day (QID) | ORAL | 0 refills | Status: DC | PRN

## 2023-06-12 MED ORDER — OXYCODONE HCL 10 MG PO TABS: 5.0000 mg | ORAL_TABLET | Freq: Four times a day (QID) | ORAL | 0 refills | Status: AC | PRN

## 2023-06-12 NOTE — Progress Notes (Signed)
   REFERRING PHYSICIAN:  [facility] [address] [place],  [place] [address]  DOS: 04/30/23 ACDF C6-C7, C2-C4 laminectomies, C2-T2 PSF  HISTORY OF PRESENT ILLNESS: Caleb Wall is 6 weeks status post above surgery.  He was previously in rehab but discharged on 05/10/2023.  Overall he states that he is feeling much better.  His numbness tingling in his arms is getting better.  His gait is getting better.  His arm pain is improving.  He states that he has decreased the amount of medication that he is needed to take.  He has been progressing steadily with his rehabilitative efforts.  Overall he is pleased with his outcome so far.  PHYSICAL EXAMINATION:  NEUROLOGICAL:  General: In no acute distress.   Awake, alert, oriented to person, place, and time.  Pupils equal round and reactive to light.  Facial tone is symmetric.    Strength: Side Biceps Triceps Deltoid Interossei Grip Wrist Ext. Wrist Flex.  R 5 4+ 4+ 5 5 5 5   L 5 5 4+ 5 5 5 5    Side Iliopsoas Quads Hamstring PF DF EHL  R 5 5 5 5 5 5   L 5 5 5 5 5 5     Incisions c/d/I  Ambulating without his walker at this point.  Imaging:  Imaging reviewed by me, not yet formally read.  Appears to be stable hardware with no evidence of acute breakdown.  Assessment / Plan: Caleb Wall is doing well s/p above surgery. Treatment options reviewed with patient and following plan made:   - We discussed activity escalation and I have advised the patient to lift up to 25 pounds until 15 weeks after surgery  - Collar for comfort only, not necessary for stability. - Continue to work physical therapy - Continue to follow-up as scheduled  Advised to contact the office if any questions or concerns arise.   Carroll Clamp, MD Dept of Neurosurgery

## 2023-06-12 NOTE — Progress Notes (Signed)
 Oxycodone refill

## 2023-06-19 ENCOUNTER — Ambulatory Visit: Payer: Medicare (Managed Care)

## 2023-06-19 DIAGNOSIS — M6281 Muscle weakness (generalized): Secondary | ICD-10-CM

## 2023-06-19 DIAGNOSIS — M542 Cervicalgia: Secondary | ICD-10-CM

## 2023-06-19 DIAGNOSIS — R262 Difficulty in walking, not elsewhere classified: Secondary | ICD-10-CM

## 2023-06-19 DIAGNOSIS — G959 Disease of spinal cord, unspecified: Secondary | ICD-10-CM

## 2023-06-19 NOTE — Therapy (Signed)
 OUTPATIENT PHYSICAL THERAPY TREATMENT   Patient Name: Caleb Wall MRN: [id number] DOB:1955-10-05, 68 y.o., male Today's Date: 06/19/2023  END OF SESSION:  PT End of Session - 06/19/23 0954     Visit Number 5    Number of Visits 25    Date for PT Re-Evaluation 08/09/23    PT Start Time 0953    PT Stop Time 1028    PT Time Calculation (min) 35 min    Activity Tolerance Patient tolerated treatment well    Behavior During Therapy [facility] for tasks assessed/performed               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    Other secondary kyphosis, cervical region    Prediabetes    Primary open angle glaucoma (POAG) of left eye    Pseudoarthrosis of cervical spine (HCC)    Sepsis secondary to UTI (HCC)    Spondylosis of cervical spine    Past Surgical History:  Procedure Laterality Date   ANTERIOR CERVICAL DECOMP/DISCECTOMY FUSION N/A 04/30/2023   Procedure: C6-7 ANTERIOR CERVICAL DISCECTOMY AND FUSION;  Surgeon: Carroll Clamp, MD;  Location: [facility] ORS;  Service: Neurosurgery;  Laterality: N/A;   ANTERIOR CERVICAL DECOMPRESSION/DISCECTOMY FUSION 4 LEVELS N/A 12/26/2020   Procedure: C3-6 ANTERIOR CERVICAL DECOMPRESSION/DISCECTOMY FUSION 3 LEVELS;  Surgeon: Berta Brittle, MD;  Location: [facility] ORS;  Service: Neurosurgery;  Laterality: N/A;   BROW LIFT AND BLEPHAROPLASTY Bilateral 12/21/2019   CATARACT EXTRACTION W/ INTRAOCULAR LENS IMPLANT Left 02/05/2019   COLONOSCOPY WITH PROPOFOL  N/A 09/29/2021   Procedure: COLONOSCOPY WITH PROPOFOL ;  Surgeon: Shane Darling, MD;  Location: [facility] ENDOSCOPY;  Service: Endoscopy;  Laterality: N/A;   KNEE SURGERY Left 2002   Meniscus tear    LITHOTRIPSY  12/13/2020   MASS EXCISION N/A 10/11/2022   Procedure: EXCISION MASS CHEST WALL;  Surgeon: Conrado Delay, DO;  Location: [facility] ORS;  Service: General;  Laterality: N/A;   ORIF FEMORAL NECK FRACTURE W/ DHS Left 03/06/2020   POSTERIOR CERVICAL FUSION/FORAMINOTOMY N/A 04/30/2023   Procedure: C2-T2 POSTERIOR SPINAL FUSION;  Surgeon: Carroll Clamp, MD;  Location: [facility] ORS;  Service: Neurosurgery;  Laterality: N/A;   POSTERIOR CERVICAL LAMINECTOMY N/A 04/30/2023   Procedure: C2-C4 LAMINECTOMY;  Surgeon: Carroll Clamp, MD;  Location: [facility] OR[facility];  Service: Neurosurgery;  Laterality: N/A;   SHOULDER ARTHROSCOPY WITH OPEN ROTATOR CUFF REPAIR AND DISTAL CLAVICLE ACROMINECTOMY Left 07/25/2021   Procedure: SHOULDER ARTHROSCOPY WITH OPEN ROTATOR CUFF REPAIR AND DISTAL CLAVICLE ACROMINECTOMY;  Surgeon: Rande Bushy, MD;  Location: [facility] ORS;  Service: Orthopedics;  Laterality: Left;   TOTAL HIP ARTHROPLASTY Left 03/06/2020   Patient Active Problem List   Diagnosis Date Noted   Pseudoarthrosis of cervical spine (HCC) 04/12/2023   S/P cervical spinal fusion 04/03/2023   Spinal stenosis in cervical region 04/03/2023   Cervical spondylosis 04/03/2023   Right leg weakness 04/03/2023   Right arm weakness 04/03/2023   Cervical spondylosis without myelopathy 04/03/2023   Other secondary kyphosis, cervical region 04/03/2023   Cervical myelopathy (HCC) 12/26/2020   Hypertension 02/23/2014   Plantar wart 02/23/2014   Benign prostatic  hyperplasia 02/23/2014   Diabetes (HCC) 04/23/2013    PCP: [facility]   REFERRING PROVIDER: Sterling Eisenmenger, PA-C   REFERRING DIAG: G95.9 (ICD-10-CM) - Cervical myelopathy (HCC)  THERAPY DIAG:  Cervicalgia  Difficulty in walking, not elsewhere classified  Muscle weakness (generalized)  Cervical myelopathy (HCC)  Rationale for Evaluation and Treatment: Rehabilitation  ONSET DATE: DOS: 04/30/23 ACDF C6-C7, C2-C4 laminectomies, C2-T2  PSF  SUBJECTIVE:                                                                                                                                                                                                         SUBJECTIVE STATEMENT:    Patient reports feeling good today with his typical pain in the posterior neck.    Hand dominance: Right  PERTINENT HISTORY:  DOS: 04/30/23 ACDF C6-C7, C2-C4 laminectomies, C2-T2 PSF. Neck hurts but not severely. Pt had neck fusions in 2022 levels C3-4. C2 and C3 did not fuse. Pt had PT at the hospital after the surgery and just got out of the hospital last Friday 05/09/2023). Feels like he has to use the bathroom a lot after the surgery. Pt had to learn how to walk after the procedure. Had R LE weakness and had to re-learn how to use his R LE. Has L hamstring weakness too. Was given a cervical collar (at home currently), does not have to put it while at home unless he is outside in the yard or watching TV or working on the computer. Has tingling in his B fingertips. Feels better since his surgery.  Has L sciatic nerve tingling. Pt is used to limping on his R side. Has a 5 lbs lifting restricitions.   Pt fell in 2005 and thought he was paralyzed.    No latex allergies Blood pressure is controlled per pt.   Wife present initially during the eval per pt request.     PAIN:  Are you having pain? Yes: NPRS scale: 5/10 Pain location: neck and shoulders Pain description: sore, ache  Aggravating factors: turning his head Relieving factors: rest  PRECAUTIONS: Cervical  RED FLAGS: Bowel or bladder incontinence: No and Cauda equina syndrome: No     WEIGHT BEARING RESTRICTIONS: No  FALLS:  Has patient fallen in last 6 months? No  LIVING ENVIRONMENT: Lives with: lives with their spouse Lives in: House/apartment Stairs: Yes: External: 3 steps; can reach both Has following equipment at home: Single point cane, [name] - 2 wheeled, Tour manager, and  Grab bars  OCCUPATION: Retired  PLOF: Independent  PATIENT  GOALS: Just get better.   NEXT MD VISIT: Jul 12, 2023  OBJECTIVE:  Note: Objective measures were completed at Evaluation unless otherwise noted.  DIAGNOSTIC FINDINGS:    PATIENT SURVEYS:  NDI 30/50 (severe disability) (05/16/2023)  COGNITION: Overall cognitive status: Within functional limits for tasks assessed  SENSATION: Decreased sensation to light touch B hands at tips of all fingers  POSTURE: forward neck, B protracted shoulders R shoulder higher,   PALPATION: TTP neck, and upper trap  Surgical incision healing well.   CERVICAL ROM:   Active ROM A/PROM (deg) eval  Flexion   Extension   Right lateral flexion   Left lateral flexion   Right rotation   Left rotation    (Blank rows = not tested Not tested  UPPER EXTREMITY ROM:  Active ROM Right eval Left eval  Shoulder flexion    Shoulder extension    Shoulder abduction    Shoulder adduction    Shoulder extension    Shoulder internal rotation    Shoulder external rotation    Elbow flexion    Elbow extension    Wrist flexion    Wrist extension    Wrist ulnar deviation    Wrist radial deviation    Wrist pronation    Wrist supination     (Blank rows = not tested)  UPPER EXTREMITY MMT:  MMT Right eval Left eval  Shoulder flexion (at available range) 3+ 3+  Shoulder extension    Shoulder abduction 4- 4-  Shoulder adduction    Shoulder extension    Shoulder internal rotation    Shoulder external rotation    Middle trapezius    Lower trapezius    Elbow flexion 4- 4-  Elbow extension 4- 4  Wrist flexion    Wrist extension    Wrist ulnar deviation    Wrist radial deviation    Wrist pronation    Wrist supination    Grip strength     (Blank rows = not tested)   LOWER EXTREMITY MMT:    MMT Right eval Left eval  Hip flexion    Hip extension (seated manually resisted) 3 3  Hip abduction (seated manually resisted) 4 4-  Hip  adduction    Hip internal rotation    Hip external rotation    Knee flexion 3+ 4-  Knee extension 4 4+  Ankle dorsiflexion    Ankle plantarflexion    Ankle inversion    Ankle eversion     (Blank rows = not tested)     CERVICAL SPECIAL TESTS:  Not applicable  FUNCTIONAL TESTS:    GAIT Gait: SPC on R side, antalgic decreased stance R LE SPC switched to L side to support R LE more. Feels better per pt     TREATMENT DATE: 06/19/23  Therapeutic Exercise  Nustep level 2 x 5 minutes to improve BLE strength and cardiorespiratory endurance   Sit to stand 2 x 10 with no UE support  Seated LAQ 3# AW 2 x 10 each LE with 3 second hold  Standing 6" step taps 3# AW on each LE 2 x 10 Resisted gait with 3# AW x 200' with   Improved exercise technique, movement at target joints, use of target muscles after mod verbal, visual, tactile cues.   PATIENT EDUCATION:  Education details: Gait with SPC, POC Person educated: Patient Education method: Explanation, Demonstration, and Verbal cues Education comprehension: verbalized understanding and returned demonstration  HOME EXERCISE PROGRAM: Gait with SPC on L side secondary to R LE functional weakness  Access Code: [email or web address] URL: [email or web address] Date: 05/23/2023 Prepared by: Suzzane Estes  Exercises - Seated Gluteal Sets  - 5 x daily - 7 x weekly - 3 sets - 10 reps - 5 seconds hold - Seated Transversus Abdominis Bracing  - 5 x daily - 7 x weekly - 3 sets - 10 reps - 5 seconds hold   ASSESSMENT:  CLINICAL IMPRESSION:   Patient arrives to treatment session motivated to participate. Session focused on BLE strengthening with resistance. Eager to continue with BLE strengthening to return to PLOF. Pt will continue to benefit from skilled therapy to address remaining deficits in order to improve  overall QoL and return to PLOF.    OBJECTIVE IMPAIRMENTS: Abnormal gait, decreased balance, difficulty walking, decreased strength, impaired UE functional use, improper body mechanics, postural dysfunction, and pain.   ACTIVITY LIMITATIONS: carrying, lifting, squatting, sleeping, stairs, dressing, and locomotion level  PARTICIPATION LIMITATIONS:   PERSONAL FACTORS: Age, Fitness, Past/current experiences, Time since onset of injury/illness/exacerbation, and 3+ comorbidities: DM, HTN, hepatitis C, hx of previous neck surgery   are also affecting patient's functional outcome.   REHAB POTENTIAL: Fair    CLINICAL DECISION MAKING: Stable/uncomplicated  EVALUATION COMPLEXITY: Low   GOALS: Goals reviewed with patient? Yes  SHORT TERM GOALS: Target date: 05/31/2023  Pt will be independent with his initial HEP to improve strength, function, and ability to ambulate with less difficulty. Baseline: Pt has not yet formally started his initial HEP (05/16/2023) Goal status: INITIAL    LONG TERM GOALS: Target date: 08/09/2023  Pt will be able to ambulate at least 500 ft without AD safely to promote mobility.  Baseline: Pt currently ambulating with SPC (05/16/2023) Goal status: INITIAL  2.  Pt will improve her Neck Disability Index (NDI)  Score by at least 15 points as a demonstration of improved function.  Baseline: NDI 30/50 (severe disability) (05/16/2023) Goal status: INITIAL  3.  Pt will improve his B UE strength by at least 1/2 MMT to improve ability to perform tasks at home as well as don and doff clothes with less difficulty.  Baseline:  MMT Right eval Left eval  Shoulder flexion (at available range) 3+ 3+  Shoulder extension    Shoulder abduction 4- 4-  Shoulder adduction    Shoulder extension    Shoulder internal rotation    Shoulder external rotation    Middle trapezius    Lower trapezius    Elbow flexion 4- 4-  Elbow extension 4- 4   (05/16/2023)  Goal status:  INITIAL  4.  Pt will improve his B LE strength by at least 1/2 MMT grade to promote ability to ambulate as well as perform standing tasks with less difficulty.  Baseline:  MMT Right eval Left eval  Hip flexion    Hip extension (seated manually resisted) 3 3  Hip abduction (seated manually resisted) 4 4-  Hip adduction    Hip internal rotation    Hip external rotation    Knee flexion 3+ 4-  Knee extension 4 4+  Ankle dorsiflexion    Ankle plantarflexion    Ankle inversion    Ankle eversion     (05/16/2023)  Goal status: INITIAL     PLAN:  PT FREQUENCY: 1-2x/week  PT DURATION: 12 weeks  PLANNED INTERVENTIONS: 97110-Therapeutic exercises, 97530- Therapeutic activity, 97112- Neuromuscular re-education, 97535- Self Care, 40981- Manual therapy, [id number]- Gait training, [id number]- Aquatic Therapy, [phone]- Electrical stimulation (unattended), [id number]- Ionotophoresis 4mg /ml Dexamethasone , Patient/Family education, Balance training, Stair training, Dry Needling, and Scar mobilization  PLAN FOR NEXT SESSION: posture, gentle functional strengthening, gait training, manual techniques, modalities PRN.   Janine Melbourne, PT, DPT  Physical Therapist - [facility]  06/19/23, 9:54 AM

## 2023-06-25 ENCOUNTER — Encounter: Payer: Self-pay | Admitting: Neurosurgery

## 2023-06-25 ENCOUNTER — Ambulatory Visit: Payer: Medicare (Managed Care)

## 2023-06-28 ENCOUNTER — Ambulatory Visit: Payer: Medicare (Managed Care)

## 2023-07-01 ENCOUNTER — Ambulatory Visit: Payer: Medicare (Managed Care)

## 2023-07-03 ENCOUNTER — Ambulatory Visit: Payer: Medicare (Managed Care) | Attending: Physician Assistant

## 2023-07-03 ENCOUNTER — Telehealth: Payer: Self-pay

## 2023-07-03 NOTE — Telephone Encounter (Signed)
 No show. Called patient phone number on file. Unable to leave a message secondary to mailbox being full.

## 2023-07-08 ENCOUNTER — Ambulatory Visit: Payer: Medicare (Managed Care)

## 2023-07-10 ENCOUNTER — Ambulatory Visit: Payer: Medicare (Managed Care)

## 2023-07-12 ENCOUNTER — Other Ambulatory Visit: Payer: Self-pay | Admitting: Orthopedic Surgery

## 2023-07-12 DIAGNOSIS — Z981 Arthrodesis status: Secondary | ICD-10-CM

## 2023-07-12 DIAGNOSIS — G959 Disease of spinal cord, unspecified: Secondary | ICD-10-CM

## 2023-07-12 NOTE — Progress Notes (Unsigned)
   REFERRING PHYSICIAN:  [facility] [address] [place],  [place] [address]  DOS: 04/30/23 ACDF C6-C7, C2-C4 laminectomies, C2-T2 PSF  HISTORY OF PRESENT ILLNESS:  Last seen by Dr. Felipe Horton on 06/12/23 and he was feeling better. Numbness/tingling in arms was improving along with his gait and arm pain.   He could wear his collar for comfort only.   His neck pain is improved. He still has some numbness/tingling in his hands, but it is getting better.   His PT moved locations and he would like to follow him [facility]).   He would like to restart PT for lumbar spine as well- [facility] had ordered this previously.    PHYSICAL EXAMINATION:  NEUROLOGICAL:  General: In no acute distress.   Awake, alert, oriented to person, place, and time.  Pupils equal round and reactive to light.  Facial tone is symmetric.    Strength: Side Biceps Triceps Deltoid Interossei Grip Wrist Ext. Wrist Flex.  R 4+ 4+ 5 5 5 5 5   L 5 5 5 5 5 5 5    Side Iliopsoas Quads Hamstring PF DF EHL  R 5 5 5 5 5 5   L 5 5 5 5 5 5     Incisions well healed.   He is ambulating with a cane.   Imaging:  Cervical xrays dated 07/17/23:  No complications noted.   Report for above xrays not yet available.   Assessment / Plan: Caleb Wall is doing well s/p above surgery. Treatment options reviewed with patient and following plan made:   - Message to [facility] regarding PT. Will place orders when I found out his location. He may need to contact [facility] about reordering PT for lumbar spine.  - He can slowly return to activity as tolerated.  - Follow up with Dr. Felipe Horton in 6 months with repeat xrays.   BP was slightly elevated. No symptoms of chest pain, shortness of breath, blurry vision, or headaches. He declined getting blood pressure rechecked. If he develops CP, SOB, blurry vision, or headaches, then he will go to ED.     Advised to contact the office if any questions or concerns arise.   Lucetta Russel  PA-C Dept of Neurosurgery

## 2023-07-16 ENCOUNTER — Ambulatory Visit: Payer: Medicare (Managed Care)

## 2023-07-17 ENCOUNTER — Encounter: Payer: Self-pay | Admitting: Orthopedic Surgery

## 2023-07-17 ENCOUNTER — Ambulatory Visit
Admission: RE | Admit: 2023-07-17 | Discharge: 2023-07-17 | Disposition: A | Discharge status: Home / Self Care | Payer: Medicare (Managed Care) | Attending: Orthopedic Surgery | Admitting: Orthopedic Surgery

## 2023-07-17 ENCOUNTER — Ambulatory Visit
Admission: RE | Admit: 2023-07-17 | Discharge: 2023-07-17 | Disposition: A | Discharge status: Home / Self Care | Payer: Medicare (Managed Care) | Source: Ambulatory Visit | Attending: Orthopedic Surgery | Admitting: Orthopedic Surgery

## 2023-07-17 ENCOUNTER — Ambulatory Visit (INDEPENDENT_AMBULATORY_CARE_PROVIDER_SITE_OTHER): Payer: Medicare (Managed Care) | Admitting: Orthopedic Surgery

## 2023-07-17 VITALS — BP 146/92 | Temp 98.1°F | Ht 67.0 in | Wt 222.0 lb

## 2023-07-17 DIAGNOSIS — Z981 Arthrodesis status: Secondary | ICD-10-CM | POA: Insufficient documentation

## 2023-07-17 DIAGNOSIS — G959 Disease of spinal cord, unspecified: Secondary | ICD-10-CM | POA: Diagnosis present

## 2023-07-18 ENCOUNTER — Ambulatory Visit: Payer: Medicare (Managed Care)

## 2023-07-31 ENCOUNTER — Ambulatory Visit: Payer: Self-pay | Admitting: Physician Assistant

## 2023-08-05 ENCOUNTER — Telehealth: Payer: Self-pay | Admitting: Orthopedic Surgery

## 2023-08-05 DIAGNOSIS — Z981 Arthrodesis status: Secondary | ICD-10-CM

## 2023-08-05 DIAGNOSIS — G959 Disease of spinal cord, unspecified: Secondary | ICD-10-CM

## 2023-08-05 NOTE — Addendum Note (Signed)
 Addended byLucetta Russel on: 08/05/2023 01:42 PM   Modules accepted: Orders

## 2023-08-05 NOTE — Telephone Encounter (Signed)
 He did not have any right leg weakness at last visit. Please schedule him for f/u with PA.    I have sent PT orders for his cervical spine. They should call him. He can call [phone] if he does not hear from them.

## 2023-08-05 NOTE — Telephone Encounter (Signed)
 Please let him know I finally heard back from PT.   [facility] PT is at same location.   [address] [place], [place] [address].  Please ask him if he wants me to put in orders for PT for his neck. He would need [facility] to add orders for lumbar spine if needed.   Thanks!

## 2023-08-07 NOTE — Progress Notes (Signed)
   Telephone Visit- Progress Note: Referring Physician:  [facility] [address] [place],  [place] [address]  Primary Physician:  [facility]  This visit was performed via telephone.  Patient location: home Provider location: office  I spent a total of 10 minutes non-face-to-face activities for this visit on the date of this encounter including review of current clinical condition and response to treatment.    Patient has given verbal consent to this telephone visits and we reviewed the limitations of a telephone visit. Patient wishes to proceed.    Chief Complaint:  follow up  DOS: 04/30/23 ACDF C6-C7, C2-C4 laminectomies, C2-T2 PSF  HISTORY OF PRESENT ILLNESS:  Last seen by me on 07/17/23 and he was feeling better. We discussed restarting PT for his cervical spine.   He called on 08/05/23 and mentioned he'd been having right weakness for about a month. He had no right weakness at last visit.  He changed to phone visit as he is not feeling well. He's had a GI bug since Monday.   He is having increased neck pain. He also feels weakness in both legs, right > left. He's fallen a few times this week and has struggled to get back up. He has been taking ultram  for pain and notes he's been having issues voiding. This has happened to him in the past with ultram .   Exam: No exam done as this was a telephone encounter.     Imaging: none  I have personally reviewed the images and agree with the above interpretation.  Assessment and Plan: Caleb Wall  is having increased neck pain. He also feels weakness in both legs, right > left. He's fallen a few times this week and has struggled to get back up.   He's been sick with GI bug since Monday and is not feeling well at all.   Treatment options reviewed with patient and following plan made:   - Stop ultram  to see if urinary issues improve. If he cannot void at all then he will go to ED.  - Discussed that we  do not prescribe pain medication this far out from surgery. Ultram  was prescribed by [facility] at [facility]. Message to [facility] to contact him regarding other options.  - Follow up next week as scheduled for a good exam and recheck.   Advised to contact the office if any questions or concerns arise.   Lucetta Russel PA-C Dept of Neurosurgery

## 2023-08-08 ENCOUNTER — Ambulatory Visit (INDEPENDENT_AMBULATORY_CARE_PROVIDER_SITE_OTHER): Payer: Medicare (Managed Care) | Admitting: Orthopedic Surgery

## 2023-08-08 ENCOUNTER — Encounter: Payer: Self-pay | Admitting: Orthopedic Surgery

## 2023-08-08 DIAGNOSIS — Z981 Arthrodesis status: Secondary | ICD-10-CM | POA: Diagnosis not present

## 2023-08-08 DIAGNOSIS — G959 Disease of spinal cord, unspecified: Secondary | ICD-10-CM

## 2023-08-12 ENCOUNTER — Ambulatory Visit: Payer: Medicare (Managed Care) | Admitting: Physician Assistant

## 2023-08-13 ENCOUNTER — Ambulatory Visit: Payer: Medicare (Managed Care) | Admitting: Physician Assistant

## 2023-08-14 ENCOUNTER — Emergency Department
Admission: EM | Admit: 2023-08-14 | Discharge: 2023-08-14 | Disposition: A | Discharge status: Home / Self Care | Payer: Medicare (Managed Care) | Attending: Emergency Medicine | Admitting: Emergency Medicine

## 2023-08-14 ENCOUNTER — Other Ambulatory Visit: Payer: Self-pay

## 2023-08-14 DIAGNOSIS — R339 Retention of urine, unspecified: Secondary | ICD-10-CM | POA: Insufficient documentation

## 2023-08-14 DIAGNOSIS — Z96642 Presence of left artificial hip joint: Secondary | ICD-10-CM | POA: Insufficient documentation

## 2023-08-14 DIAGNOSIS — E119 Type 2 diabetes mellitus without complications: Secondary | ICD-10-CM | POA: Insufficient documentation

## 2023-08-14 DIAGNOSIS — N3000 Acute cystitis without hematuria: Secondary | ICD-10-CM

## 2023-08-14 DIAGNOSIS — Z87442 Personal history of urinary calculi: Secondary | ICD-10-CM | POA: Insufficient documentation

## 2023-08-14 DIAGNOSIS — R1030 Lower abdominal pain, unspecified: Secondary | ICD-10-CM | POA: Diagnosis present

## 2023-08-14 DIAGNOSIS — I10 Essential (primary) hypertension: Secondary | ICD-10-CM | POA: Diagnosis not present

## 2023-08-14 DIAGNOSIS — R338 Other retention of urine: Secondary | ICD-10-CM

## 2023-08-14 LAB — CBC WITH DIFFERENTIAL/PLATELET
Abs Immature Granulocytes: 0.04 10*3/uL (ref 0.00–0.07)
Basophils Absolute: 0 10*3/uL (ref 0.0–0.1)
Basophils Relative: 0 %
Eosinophils Absolute: 0.1 10*3/uL (ref 0.0–0.5)
Eosinophils Relative: 1 %
HCT: 37.5 % — ABNORMAL LOW (ref 39.0–52.0)
Hemoglobin: 12.6 g/dL — ABNORMAL LOW (ref 13.0–17.0)
Immature Granulocytes: 0 %
Lymphocytes Relative: 18 %
Lymphs Abs: 2.2 10*3/uL (ref 0.7–4.0)
MCH: 27.5 pg (ref 26.0–34.0)
MCHC: 33.6 g/dL (ref 30.0–36.0)
MCV: 81.7 fL (ref 80.0–100.0)
Monocytes Absolute: 1.4 10*3/uL — ABNORMAL HIGH (ref 0.1–1.0)
Monocytes Relative: 12 %
Neutro Abs: 8.2 10*3/uL — ABNORMAL HIGH (ref 1.7–7.7)
Neutrophils Relative %: 69 %
Platelets: 285 10*3/uL (ref 150–400)
RBC: 4.59 MIL/uL (ref 4.22–5.81)
RDW: 15.3 % (ref 11.5–15.5)
WBC: 12 10*3/uL — ABNORMAL HIGH (ref 4.0–10.5)
nRBC: 0 % (ref 0.0–0.2)

## 2023-08-14 LAB — BASIC METABOLIC PANEL WITH GFR
Anion gap: 17 — ABNORMAL HIGH (ref 5–15)
BUN: 16 mg/dL (ref 8–23)
CO2: 20 mmol/L — ABNORMAL LOW (ref 22–32)
Calcium: 9.5 mg/dL (ref 8.9–10.3)
Chloride: 96 mmol/L — ABNORMAL LOW (ref 98–111)
Creatinine, Ser: 0.99 mg/dL (ref 0.61–1.24)
GFR, Estimated: >60 mL/min (ref >60)
Glucose, Bld: 140 mg/dL — ABNORMAL HIGH (ref 70–99)
Potassium: 2.9 mmol/L — ABNORMAL LOW (ref 3.5–5.1)
Sodium: 133 mmol/L — ABNORMAL LOW (ref 135–145)

## 2023-08-14 LAB — URINALYSIS, W/ REFLEX TO CULTURE (INFECTION SUSPECTED)
Bilirubin Urine: NEGATIVE
Glucose, UA: NEGATIVE mg/dL
Ketones, ur: NEGATIVE mg/dL
Nitrite: NEGATIVE
Protein, ur: NEGATIVE mg/dL
Specific Gravity, Urine: 1.005 (ref 1.005–1.030)
pH: 6 (ref 5.0–8.0)

## 2023-08-14 MED ORDER — POTASSIUM CHLORIDE CRYS ER 20 MEQ PO TBCR: 20.0000 meq | EXTENDED_RELEASE_TABLET | Freq: Every day | ORAL | 0 refills | Status: AC

## 2023-08-14 MED ORDER — CIPROFLOXACIN HCL 500 MG PO TABS: 500.0000 mg | ORAL_TABLET | Freq: Two times a day (BID) | ORAL | 0 refills | Status: AC

## 2023-08-14 MED ORDER — POTASSIUM CHLORIDE CRYS ER 20 MEQ PO TBCR
60.0000 meq | EXTENDED_RELEASE_TABLET | Freq: Once | ORAL | Status: AC
  Administered 2023-08-14: 60 meq via ORAL
  Filled 2023-08-14: qty 3

## 2023-08-14 NOTE — ED Notes (Signed)
Bladder scan 762mL

## 2023-08-14 NOTE — ED Provider Notes (Signed)
 [facility] Provider Note    Event Date/Time   First MD Initiated Contact with Patient 08/14/23 [phone]     (approximate)   History   Chief Complaint: Dysuria   HPI  Caleb Wall is a 68 y.o. male with a history of diabetes, hypertension, BPH who comes ED complaining of inability to urinate for the past 24 hours.  No fever.  Associated with severe lower abdominal pain.        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    Other secondary kyphosis, cervical region    Prediabetes    Primary open angle glaucoma (POAG) of left eye    Pseudoarthrosis of cervical spine (HCC)    Sepsis secondary to UTI [facility])    Spondylosis of cervical spine     Current Outpatient Rx   Order #: [id number] Class: OTC   Order #: [id number] Class: Normal   Order #: [id number] Class: Historical Med   Order #: [id number] Class: No Print   Order #: [id number] Class: Historical Med   Order #: [id number] Class: Historical Med   Order #: [id number] Class: Historical Med   Order #: [id number] Class: Normal   Order #: [id number] Class: Normal   Order #: [id number] Class: Normal   Order #: [id number] Class: Normal    Past Surgical History:  Procedure Laterality Date   ANTERIOR CERVICAL DECOMP/DISCECTOMY FUSION N/A 04/30/2023   Procedure: C6-7 ANTERIOR CERVICAL DISCECTOMY AND FUSION;  Surgeon: Claudene Penne ORN, MD;  Location: [facility] ORS;  Service: Neurosurgery;  Laterality: N/A;   ANTERIOR CERVICAL DECOMPRESSION/DISCECTOMY FUSION 4 LEVELS N/A 12/26/2020   Procedure: C3-6 ANTERIOR CERVICAL DECOMPRESSION/DISCECTOMY FUSION 3 LEVELS;  Surgeon: Bluford Standing, MD;  Location: [facility] ORS;  Service: Neurosurgery;   Laterality: N/A;   BROW LIFT AND BLEPHAROPLASTY Bilateral 12/21/2019   CATARACT EXTRACTION W/ INTRAOCULAR LENS IMPLANT Left 02/05/2019   COLONOSCOPY WITH PROPOFOL  N/A 09/29/2021   Procedure: COLONOSCOPY WITH PROPOFOL ;  Surgeon: Maryruth Ole DASEN, MD;  Location: [facility] ENDOSCOPY;  Service: Endoscopy;  Laterality: N/A;   KNEE SURGERY Left 2002   Meniscus tear   LITHOTRIPSY  12/13/2020   MASS EXCISION N/A 10/11/2022   Procedure: EXCISION MASS CHEST WALL;  Surgeon: Tye Millet, DO;  Location: [facility] ORS;  Service: General;  Laterality: N/A;   ORIF FEMORAL NECK FRACTURE W/ DHS Left 03/06/2020   POSTERIOR CERVICAL FUSION/FORAMINOTOMY N/A 04/30/2023   Procedure: C2-T2 POSTERIOR SPINAL FUSION;  Surgeon: Claudene Penne ORN, MD;  Location: [facility] ORS;  Service: Neurosurgery;  Laterality: N/A;   POSTERIOR CERVICAL LAMINECTOMY N/A 04/30/2023   Procedure: C2-C4 LAMINECTOMY;  Surgeon: Claudene Penne ORN, MD;  Location: [facility] ORS;  Service: Neurosurgery;  Laterality: N/A;   SHOULDER ARTHROSCOPY WITH OPEN ROTATOR CUFF REPAIR AND DISTAL CLAVICLE ACROMINECTOMY Left 07/25/2021   Procedure: SHOULDER ARTHROSCOPY WITH OPEN ROTATOR CUFF REPAIR AND DISTAL CLAVICLE ACROMINECTOMY;  Surgeon: Marchia Drivers, MD;  Location: [facility] ORS;  Service: Orthopedics;  Laterality: Left;   TOTAL HIP ARTHROPLASTY Left 03/06/2020    Physical Exam   Triage Vital Signs: ED Triage Vitals [08/14/23 0444]  Encounter Vitals Group     BP (!) 189/95     Girls Systolic BP  Percentile      Girls Diastolic BP Percentile      Boys Systolic BP Percentile      Boys Diastolic BP Percentile      Pulse Rate 91     Resp 20     Temp 99.2 F (37.3 C)     Temp Source Oral     SpO2 98 %     Weight      Height      Head Circumference      Peak Flow      Pain Score 9     Pain Loc      Pain Education      Exclude from Growth Chart     Most recent vital signs: Vitals:   08/14/23 0600 08/14/23 0700  BP: (!) 169/92 (!) 158/100  Pulse: 74 89   Resp:  18  Temp:    SpO2: 95% 98%    General: Awake, no distress.  CV:  Good peripheral perfusion.  Resp:  Normal effort.  Abd:  Soft with suprapubic tenderness, palpable bladder    ED Results / Procedures / Treatments   Labs (all labs ordered are listed, but only abnormal results are displayed) Labs Reviewed  BASIC METABOLIC PANEL WITH GFR - Abnormal; Notable for the following components:      Result Value   Sodium 133 (*)    Potassium 2.9 (*)    Chloride 96 (*)    CO2 20 (*)    Glucose, Bld 140 (*)    Anion gap 17 (*)    All other components within normal limits  CBC WITH DIFFERENTIAL/PLATELET - Abnormal; Notable for the following components:   WBC 12.0 (*)    Hemoglobin 12.6 (*)    HCT 37.5 (*)    Neutro Abs 8.2 (*)    Monocytes Absolute 1.4 (*)    All other components within normal limits  URINALYSIS, W/ REFLEX TO CULTURE (INFECTION SUSPECTED)     EKG    RADIOLOGY    PROCEDURES:  Procedures   MEDICATIONS ORDERED IN ED: Medications  potassium chloride SA (KLOR-CON M) CR tablet 60 mEq (60 mEq Oral Given 08/14/23 9344)     IMPRESSION / MDM / ASSESSMENT AND PLAN / ED COURSE  I reviewed the triage vital signs and the nursing notes.  DDx: Urinary retention, AKI, electrolyte derangement, cystitis  Patient's presentation is most consistent with acute presentation with potential threat to life or bodily function.  Patient presents with symptoms of urinary retention with known BPH.  Bladder scan significantly elevated.  Foley catheter inserted with immediate outflow of at least 1 L urine.  Will follow-up serum labs.  ----------------------------------------- 7:13 AM on 08/14/2023 ----------------------------------------- Serum labs unremarkable.  Awaiting urinalysis, can discharge with/without antibiotics as indicated.  Patient reports he sees Dr. Twylla.       FINAL CLINICAL IMPRESSION(S) / ED DIAGNOSES   Final diagnoses:  Acute urinary  retention     Rx / DC Orders   ED Discharge Orders     None        Note:  This document was prepared using Dragon voice recognition software and may include unintentional dictation errors.   Viviann Pastor, MD 08/14/23 [phone]

## 2023-08-14 NOTE — ED Notes (Signed)
 Called lab at this time to add on UA that was sent prior to shift start.

## 2023-08-14 NOTE — Discharge Instructions (Addendum)
 Take your antibiotics as directed. Follow up with urology in 1 to 2 weeks for reevaluation and a void trial.  I have also sent a couple days of additional potassium to your pharmacy.  Return to the ER for new or worsening symptoms.

## 2023-08-14 NOTE — ED Provider Notes (Signed)
 Care of this patient assumed from prior physician at 0700 pending urinalysis, anticipated discharge. Please see prior physician note for further details.  Briefly, this is a 68 year old male with history of BPH who presented with urinary retention and lower abdominal pain.  Had Foley catheter placed here with greater than 1 L of urine returned.  Labs with minimal leukocytosis, BMP notable for hypokalemia for pH patient received oral repletion.  Signed out to me pending urinalysis and disposition.  Urinalysis did demonstrate concerns for infection with many bacteria, large leukocyte Estrace, 21-50 white blood cells on a clean sample.  Has seen urology in the past.  Reviewed recent urine cultures.  In June 2024, patient grew sensitive E faecalis.  In May 2024 grew E. coli resistant to Macrobid .  Reviewed with pharmacy, did recommend ciprofloxacin  as empiric oral antibiotic choice.  Patient reassessed.  Discussed plan was discharged with catheter in place, oral antibiotics, follow-up with urology.  He is comfortable this plan.  Strict return precautions provided.  Patient discharged stable condition.   Levander Slate, MD 08/14/23 [phone]

## 2023-08-14 NOTE — ED Triage Notes (Signed)
 Patient states that he has not been able to urinate since yesterday morning, and his abdomen/bladder is in extreme pain.  He says that last night he was able to get out a few dribbles, but only a few ounces.

## 2023-08-16 ENCOUNTER — Ambulatory Visit: Payer: Medicare (Managed Care) | Admitting: Physician Assistant

## 2023-08-16 DIAGNOSIS — N390 Urinary tract infection, site not specified: Secondary | ICD-10-CM

## 2023-08-16 DIAGNOSIS — N200 Calculus of kidney: Secondary | ICD-10-CM

## 2023-08-16 LAB — URINE CULTURE: Culture: 80000 — AB

## 2023-08-20 ENCOUNTER — Ambulatory Visit (INDEPENDENT_AMBULATORY_CARE_PROVIDER_SITE_OTHER): Payer: Medicare (Managed Care) | Admitting: Physician Assistant

## 2023-08-20 ENCOUNTER — Ambulatory Visit: Payer: Medicare (Managed Care) | Admitting: Physician Assistant

## 2023-08-20 DIAGNOSIS — R338 Other retention of urine: Secondary | ICD-10-CM | POA: Diagnosis not present

## 2023-08-20 DIAGNOSIS — N401 Enlarged prostate with lower urinary tract symptoms: Secondary | ICD-10-CM | POA: Diagnosis not present

## 2023-08-20 LAB — BLADDER SCAN AMB NON-IMAGING

## 2023-08-20 MED ORDER — TADALAFIL 5 MG PO TABS: 5.0000 mg | ORAL_TABLET | Freq: Every day | ORAL | 11 refills | Status: AC

## 2023-08-20 NOTE — Progress Notes (Signed)
 08/20/2023 2:13 PM   Caleb Wall 1956/02/07 [id number]  CC: Chief Complaint  Patient presents with   Recurrent UTI   HPI: Caleb Wall is a 68 y.o. 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 Epifanio, BPH with incomplete bladder emptying on tadalafil  2.5 mg  and alfuzosin  10 mg daily who was unable to tolerate dutasteride  due to fatigue, and nephrolithiasis who developed acute urinary retention requiring Foley catheter placement in the ED in the setting of UTI on 08/14/2023 who presents today for voiding trial.   Bladder scan on arrival in the ED was 723 mL.  Urine culture grew ampicillin resistant E. coli and he was discharged on culture appropriate Cipro  500 mg twice daily x 7 days.  Foley catheter removed in the morning, see separate note.  He returned in the afternoon for PVR.  He has voided multiple times. PVR .  Notably, he mentions he was on pain medication at the time of his urinary retention onset with Tramadol  and oxycodone . He notices worsening voiding symptoms on these meds. He is not taking them currently.  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    Other secondary kyphosis, cervical region    Prediabetes    Primary open angle glaucoma (POAG) of left eye    Pseudoarthrosis of cervical spine (HCC)    Sepsis secondary to UTI (HCC)    Spondylosis of cervical spine     Surgical History: Past Surgical History:  Procedure Laterality Date   ANTERIOR CERVICAL DECOMP/DISCECTOMY FUSION N/A 04/30/2023   Procedure: C6-7 ANTERIOR CERVICAL DISCECTOMY AND FUSION;  Surgeon: Claudene Penne ORN, MD;  Location: [facility];  Service: Neurosurgery;  Laterality:  N/A;   ANTERIOR CERVICAL DECOMPRESSION/DISCECTOMY FUSION 4 LEVELS N/A 12/26/2020   Procedure: C3-6 ANTERIOR CERVICAL DECOMPRESSION/DISCECTOMY FUSION 3 LEVELS;  Surgeon: Bluford Standing, MD;  Location: [facility];  Service: Neurosurgery;  Laterality: N/A;   BROW LIFT AND BLEPHAROPLASTY Bilateral 12/21/2019   CATARACT EXTRACTION W/ INTRAOCULAR LENS IMPLANT Left 02/05/2019   COLONOSCOPY WITH PROPOFOL  N/A 09/29/2021   Procedure: COLONOSCOPY WITH PROPOFOL ;  Surgeon: Maryruth Ole DASEN, MD;  Location: [facility];  Service: Endoscopy;  Laterality: N/A;   KNEE SURGERY Left 2002   Meniscus tear   LITHOTRIPSY  12/13/2020   MASS EXCISION N/A 10/11/2022   Procedure: EXCISION MASS CHEST WALL;  Surgeon: Tye Millet, DO;  Location: [facility];  Service: General;  Laterality: N/A;   ORIF FEMORAL NECK FRACTURE W/ DHS Left 03/06/2020   POSTERIOR CERVICAL FUSION/FORAMINOTOMY N/A 04/30/2023   Procedure: C2-T2 POSTERIOR SPINAL FUSION;  Surgeon: Claudene Penne ORN, MD;  Location: [facility];  Service: Neurosurgery;  Laterality: N/A;   POSTERIOR CERVICAL LAMINECTOMY N/A 04/30/2023   Procedure: C2-C4 LAMINECTOMY;  Surgeon: Claudene Penne ORN, MD;  Location: [facility];  Service: Neurosurgery;  Laterality: N/A;   SHOULDER ARTHROSCOPY WITH OPEN ROTATOR CUFF REPAIR AND DISTAL CLAVICLE ACROMINECTOMY Left 07/25/2021   Procedure: SHOULDER ARTHROSCOPY WITH OPEN ROTATOR CUFF REPAIR AND DISTAL CLAVICLE ACROMINECTOMY;  Surgeon: Marchia Drivers, MD;  Location: [facility];  Service: Orthopedics;  Laterality: Left;   TOTAL HIP ARTHROPLASTY Left  03/06/2020    Home Medications:  Allergies as of 08/20/2023   No Known Allergies      Medication List        Accurate as of August 20, 2023  2:13 PM. If you have any questions, ask your nurse or doctor.          acetaminophen  325 MG tablet Commonly known as: TYLENOL  Take 2 tablets (650 mg total) by mouth every 4 (four) hours as needed for mild pain (pain score 1-3) (or temp > 100.5).    alfuzosin  10 MG 24 hr tablet Commonly known as: UROXATRAL  Take 1 tablet (10 mg total) by mouth daily.   brimonidine  0.2 % ophthalmic solution Commonly known as: ALPHAGAN  Place 1 drop into both eyes 2 (two) times daily.   ciprofloxacin  500 MG tablet Commonly known as: Cipro  Take 1 tablet (500 mg total) by mouth 2 (two) times daily for 7 days.   docusate sodium  100 MG capsule Commonly known as: COLACE Take 1 capsule (100 mg total) by mouth 2 (two) times daily.   dorzolamide -timolol  2-0.5 % ophthalmic solution Commonly known as: COSOPT  Place 1 drop into both eyes 2 (two) times daily.   latanoprost  0.005 % ophthalmic solution Commonly known as: XALATAN  Place 1 drop into both eyes at bedtime.   Mounjaro  10 MG/0.5ML Pen Generic drug: tirzepatide  Inject 10 mg into the skin once a week.   nitrofurantoin  (macrocrystal-monohydrate) 100 MG capsule Commonly known as: MACROBID  Take 1 capsule (100 mg total) by mouth daily.   pantoprazole  40 MG tablet Commonly known as: PROTONIX  Take 1 tablet (40 mg total) by mouth daily.   polyethylene glycol powder 17 GM/SCOOP powder Commonly known as: GLYCOLAX /MIRALAX  Take 17 g by mouth daily.   potassium chloride  SA 20 MEQ tablet Commonly known as: KLOR-CON  M Take 1 tablet (20 mEq total) by mouth daily for 2 days.   tadalafil  5 MG tablet Commonly known as: CIALIS  Take 0.5 tablets (2.5 mg total) by mouth 3 (three) times a week. What changed:  how much to take when to take this additional instructions        Allergies:  No Known Allergies  Family History: Family History  Problem Relation Age of Onset   Heart disease Mother    Hypertension Mother    Diabetes Maternal Aunt    Cancer Maternal Uncle        Prostate   Diabetes Paternal Aunt     Social History:   reports that he quit smoking about 4 years ago. His smoking use included cigarettes. He started smoking about 44 years ago. He has a 20 pack-year smoking history. He has  never used smokeless tobacco. He reports that he does not currently use drugs after having used the following drugs: Marijuana. He reports that he does not drink alcohol.  Physical Exam: There were no vitals taken for this visit.  Constitutional:  Alert and oriented, no acute distress, nontoxic appearing HEENT: , AT Cardiovascular: No clubbing, cyanosis, or edema Respiratory: Normal respiratory effort, no increased work of breathing Skin: No rashes, bruises or suspicious lesions Neurologic: Grossly intact, no focal deficits, moving all 4 extremities Psychiatric: Normal mood and affect  Laboratory Data: Results for orders placed or performed in visit on 08/16/23  BLADDER SCAN AMB NON-IMAGING   Collection Time: 08/20/23  2:22 PM  Result Value Ref Range   Scan Result    Assessment & Plan:   1. Benign prostatic hyperplasia with incomplete bladder emptying (Primary) Voiding trial equivocal  today. I offered him Foley replacement but he declined. Continue alfuzosin  10mg  daily and will increase tadalafil  to 5mg  daily.  I recommended he consider outlet procedures at this time given his recurrent retention, incomplete emptying, and recurrent UTIs. He is open to this. He had a cysto with Dr. Twylla a year ago; will discuss with him regarding procedure recommendations and contact the patient to discuss further. - tadalafil  (CIALIS ) 5 MG tablet; Take 1 tablet (5 mg total) by mouth daily. Patient takes at bedtime Sunday, Wednesday, Friday  Dispense: 30 tablet; Refill: 11   Return for Will call with outlet procedure recommendations.  Lucie Hones, PA-C  [facility] [place] [address], [address] [place], [place] [address] [phone]

## 2023-08-20 NOTE — Progress Notes (Signed)
 Catheter Removal  Patient is present today for a catheter removal.  8ml of water  was drained from the balloon. A 16FR foley cath was removed from the bladder, no complications were noted. Patient tolerated well.  Performed by: Powell Sink CMA

## 2023-08-23 NOTE — Progress Notes (Deleted)
   REFERRING PHYSICIAN:  [facility] [address] [place],  [place] [address]  DOS: 04/30/23 ACDF C6-C7, C2-C4 laminectomies, C2-T2 PSF  HISTORY OF PRESENT ILLNESS:  He had phone visit with me on 08/08/23- he was having increased neck pain with weakness in both legs, right > left. He was sick with GI bug during this time as well.   Seen in ED on 08/14/23 for acute urinary retention and UTI. Was discharged on cipro  and with a catheter. He was to follow up with urology.   He is here for follow up of leg weakness.         Last seen by Dr. Claudene on 06/12/23 and he was feeling better. Numbness/tingling in arms was improving along with his gait and arm pain.   He could wear his collar for comfort only.   His neck pain is improved. He still has some numbness/tingling in his hands, but it is getting better.   His PT moved locations and he would like to follow him [facility]).   He would like to restart PT for lumbar spine as well- [facility] had ordered this previously.    PHYSICAL EXAMINATION:  NEUROLOGICAL:  General: In no acute distress.   Awake, alert, oriented to person, place, and time.  Pupils equal round and reactive to light.  Facial tone is symmetric.    Strength: Side Biceps Triceps Deltoid Interossei Grip Wrist Ext. Wrist Flex.  R 4+ 4+ 5 5 5 5 5   L 5 5 5 5 5 5 5    Side Iliopsoas Quads Hamstring PF DF EHL  R 5 5 5 5 5 5   L 5 5 5 5 5 5     Incisions well healed.   He is ambulating with a cane.   Imaging:  Cervical xrays dated 07/17/23:  No complications noted.   Report for above xrays not yet available.   Assessment / Plan: Caleb Wall is doing well s/p above surgery. Treatment options reviewed with patient and following plan made:   - Message to [facility] regarding PT. Will place orders when I found out his location. He may need to contact [facility] about reordering PT for lumbar spine.  - He can slowly return to activity as tolerated.  -  Follow up with Dr. Claudene in 6 months with repeat xrays.   BP was slightly elevated. No symptoms of chest pain, shortness of breath, blurry vision, or headaches. He declined getting blood pressure rechecked. If he develops CP, SOB, blurry vision, or headaches, then he will go to ED.     Advised to contact the office if any questions or concerns arise.   Glade Boys PA-C Dept of Neurosurgery

## 2023-08-26 ENCOUNTER — Telehealth: Payer: Self-pay | Admitting: Physician Assistant

## 2023-08-26 NOTE — Telephone Encounter (Signed)
 I spoke with Dr. Twylla, and he recommends an office visit to discuss procedural options for his enlarged prostate. Please schedule with IPSS, PVR, soonest available.

## 2023-08-30 NOTE — Progress Notes (Signed)
 error

## 2023-09-03 ENCOUNTER — Ambulatory Visit: Payer: Medicare (Managed Care) | Admitting: Orthopedic Surgery

## 2023-09-03 NOTE — Telephone Encounter (Signed)
 Spoke with patient today 09/03/23. He is going to detox this afternoon. Not sure if it is inpatient or outpatient.   He will make f/u with me when he is ready.

## 2023-09-25 ENCOUNTER — Ambulatory Visit: Payer: Medicare (Managed Care) | Admitting: Physician Assistant

## 2023-09-26 ENCOUNTER — Ambulatory Visit (INDEPENDENT_AMBULATORY_CARE_PROVIDER_SITE_OTHER): Payer: Medicare (Managed Care) | Admitting: Physician Assistant

## 2023-09-26 VITALS — BP 159/90 | HR 63 | Ht 67.0 in | Wt 190.3 lb

## 2023-09-26 DIAGNOSIS — Z87440 Personal history of urinary (tract) infections: Secondary | ICD-10-CM

## 2023-09-26 DIAGNOSIS — N390 Urinary tract infection, site not specified: Secondary | ICD-10-CM

## 2023-09-26 DIAGNOSIS — R338 Other retention of urine: Secondary | ICD-10-CM | POA: Diagnosis not present

## 2023-09-26 DIAGNOSIS — N401 Enlarged prostate with lower urinary tract symptoms: Secondary | ICD-10-CM | POA: Diagnosis not present

## 2023-09-26 LAB — MICROSCOPIC EXAMINATION
RBC, Urine: >30 /HPF — AB (ref 0–2)
WBC, UA: >30 /HPF — AB (ref 0–5)

## 2023-09-26 LAB — URINALYSIS, COMPLETE
Bilirubin, UA: NEGATIVE
Glucose, UA: NEGATIVE
Nitrite, UA: POSITIVE — AB
Specific Gravity, UA: 1.015 (ref 1.005–1.030)
Urobilinogen, Ur: 1 mg/dL (ref 0.2–1.0)
pH, UA: 7.5 (ref 5.0–7.5)

## 2023-09-26 LAB — BLADDER SCAN AMB NON-IMAGING

## 2023-09-26 MED ORDER — CIPROFLOXACIN HCL 500 MG PO TABS: 500.0000 mg | ORAL_TABLET | Freq: Two times a day (BID) | ORAL | 0 refills | Status: AC

## 2023-09-26 MED ORDER — NITROFURANTOIN MONOHYD MACRO 100 MG PO CAPS: 100.0000 mg | ORAL_CAPSULE | Freq: Every day | ORAL | 2 refills | Status: AC

## 2023-09-26 MED ORDER — ALFUZOSIN HCL ER 10 MG PO TB24: 10.0000 mg | ORAL_TABLET | Freq: Every day | ORAL | 0 refills | Status: DC

## 2023-09-26 NOTE — Progress Notes (Signed)
 09/26/2023 10:16 AM   Caleb Wall Dec 12, 1955 [id number]  CC: Chief Complaint  Patient presents with   Urinary Frequency   HPI: Caleb Wall is a 68 y.o. 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 Epifanio, BPH with incomplete bladder emptying on tadalafil  2.5 mg and alfuzosin  10 mg daily, and nephrolithiasis who presents today for evaluation of possible UTI.   Today he reports he ran out of suppressive Macrobid  a month ago.  Not long after, he developed frequent and urgent urination, weakness, low back pain, and malodorous urine.  He is very fatigued today due to nocturia and sleep interruption, but denies fevers.  He also lost his alfuzosin  last week and requests a refill.  Notably, he is scheduled to see Dr. Twylla next month to discuss outlet procedures.  In-office UA today positive for trace protein, 3+ blood, nitrites, trace ketones, and 2+ leukocytes; urine microscopy with >30 WBCs/HPF, >30 RBCs/HPF, and many bacteria. PVR , previously 438 mL.  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    Other secondary kyphosis, cervical region    Prediabetes    Primary open angle glaucoma (POAG) of left eye    Pseudoarthrosis of cervical spine (HCC)    Sepsis secondary to UTI (HCC)    Spondylosis of cervical spine     Surgical History: Past Surgical History:  Procedure Laterality Date   ANTERIOR CERVICAL DECOMP/DISCECTOMY FUSION N/A 04/30/2023   Procedure: C6-7 ANTERIOR CERVICAL DISCECTOMY AND FUSION;  Surgeon: Claudene Penne ORN, MD;  Location: [facility];  Service: Neurosurgery;  Laterality: N/A;   ANTERIOR CERVICAL DECOMPRESSION/DISCECTOMY FUSION 4 LEVELS N/A  12/26/2020   Procedure: C3-6 ANTERIOR CERVICAL DECOMPRESSION/DISCECTOMY FUSION 3 LEVELS;  Surgeon: Bluford Standing, MD;  Location: [facility];  Service: Neurosurgery;  Laterality: N/A;   BROW LIFT AND BLEPHAROPLASTY Bilateral 12/21/2019   CATARACT EXTRACTION W/ INTRAOCULAR LENS IMPLANT Left 02/05/2019   COLONOSCOPY WITH PROPOFOL  N/A 09/29/2021   Procedure: COLONOSCOPY WITH PROPOFOL ;  Surgeon: Maryruth Ole DASEN, MD;  Location: [facility];  Service: Endoscopy;  Laterality: N/A;   KNEE SURGERY Left 2002   Meniscus tear   LITHOTRIPSY  12/13/2020   MASS EXCISION N/A 10/11/2022   Procedure: EXCISION MASS CHEST WALL;  Surgeon: Tye Millet, DO;  Location: [facility];  Service: General;  Laterality: N/A;   ORIF FEMORAL NECK FRACTURE W/ DHS Left 03/06/2020   POSTERIOR CERVICAL FUSION/FORAMINOTOMY N/A 04/30/2023   Procedure: C2-T2 POSTERIOR SPINAL FUSION;  Surgeon: Claudene Penne ORN, MD;  Location: [facility];  Service: Neurosurgery;  Laterality: N/A;   POSTERIOR CERVICAL LAMINECTOMY N/A 04/30/2023   Procedure: C2-C4 LAMINECTOMY;  Surgeon: Claudene Penne ORN, MD;  Location: [facility];  Service: Neurosurgery;  Laterality: N/A;   SHOULDER ARTHROSCOPY WITH OPEN ROTATOR CUFF REPAIR AND DISTAL CLAVICLE ACROMINECTOMY Left 07/25/2021   Procedure: SHOULDER ARTHROSCOPY WITH OPEN ROTATOR CUFF REPAIR AND DISTAL CLAVICLE ACROMINECTOMY;  Surgeon: Marchia Drivers, MD;  Location: [facility];  Service: Orthopedics;  Laterality: Left;   TOTAL HIP ARTHROPLASTY Left 03/06/2020    Home Medications:  Allergies as of 09/26/2023   No Known Allergies  Medication List        Accurate as of September 26, 2023 10:16 AM. If you have any questions, ask your nurse or doctor.          acetaminophen  325 MG tablet Commonly known as: TYLENOL  Take 2 tablets (650 mg total) by mouth every 4 (four) hours as needed for mild pain (pain score 1-3) (or temp > 100.5).   alfuzosin  10 MG 24 hr tablet Commonly known as: UROXATRAL  Take 1  tablet (10 mg total) by mouth daily.   brimonidine  0.2 % ophthalmic solution Commonly known as: ALPHAGAN  Place 1 drop into both eyes 2 (two) times daily.   docusate sodium  100 MG capsule Commonly known as: COLACE Take 1 capsule (100 mg total) by mouth 2 (two) times daily.   dorzolamide -timolol  2-0.5 % ophthalmic solution Commonly known as: COSOPT  Place 1 drop into both eyes 2 (two) times daily.   latanoprost  0.005 % ophthalmic solution Commonly known as: XALATAN  Place 1 drop into both eyes at bedtime.   Mounjaro  10 MG/0.5ML Pen Generic drug: tirzepatide  Inject 10 mg into the skin once a week.   nitrofurantoin  (macrocrystal-monohydrate) 100 MG capsule Commonly known as: MACROBID  Take 1 capsule (100 mg total) by mouth daily.   pantoprazole  40 MG tablet Commonly known as: PROTONIX  Take 1 tablet (40 mg total) by mouth daily.   polyethylene glycol powder 17 GM/SCOOP powder Commonly known as: GLYCOLAX /MIRALAX  Take 17 g by mouth daily.   potassium chloride  SA 20 MEQ tablet Commonly known as: KLOR-CON  M Take 1 tablet (20 mEq total) by mouth daily for 2 days.   tadalafil  5 MG tablet Commonly known as: CIALIS  Take 1 tablet (5 mg total) by mouth daily. Patient takes at bedtime Sunday, Wednesday, Friday        Allergies:  No Known Allergies  Family History: Family History  Problem Relation Age of Onset   Heart disease Mother    Hypertension Mother    Diabetes Maternal Aunt    Cancer Maternal Uncle        Prostate   Diabetes Paternal Aunt     Social History:   reports that he quit smoking about 4 years ago. His smoking use included cigarettes. He started smoking about 44 years ago. He has a 20 pack-year smoking history. He has never used smokeless tobacco. He reports that he does not currently use drugs after having used the following drugs: Marijuana. He reports that he does not drink alcohol.  Physical Exam: BP (!) 159/90   Pulse 63   Ht 5' 7 (1.702 m)   Wt  190 lb 4.8 oz (86.3 kg)   BMI 29.81 kg/m   Constitutional:  Alert and oriented, no acute distress, nontoxic appearing HEENT: [place], AT Cardiovascular: No clubbing, cyanosis, or edema Respiratory: Normal respiratory effort, no increased work of breathing Skin: No rashes, bruises or suspicious lesions Neurologic: Grossly intact, no focal deficits, moving all 4 extremities Psychiatric: Normal mood and affect  Laboratory Data: Results for orders placed or performed in visit on 09/26/23  Microscopic Examination   Collection Time: 09/26/23  9:59 AM   Urine  Result Value Ref Range   WBC, UA >30 (A) 0 - 5 /hpf   RBC, Urine >30 (A) 0 - 2 /hpf   Epithelial Cells (non renal) 0-10 0 - 10 /hpf   Mucus, UA Present (A) Not Estab.   Bacteria, UA Many (A) None seen/Few  Urinalysis, Complete   Collection Time: 09/26/23  9:59 AM  Result  Value Ref Range   Specific Gravity, UA 1.015 1.005 - 1.030   pH, UA 7.5 5.0 - 7.5   Color, UA Yellow Yellow   Appearance Ur Cloudy (A) Clear   Leukocytes,UA 2+ (A) Negative   Protein,UA Trace Negative/Trace   Glucose, UA Negative Negative   Ketones, UA Trace (A) Negative   RBC, UA 3+ (A) Negative   Bilirubin, UA Negative Negative   Urobilinogen, Ur 1.0 0.2 - 1.0 mg/dL   Nitrite, UA Positive (A) Negative   Microscopic Examination See below:   BLADDER SCAN AMB NON-IMAGING   Collection Time: 09/26/23 10:17 AM  Result Value Ref Range   Scan Result    Assessment & Plan:   1. Recurrent UTI (Primary) UA appears grossly infected today, will start empiric Cipro  and send for culture for further evaluation.  Will resume suppressive Macrobid .  Agree with plans to discuss outlet procedures, as I strongly suspect his incomplete emptying is contributing to his recurrent UTIs. - BLADDER SCAN AMB NON-IMAGING - Urinalysis, Complete - CULTURE, URINE COMPREHENSIVE - nitrofurantoin , macrocrystal-monohydrate, (MACROBID ) 100 MG capsule; Take 1 capsule (100 mg total) by  mouth daily.  Dispense: 30 capsule; Refill: 2 - ciprofloxacin  (CIPRO ) 500 MG tablet; Take 1 tablet (500 mg total) by mouth 2 (two) times daily for 7 days.  Dispense: 14 tablet; Refill: 0  2. Benign prostatic hyperplasia with incomplete bladder emptying PVR elevated, around baseline today.  I offered him Foley placement but he declined.  Will resume alfuzosin . - alfuzosin  (UROXATRAL ) 10 MG 24 hr tablet; Take 1 tablet (10 mg total) by mouth daily.  Dispense: 30 tablet; Refill: 0   Return for Keep follow-up as scheduled.  Lucie Hones, PA-C  [facility] [place] [address] [place], [place] [address] [phone]

## 2023-10-03 LAB — CULTURE, URINE COMPREHENSIVE

## 2023-10-09 ENCOUNTER — Ambulatory Visit: Payer: Self-pay | Admitting: Physician Assistant

## 2023-10-23 ENCOUNTER — Other Ambulatory Visit: Payer: Self-pay

## 2023-10-23 ENCOUNTER — Other Ambulatory Visit: Payer: Self-pay | Admitting: Physician Assistant

## 2023-10-23 DIAGNOSIS — N401 Enlarged prostate with lower urinary tract symptoms: Secondary | ICD-10-CM

## 2023-10-29 ENCOUNTER — Ambulatory Visit (INDEPENDENT_AMBULATORY_CARE_PROVIDER_SITE_OTHER): Payer: Medicare (Managed Care) | Admitting: Urology

## 2023-10-29 ENCOUNTER — Encounter: Payer: Self-pay | Admitting: Urology

## 2023-10-29 VITALS — BP 119/75 | HR 87 | Ht 67.0 in | Wt 190.3 lb

## 2023-10-29 DIAGNOSIS — R3914 Feeling of incomplete bladder emptying: Secondary | ICD-10-CM

## 2023-10-29 DIAGNOSIS — N390 Urinary tract infection, site not specified: Secondary | ICD-10-CM

## 2023-10-29 DIAGNOSIS — N401 Enlarged prostate with lower urinary tract symptoms: Secondary | ICD-10-CM

## 2023-10-29 LAB — BLADDER SCAN AMB NON-IMAGING

## 2023-10-29 NOTE — Progress Notes (Signed)
 10/29/2023 3:19 PM   Caleb Wall [birth year] [id number]  Referring provider: [facility] [address] [place],  [place] [address]  Chief Complaint  Patient presents with   Other   Urologic history: 1. Recurrent UTI   2. BPH with incomplete bladder emptying Tamsulosin  0.4 mg   3. Bilateral Nephrolithiasis Non-obstructing bilateral renal calculi.   HPI: Caleb Wall is a 68 y.o. male who presents for follow-up of BPH.  His daughter was with him today  Developed acute urinary retention and seen in the ED 08/14/2023 with Foley catheter placement.  Bladder scan in ED 723 mL.  At time of voiding trial PVR 418 mL and he declined Foley placement.  Seen 09/26/2023 for UTI and PVR at that visit 347 He states he was taking his alfuzosin  and tadalafil  sporadically in the past but since he has been taking it regularly his voiding symptoms have significantly improved. 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    Other secondary kyphosis, cervical region    Prediabetes    Primary open angle glaucoma (POAG) of left eye    Pseudoarthrosis of cervical spine (HCC)    Sepsis secondary to UTI (HCC)    Spondylosis of cervical spine     Surgical History: Past Surgical History:  Procedure Laterality Date   ANTERIOR CERVICAL DECOMP/DISCECTOMY FUSION N/A 04/30/2023   Procedure: C6-7 ANTERIOR CERVICAL DISCECTOMY AND FUSION;  Surgeon: Claudene Penne ORN, MD;  Location: [facility];  Service: Neurosurgery;  Laterality: N/A;   ANTERIOR CERVICAL DECOMPRESSION/DISCECTOMY FUSION 4 LEVELS N/A 12/26/2020   Procedure: C3-6 ANTERIOR CERVICAL  DECOMPRESSION/DISCECTOMY FUSION 3 LEVELS;  Surgeon: Bluford Standing, MD;  Location: [facility];  Service: Neurosurgery;  Laterality: N/A;   BROW LIFT AND BLEPHAROPLASTY Bilateral 12/21/2019   CATARACT EXTRACTION W/ INTRAOCULAR LENS IMPLANT Left 02/05/2019   COLONOSCOPY WITH PROPOFOL  N/A 09/29/2021   Procedure: COLONOSCOPY WITH PROPOFOL ;  Surgeon: Maryruth Ole DASEN, MD;  Location: [facility];  Service: Endoscopy;  Laterality: N/A;   KNEE SURGERY Left 2002   Meniscus tear   LITHOTRIPSY  12/13/2020   MASS EXCISION N/A 10/11/2022   Procedure: EXCISION MASS CHEST WALL;  Surgeon: Tye Millet, DO;  Location: [facility];  Service: General;  Laterality: N/A;   ORIF FEMORAL NECK FRACTURE W/ DHS Left 03/06/2020   POSTERIOR CERVICAL FUSION/FORAMINOTOMY N/A 04/30/2023   Procedure: C2-T2 POSTERIOR SPINAL FUSION;  Surgeon: Claudene Penne ORN, MD;  Location: [facility];  Service: Neurosurgery;  Laterality: N/A;   POSTERIOR CERVICAL LAMINECTOMY N/A 04/30/2023   Procedure: C2-C4 LAMINECTOMY;  Surgeon: Claudene Penne ORN, MD;  Location: [facility];  Service: Neurosurgery;  Laterality: N/A;   SHOULDER ARTHROSCOPY WITH OPEN ROTATOR CUFF REPAIR AND DISTAL CLAVICLE ACROMINECTOMY Left 07/25/2021   Procedure: SHOULDER ARTHROSCOPY WITH OPEN ROTATOR CUFF REPAIR AND DISTAL CLAVICLE ACROMINECTOMY;  Surgeon: Marchia Drivers, MD;  Location: [facility];  Service: Orthopedics;  Laterality: Left;   TOTAL HIP ARTHROPLASTY Left 03/06/2020    Home Medications:  Allergies as of 10/29/2023   No Known Allergies      Medication List  Accurate as of October 29, 2023  3:19 PM. If you have any questions, ask your nurse or doctor.          acetaminophen  325 MG tablet Commonly known as: TYLENOL  Take 2 tablets (650 mg total) by mouth every 4 (four) hours as needed for mild pain (pain score 1-3) (or temp > 100.5).   alfuzosin  10 MG 24 hr tablet Commonly known as: UROXATRAL  TAKE 1 TABLET BY MOUTH DAILY   brimonidine  0.2 %  ophthalmic solution Commonly known as: ALPHAGAN  Place 1 drop into both eyes 2 (two) times daily.   docusate sodium  100 MG capsule Commonly known as: COLACE Take 1 capsule (100 mg total) by mouth 2 (two) times daily.   dorzolamide -timolol  2-0.5 % ophthalmic solution Commonly known as: COSOPT  Place 1 drop into both eyes 2 (two) times daily.   latanoprost  0.005 % ophthalmic solution Commonly known as: XALATAN  Place 1 drop into both eyes at bedtime.   Mounjaro  10 MG/0.5ML Pen Generic drug: tirzepatide  Inject 10 mg into the skin once a week.   nitrofurantoin  (macrocrystal-monohydrate) 100 MG capsule Commonly known as: MACROBID  Take 1 capsule (100 mg total) by mouth daily.   pantoprazole  40 MG tablet Commonly known as: PROTONIX  Take 1 tablet (40 mg total) by mouth daily.   polyethylene glycol powder 17 GM/SCOOP powder Commonly known as: GLYCOLAX /MIRALAX  Take 17 g by mouth daily.   potassium chloride  SA 20 MEQ tablet Commonly known as: KLOR-CON  M Take 1 tablet (20 mEq total) by mouth daily for 2 days.   tadalafil  5 MG tablet Commonly known as: CIALIS  Take 1 tablet (5 mg total) by mouth daily. Patient takes at bedtime Sunday, Wednesday, Friday        Allergies: No Known Allergies  Family History: Family History  Problem Relation Age of Onset   Heart disease Mother    Hypertension Mother    Diabetes Maternal Aunt    Cancer Maternal Uncle        Prostate   Diabetes Paternal Aunt     Social History:  reports that he quit smoking about 4 years ago. His smoking use included cigarettes. He started smoking about 44 years ago. He has a 20 pack-year smoking history. He has never used smokeless tobacco. He reports that he does not currently use drugs after having used the following drugs: Marijuana. He reports that he does not drink alcohol.   Physical Exam: BP 119/75   Pulse 87   Ht 5' 7 (1.702 m)   Wt 190 lb 4.8 oz (86.3 kg)   BMI 29.81 kg/m   Constitutional:   Alert, No acute distress. HEENT: [place] AT Respiratory: Normal respiratory effort, no increased work of breathing. Psychiatric: Normal mood and affect.   Assessment & Plan:    1. Benign prostatic hyperplasia with incomplete bladder emptying  Although he feels he has been voiding much better taking his medications regularly his PVR today was 587 mL.  He declined Foley catheter placement and felt this was secondary to drinking 800 mL of water  prior to his appointment today.  He has requested a follow-up PVR in the next few weeks Prior to cystoscopy with moderate lateral lobe enlargement and prostate volume was 42 cc.  We discussed minimally invasive options of UroLift (performed here) and Rezum, Aquablation (performed [facility]).  TURP was also discussed If he does elect surgery he is interested in a minimally invasive option and will think over Scheduled for follow-up visit for UA and repeat PVR  Glendia JAYSON Barba, MD  [facility] [address], [address] [place], [place] [address] [phone]

## 2023-10-30 ENCOUNTER — Telehealth: Payer: Self-pay | Admitting: Orthopedic Surgery

## 2023-10-30 NOTE — Telephone Encounter (Signed)
Patient's daughter notified and expressed understanding.

## 2023-10-30 NOTE — Telephone Encounter (Signed)
 DOS: 04/30/23 ACDF C6-C7, C2-C4 laminectomies, C2-T2 PSF     Sounds like she needs FMLA papers filled out to take him to appointments. Recommend she discuss with his PCP.

## 2023-10-30 NOTE — Telephone Encounter (Signed)
 Patient's daughter called to schedule patient a follow up and requested an extension of her short tem disability paperwork to be able to take her father to and from appointments. With the patient being this far out from his surgery, would you like to extend this or does he need to be re-evaluated? Please advise.

## 2023-11-15 ENCOUNTER — Other Ambulatory Visit: Payer: Self-pay | Admitting: Orthopedic Surgery

## 2023-11-15 DIAGNOSIS — G959 Disease of spinal cord, unspecified: Secondary | ICD-10-CM

## 2023-11-15 DIAGNOSIS — Z981 Arthrodesis status: Secondary | ICD-10-CM

## 2023-11-15 NOTE — Progress Notes (Signed)
   REFERRING PHYSICIAN:  [facility] [address] [place],  [place] [address]  DOS: 04/30/23 ACDF C6-C7, C2-C4 laminectomies, C2-T2 PSF  HISTORY OF PRESENT ILLNESS:  Last did phone visit with me on 08/08/23 when he had a GI bug and was having weakness in both legs.   Recommended he follow up in the next week for a good exam. He was lost to follow up.   He notes constant weakness in right leg. No right leg pain. No numbness or tingling. He notes constant LBP. No left leg pain or weakness. He notes some swelling in right foot x months.   He has intermittent pain in his neck. He turned and felt a pop a few days ago. No arm pain. He has chronic weakness in both hands. He feels like his walking is better since his surgery.   He has seen PMR for his lumbar spine and he would like to get repeat injections.    PHYSICAL EXAMINATION:  NEUROLOGICAL:  General: In no acute distress.   Awake, alert, oriented to person, place, and time.  Pupils equal round and reactive to light.  Facial tone is symmetric.    Strength: Side Biceps Triceps Deltoid Interossei Grip Wrist Ext. Wrist Flex.  R 5 5 5 5 5 5 5   L 5 5 5 5 5 5 5    Side Iliopsoas Quads Hamstring PF DF EHL  R 5 5 5 5 5 5   L 5 5 5 5 5 5    Reflexes are 3+ and symmetric at the biceps, brachioradialis, patella and achilles.   Hoffman's is absent.  Clonus is not present.   Bilateral upper and lower extremity sensation is intact to light touch.     He is ambulating with a cane, slightly unsteady gait.   He has very mild swelling in right ankle, no pitting edema.    Assessment / Plan: Caleb Wall has subjective complaints of right leg weakness. He has good strength on exam. Treatment options reviewed with patient and following plan made:   - Message to [facility] regarding PT. He is still at [facility] on [address]. Orders sent.  - He will get cervical xrays in next few days and I will message him with results.  - Swelling in  right ankle is not spine mediated. He will f/u with PCP about this. - I sent Whitney a message to call him with f/u appointment. He wants to revisit lumbar injections.  - Keep scheduled f/u with Dr. Claudene in December.   I spent a total of 25 minutes in face-to-face and non-face-to-face activities related to this patient's care today including review of outside records, review of imaging, review of symptoms, physical exam, discussion of differential diagnosis, discussion of treatment options, and documentation.   Advised to contact the office if any questions or concerns arise.   Glade Boys PA-C Dept of Neurosurgery

## 2023-11-18 ENCOUNTER — Encounter: Payer: Self-pay | Admitting: Orthopedic Surgery

## 2023-11-18 ENCOUNTER — Ambulatory Visit: Payer: Medicare (Managed Care) | Admitting: Orthopedic Surgery

## 2023-11-18 VITALS — BP 142/88 | Ht 67.0 in | Wt 186.0 lb

## 2023-11-18 DIAGNOSIS — Z981 Arthrodesis status: Secondary | ICD-10-CM

## 2023-11-18 DIAGNOSIS — R29898 Other symptoms and signs involving the musculoskeletal system: Secondary | ICD-10-CM | POA: Diagnosis not present

## 2023-11-18 DIAGNOSIS — G959 Disease of spinal cord, unspecified: Secondary | ICD-10-CM

## 2023-11-19 ENCOUNTER — Ambulatory Visit: Payer: Medicare (Managed Care) | Admitting: Physician Assistant

## 2023-12-10 ENCOUNTER — Other Ambulatory Visit: Payer: Self-pay | Admitting: Physician Assistant

## 2023-12-10 ENCOUNTER — Ambulatory Visit: Payer: Medicare (Managed Care) | Admitting: Physician Assistant

## 2023-12-10 VITALS — BP 109/66 | HR 85 | Ht 67.0 in | Wt 184.6 lb

## 2023-12-10 DIAGNOSIS — R338 Other retention of urine: Secondary | ICD-10-CM

## 2023-12-10 DIAGNOSIS — N401 Enlarged prostate with lower urinary tract symptoms: Secondary | ICD-10-CM

## 2023-12-10 DIAGNOSIS — Z87440 Personal history of urinary (tract) infections: Secondary | ICD-10-CM | POA: Diagnosis not present

## 2023-12-10 LAB — BLADDER SCAN AMB NON-IMAGING

## 2023-12-10 NOTE — Progress Notes (Signed)
 12/10/2023 3:00 PM   Caleb Wall [birth year] [id number]  CC: Chief Complaint  Patient presents with   Follow-up   Recurrent UTI   HPI: Caleb Wall is a 68 y.o. 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 Epifanio, BPH with incomplete bladder emptying on alfuzosin  and tadalafil  5 mg daily, and nephrolithiasis who presents today for follow-up.   He saw Dr. Twylla last month and they discussed outlet procedures including UroLift, Rezum, Aquablation, and TURP.  Today he reports he continues to take his medication and is tolerating them well.  His nocturia is significantly improved, time 0-1 most nights.  He is asymptomatic of UTI today and overall feels well.  He prefers to defer outlet procedures for now.  He was unable to provide a voided specimen today.  Bladder scan .  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    Other secondary kyphosis, cervical region    Prediabetes    Primary open angle glaucoma (POAG) of left eye    Pseudoarthrosis of cervical spine (HCC)    Sepsis secondary to UTI (HCC)    Spondylosis of cervical spine     Surgical History: Past Surgical History:  Procedure Laterality Date   ANTERIOR CERVICAL DECOMP/DISCECTOMY FUSION N/A 04/30/2023   Procedure: C6-7 ANTERIOR CERVICAL DISCECTOMY AND FUSION;  Surgeon: Claudene Penne ORN, MD;  Location: [facility];  Service: Neurosurgery;  Laterality: N/A;   ANTERIOR CERVICAL DECOMPRESSION/DISCECTOMY FUSION 4 LEVELS N/A 12/26/2020   Procedure: C3-6 ANTERIOR CERVICAL DECOMPRESSION/DISCECTOMY FUSION 3 LEVELS;  Surgeon: Bluford Standing, MD;  Location: [facility];  Service: Neurosurgery;  Laterality: N/A;    BROW LIFT AND BLEPHAROPLASTY Bilateral 12/21/2019   CATARACT EXTRACTION W/ INTRAOCULAR LENS IMPLANT Left 02/05/2019   COLONOSCOPY WITH PROPOFOL  N/A 09/29/2021   Procedure: COLONOSCOPY WITH PROPOFOL ;  Surgeon: Maryruth Ole DASEN, MD;  Location: [facility];  Service: Endoscopy;  Laterality: N/A;   KNEE SURGERY Left 2002   Meniscus tear   LITHOTRIPSY  12/13/2020   MASS EXCISION N/A 10/11/2022   Procedure: EXCISION MASS CHEST WALL;  Surgeon: Tye Millet, DO;  Location: [facility];  Service: General;  Laterality: N/A;   ORIF FEMORAL NECK FRACTURE W/ DHS Left 03/06/2020   POSTERIOR CERVICAL FUSION/FORAMINOTOMY N/A 04/30/2023   Procedure: C2-T2 POSTERIOR SPINAL FUSION;  Surgeon: Claudene Penne ORN, MD;  Location: [facility];  Service: Neurosurgery;  Laterality: N/A;   POSTERIOR CERVICAL LAMINECTOMY N/A 04/30/2023   Procedure: C2-C4 LAMINECTOMY;  Surgeon: Claudene Penne ORN, MD;  Location: [facility];  Service: Neurosurgery;  Laterality: N/A;   SHOULDER ARTHROSCOPY WITH OPEN ROTATOR CUFF REPAIR AND DISTAL CLAVICLE ACROMINECTOMY Left 07/25/2021   Procedure: SHOULDER ARTHROSCOPY WITH OPEN ROTATOR CUFF REPAIR AND DISTAL CLAVICLE ACROMINECTOMY;  Surgeon: Marchia Drivers, MD;  Location: [facility];  Service: Orthopedics;  Laterality: Left;   TOTAL HIP ARTHROPLASTY Left 03/06/2020    Home Medications:  Allergies as of 12/10/2023   No Known Allergies      Medication List        Accurate as of December 10, 2023  3:00 PM. If you have any questions, ask your nurse  or doctor.          acetaminophen  325 MG tablet Commonly known as: TYLENOL  Take 2 tablets (650 mg total) by mouth every 4 (four) hours as needed for mild pain (pain score 1-3) (or temp > 100.5).   alfuzosin  10 MG 24 hr tablet Commonly known as: UROXATRAL  TAKE 1 TABLET BY MOUTH DAILY   brimonidine  0.2 % ophthalmic solution Commonly known as: ALPHAGAN  Place 1 drop into both eyes 2 (two) times daily.   docusate sodium  100 MG  capsule Commonly known as: COLACE Take 1 capsule (100 mg total) by mouth 2 (two) times daily.   dorzolamide -timolol  2-0.5 % ophthalmic solution Commonly known as: COSOPT  Place 1 drop into both eyes 2 (two) times daily.   latanoprost  0.005 % ophthalmic solution Commonly known as: XALATAN  Place 1 drop into both eyes at bedtime.   nitrofurantoin  (macrocrystal-monohydrate) 100 MG capsule Commonly known as: MACROBID  Take 1 capsule (100 mg total) by mouth daily.   pantoprazole  40 MG tablet Commonly known as: PROTONIX  Take 1 tablet (40 mg total) by mouth daily.   polyethylene glycol powder 17 GM/SCOOP powder Commonly known as: GLYCOLAX /MIRALAX  Take 17 g by mouth daily.   potassium chloride  SA 20 MEQ tablet Commonly known as: KLOR-CON  M Take 1 tablet (20 mEq total) by mouth daily for 2 days.   tadalafil  5 MG tablet Commonly known as: CIALIS  Take 1 tablet (5 mg total) by mouth daily. Patient takes at bedtime Sunday, Wednesday, Friday        Allergies:  No Known Allergies  Family History: Family History  Problem Relation Age of Onset   Heart disease Mother    Hypertension Mother    Diabetes Maternal Aunt    Cancer Maternal Uncle        Prostate   Diabetes Paternal Aunt     Social History:   reports that he quit smoking about 4 years ago. His smoking use included cigarettes. He started smoking about 44 years ago. He has a 20 pack-year smoking history. He has never used smokeless tobacco. He reports that he does not currently use drugs after having used the following drugs: Marijuana. He reports that he does not drink alcohol.  Physical Exam: BP 109/66 (BP Location: Left Arm, Patient Position: Sitting, Cuff Size: Normal)   Pulse 85   Ht 5' 7 (1.702 m)   Wt 184 lb 9.6 oz (83.7 kg)   SpO2 99%   BMI 28.91 kg/m   Constitutional:  Alert and oriented, no acute distress, nontoxic appearing HEENT: [place], AT Cardiovascular: No clubbing, cyanosis, or edema Respiratory: Normal  respiratory effort, no increased work of breathing Skin: No rashes, bruises or suspicious lesions Neurologic: Grossly intact, no focal deficits, moving all 4 extremities Psychiatric: Normal mood and affect  Laboratory Data: Results for orders placed or performed in visit on 12/10/23  Bladder Scan (Post Void Residual) in office   Collection Time: 12/10/23  2:50 PM  Result Value Ref Range   Scan Result    Assessment & Plan:   1. Benign prostatic hyperplasia with incomplete bladder emptying (Primary) Residual improved compared to prior, though remains elevated.  He prefers to defer outlet procedures, which is reasonable.  Will see him back in 6 months and continue to monitor him closely and revisit the plans for outlet procedure if things worsen over time. - Bladder Scan (Post Void Residual) in office   Return in about 6 months (around 06/09/2024) for UA, IPSS, PVR.  Ambre Kobayashi, PA-C  [facility] [address], [address] [place], [place] [address] [phone]

## 2023-12-27 ENCOUNTER — Other Ambulatory Visit: Payer: Self-pay | Admitting: Physician Assistant

## 2023-12-27 DIAGNOSIS — N401 Enlarged prostate with lower urinary tract symptoms: Secondary | ICD-10-CM

## 2024-01-20 ENCOUNTER — Ambulatory Visit: Payer: Medicare (Managed Care) | Admitting: Neurosurgery

## 2024-01-20 ENCOUNTER — Other Ambulatory Visit: Payer: Medicare (Managed Care)

## 2024-01-21 ENCOUNTER — Ambulatory Visit: Payer: Medicare (Managed Care) | Admitting: Urology

## 2024-02-05 ENCOUNTER — Ambulatory Visit: Payer: Medicare (Managed Care) | Admitting: Urology

## 2024-02-26 ENCOUNTER — Ambulatory Visit: Payer: Medicare (Managed Care)

## 2024-02-26 ENCOUNTER — Ambulatory Visit: Payer: Medicare (Managed Care) | Admitting: Neurosurgery

## 2024-02-26 ENCOUNTER — Encounter: Payer: Self-pay | Admitting: Neurosurgery

## 2024-02-26 VITALS — BP 102/58 | Ht 67.0 in | Wt 172.0 lb

## 2024-02-26 DIAGNOSIS — R29898 Other symptoms and signs involving the musculoskeletal system: Secondary | ICD-10-CM | POA: Diagnosis not present

## 2024-02-26 DIAGNOSIS — Z981 Arthrodesis status: Secondary | ICD-10-CM

## 2024-02-26 DIAGNOSIS — G959 Disease of spinal cord, unspecified: Secondary | ICD-10-CM | POA: Diagnosis not present

## 2024-02-26 NOTE — Progress Notes (Signed)
 "  REFERRING PHYSICIAN:  [facility] [address] [place],  [place] [address]  DOS: 04/30/23 ACDF C6-C7, C2-C4 laminectomies, C2-T2 PSF  Discussed the use of AI scribe software for clinical note transcription with the patient, who gave verbal consent to proceed.  History of Present Illness Caleb Wall is a 69 year old male with recent cervical and thoracic spine surgery for spinal cord injury who presents for postoperative evaluation of neck symptoms and lower extremity numbness. He notes intermittent mechanical sensations in the cervical region and can palpate hardware beneath the skin, without new cervical pain. About two weeks ago he fell, twisting his ankle, and developed focal numbness in the foot without pain. He was unable to bear weight for several days due to numbness, which has gradually improved, though some numbness persists. He reports similar prior transient episodes. He reports significant intentional weight loss over two to three months while on Mounjaro , which he stopped due to anorexia and fatigue.  Postoperatively he progressed from using a cane to independent walking and has resumed activities such as working on his car and lawnmower. He describes the first two to three months as difficult but now notes overall functional improvement and denies new weakness in the upper or lower extremities.  PHYSICAL EXAMINATION:  NEUROLOGICAL:  General: In no acute distress.   Awake, alert, oriented to person, place, and time.  Pupils equal round and reactive to light.  Facial tone is symmetric.    Strength: Side Biceps Triceps Deltoid Interossei Grip Wrist Ext. Wrist Flex.  R 5 5 5 5 5 5 5   L 5 5 5 5 5 5 5    Side Iliopsoas Quads Hamstring PF DF EHL  R 5 5 5 5 5 5   L 5 5 5 5 5 5    Reflexes are 3+ and symmetric at the biceps, brachioradialis, patella and achilles.   Hoffman's is absent.  Clonus is not present.   Bilateral upper and lower extremity sensation is intact to  light touch.     He is ambulating with a cane, slightly unsteady gait.   He has very mild swelling in right ankle, no pitting edema.   X-rays pending final read.  No major complications noted.  Assessment and Plan Assessment & Plan Postoperative care following cervical and thoracic spine surgery fusion He is recovering from ACDF C6-C7, C2-C4 laminectomies, and C2-T2 posterior spinal fusion. Incision is well-healed. Palpable hardware and intermittent popping are attributed to his thin body habitus and are not concerning in the absence of new pain or neurological symptoms. Cervical and thoracic spine x-rays today showed no evidence of hardware loosening, fracture, or malalignment. Fusion appears to be healing appropriately, though definitive assessment would require advanced imaging. Recent fall did not result in new or worsening symptoms related to the surgical site. No evidence of surgical complication at this time. - Reviewed cervical and thoracic spine x-rays; no evidence of hardware loosening, fracture, or malalignment. - Will await official radiology report for confirmation of imaging findings. - Provided reassurance regarding palpable hardware and intermittent popping sensations. - Advised continued recovery and avoidance of further falls. - Will contact him if radiology identifies any concerning findings.  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 These symptoms are likely related to ongoing spinal cord healing. He has made functional gains, including increased mobility and discontinuation of double cane use. - Discussed that intermittent  numbness and paresthesias may be related to spinal cord healing. - Encouraged continued gradual increase in activity as tolerated. - Provided reassurance regarding recovery trajectory.  Penne MICAEL Sharps,  MD Department of Neurosurgery  "

## 2024-03-05 ENCOUNTER — Ambulatory Visit: Payer: Self-pay | Admitting: Neurosurgery

## 2024-03-25 ENCOUNTER — Encounter: Admission: RE | Payer: Self-pay | Source: Home / Self Care

## 2024-03-25 ENCOUNTER — Ambulatory Visit: Admission: RE | Admit: 2024-03-25 | Payer: Medicare (Managed Care) | Source: Home / Self Care

## 2024-03-25 DIAGNOSIS — R9430 Abnormal result of cardiovascular function study, unspecified: Secondary | ICD-10-CM

## 2024-04-03 ENCOUNTER — Ambulatory Visit: Admit: 2024-04-03 | Payer: Medicare (Managed Care) | Admitting: Internal Medicine

## 2024-04-03 DIAGNOSIS — R9430 Abnormal result of cardiovascular function study, unspecified: Secondary | ICD-10-CM

## 2024-06-09 ENCOUNTER — Ambulatory Visit: Payer: Medicare (Managed Care) | Admitting: Physician Assistant
